# Patient Record
Sex: Female | Born: 1955
Health system: Southern US, Community
[De-identification: ages and names within clinical notes are randomized; demographics above are authoritative.]

## PROBLEM LIST (undated history)

## (undated) DIAGNOSIS — M545 Low back pain, unspecified: Secondary | ICD-10-CM

## (undated) DIAGNOSIS — H269 Unspecified cataract: Secondary | ICD-10-CM

## (undated) DIAGNOSIS — R519 Headache, unspecified: Secondary | ICD-10-CM

## (undated) DIAGNOSIS — F40240 Claustrophobia: Secondary | ICD-10-CM

## (undated) DIAGNOSIS — R7303 Prediabetes: Secondary | ICD-10-CM

## (undated) DIAGNOSIS — M1990 Unspecified osteoarthritis, unspecified site: Secondary | ICD-10-CM

## (undated) DIAGNOSIS — G8929 Other chronic pain: Secondary | ICD-10-CM

## (undated) DIAGNOSIS — I10 Essential (primary) hypertension: Secondary | ICD-10-CM

## (undated) DIAGNOSIS — E7800 Pure hypercholesterolemia, unspecified: Secondary | ICD-10-CM

## (undated) DIAGNOSIS — R011 Cardiac murmur, unspecified: Secondary | ICD-10-CM

## (undated) DIAGNOSIS — H35039 Hypertensive retinopathy, unspecified eye: Secondary | ICD-10-CM

## (undated) DIAGNOSIS — R51 Headache: Secondary | ICD-10-CM

## (undated) DIAGNOSIS — K219 Gastro-esophageal reflux disease without esophagitis: Secondary | ICD-10-CM

## (undated) HISTORY — PX: VAGINAL HYSTERECTOMY: SUR661

## (undated) HISTORY — PX: CATARACT EXTRACTION: SUR2

## (undated) HISTORY — PX: CARPAL TUNNEL RELEASE: SHX101

## (undated) HISTORY — PX: BREAST BIOPSY: SHX20

## (undated) HISTORY — PX: BREAST EXCISIONAL BIOPSY: SUR124

## (undated) HISTORY — PX: BREAST CYST EXCISION: SHX579

## (undated) HISTORY — PX: COLONOSCOPY W/ BIOPSIES AND POLYPECTOMY: SHX1376

## (undated) HISTORY — DX: Hypertensive retinopathy, unspecified eye: H35.039

---

## 1998-03-22 ENCOUNTER — Encounter: Admission: RE | Admit: 1998-03-22 | Discharge: 1998-03-22 | Payer: Self-pay | Admitting: Sports Medicine

## 1998-06-06 ENCOUNTER — Encounter: Admission: RE | Admit: 1998-06-06 | Discharge: 1998-06-06 | Payer: Self-pay | Admitting: Family Medicine

## 2000-12-31 ENCOUNTER — Encounter: Admission: RE | Admit: 2000-12-31 | Discharge: 2000-12-31 | Payer: Self-pay | Admitting: Family Medicine

## 2001-07-23 ENCOUNTER — Ambulatory Visit (HOSPITAL_COMMUNITY): Admission: RE | Admit: 2001-07-23 | Discharge: 2001-07-23 | Payer: Self-pay

## 2002-06-12 ENCOUNTER — Emergency Department (HOSPITAL_COMMUNITY): Admission: EM | Admit: 2002-06-12 | Discharge: 2002-06-12 | Payer: Self-pay | Admitting: *Deleted

## 2003-01-24 ENCOUNTER — Emergency Department (HOSPITAL_COMMUNITY): Admission: EM | Admit: 2003-01-24 | Discharge: 2003-01-24 | Payer: Self-pay | Admitting: *Deleted

## 2003-01-24 ENCOUNTER — Encounter: Payer: Self-pay | Admitting: *Deleted

## 2003-03-24 ENCOUNTER — Emergency Department (HOSPITAL_COMMUNITY): Admission: EM | Admit: 2003-03-24 | Discharge: 2003-03-24 | Payer: Self-pay | Admitting: Emergency Medicine

## 2003-08-28 ENCOUNTER — Encounter: Admission: RE | Admit: 2003-08-28 | Discharge: 2003-08-28 | Payer: Self-pay | Admitting: Family Medicine

## 2003-10-11 ENCOUNTER — Encounter: Admission: RE | Admit: 2003-10-11 | Discharge: 2003-10-11 | Payer: Self-pay | Admitting: Family Medicine

## 2003-10-16 ENCOUNTER — Encounter: Admission: RE | Admit: 2003-10-16 | Discharge: 2003-10-16 | Payer: Self-pay | Admitting: Sports Medicine

## 2003-11-30 ENCOUNTER — Emergency Department (HOSPITAL_COMMUNITY): Admission: EM | Admit: 2003-11-30 | Discharge: 2003-11-30 | Payer: Self-pay | Admitting: Emergency Medicine

## 2003-12-25 ENCOUNTER — Emergency Department (HOSPITAL_COMMUNITY): Admission: EM | Admit: 2003-12-25 | Discharge: 2003-12-25 | Payer: Self-pay | Admitting: Emergency Medicine

## 2004-03-10 ENCOUNTER — Emergency Department (HOSPITAL_COMMUNITY): Admission: EM | Admit: 2004-03-10 | Discharge: 2004-03-10 | Payer: Self-pay | Admitting: Emergency Medicine

## 2004-07-20 ENCOUNTER — Encounter: Admission: RE | Admit: 2004-07-20 | Discharge: 2004-07-20 | Payer: Self-pay | Admitting: Orthopedic Surgery

## 2004-07-21 ENCOUNTER — Emergency Department (HOSPITAL_COMMUNITY): Admission: EM | Admit: 2004-07-21 | Discharge: 2004-07-21 | Payer: Self-pay | Admitting: Emergency Medicine

## 2004-08-26 ENCOUNTER — Encounter: Admission: RE | Admit: 2004-08-26 | Discharge: 2004-09-27 | Payer: Self-pay | Admitting: Neurosurgery

## 2004-09-12 ENCOUNTER — Ambulatory Visit: Payer: Self-pay | Admitting: Family Medicine

## 2004-10-06 ENCOUNTER — Encounter (INDEPENDENT_AMBULATORY_CARE_PROVIDER_SITE_OTHER): Payer: Self-pay | Admitting: *Deleted

## 2004-10-08 ENCOUNTER — Ambulatory Visit: Payer: Self-pay | Admitting: Family Medicine

## 2004-10-08 ENCOUNTER — Ambulatory Visit (HOSPITAL_COMMUNITY): Admission: RE | Admit: 2004-10-08 | Discharge: 2004-10-08 | Payer: Self-pay | Admitting: Family Medicine

## 2004-11-11 ENCOUNTER — Ambulatory Visit: Payer: Self-pay | Admitting: Family Medicine

## 2004-11-20 ENCOUNTER — Ambulatory Visit (HOSPITAL_COMMUNITY): Admission: RE | Admit: 2004-11-20 | Discharge: 2004-11-20 | Payer: Self-pay | Admitting: Internal Medicine

## 2004-12-20 ENCOUNTER — Ambulatory Visit: Payer: Self-pay | Admitting: Internal Medicine

## 2005-05-01 ENCOUNTER — Ambulatory Visit: Payer: Self-pay | Admitting: Family Medicine

## 2005-05-07 ENCOUNTER — Encounter: Admission: RE | Admit: 2005-05-07 | Discharge: 2005-05-07 | Payer: Self-pay | Admitting: Sports Medicine

## 2005-08-07 ENCOUNTER — Ambulatory Visit (HOSPITAL_COMMUNITY): Admission: RE | Admit: 2005-08-07 | Discharge: 2005-08-07 | Payer: Self-pay | Admitting: Family Medicine

## 2005-08-07 ENCOUNTER — Ambulatory Visit: Payer: Self-pay | Admitting: Family Medicine

## 2005-09-01 ENCOUNTER — Ambulatory Visit: Payer: Self-pay | Admitting: Family Medicine

## 2005-10-13 ENCOUNTER — Encounter: Admission: RE | Admit: 2005-10-13 | Discharge: 2005-10-13 | Payer: Self-pay | Admitting: Sports Medicine

## 2005-12-23 ENCOUNTER — Ambulatory Visit: Payer: Self-pay | Admitting: Family Medicine

## 2006-04-29 ENCOUNTER — Ambulatory Visit: Payer: Self-pay | Admitting: Sports Medicine

## 2006-12-03 DIAGNOSIS — N951 Menopausal and female climacteric states: Secondary | ICD-10-CM

## 2006-12-03 DIAGNOSIS — E7800 Pure hypercholesterolemia, unspecified: Secondary | ICD-10-CM | POA: Insufficient documentation

## 2006-12-03 DIAGNOSIS — M545 Low back pain: Secondary | ICD-10-CM

## 2006-12-03 DIAGNOSIS — I10 Essential (primary) hypertension: Secondary | ICD-10-CM

## 2006-12-03 DIAGNOSIS — F339 Major depressive disorder, recurrent, unspecified: Secondary | ICD-10-CM | POA: Insufficient documentation

## 2006-12-04 ENCOUNTER — Encounter (INDEPENDENT_AMBULATORY_CARE_PROVIDER_SITE_OTHER): Payer: Self-pay | Admitting: *Deleted

## 2007-02-25 ENCOUNTER — Encounter: Admission: RE | Admit: 2007-02-25 | Discharge: 2007-02-25 | Payer: Self-pay | Admitting: Internal Medicine

## 2007-05-07 HISTORY — PX: COLON SURGERY: SHX602

## 2007-05-26 ENCOUNTER — Inpatient Hospital Stay (HOSPITAL_COMMUNITY): Admission: RE | Admit: 2007-05-26 | Discharge: 2007-05-31 | Payer: Self-pay | Admitting: General Surgery

## 2007-05-26 ENCOUNTER — Encounter (HOSPITAL_BASED_OUTPATIENT_CLINIC_OR_DEPARTMENT_OTHER): Payer: Self-pay | Admitting: General Surgery

## 2008-05-24 ENCOUNTER — Encounter: Admission: RE | Admit: 2008-05-24 | Discharge: 2008-05-24 | Payer: Self-pay | Admitting: Internal Medicine

## 2009-02-06 ENCOUNTER — Ambulatory Visit (HOSPITAL_COMMUNITY): Admission: RE | Admit: 2009-02-06 | Discharge: 2009-02-06 | Payer: Self-pay | Admitting: Neurosurgery

## 2009-06-07 ENCOUNTER — Encounter: Admission: RE | Admit: 2009-06-07 | Discharge: 2009-06-07 | Payer: Self-pay | Admitting: Internal Medicine

## 2010-06-24 ENCOUNTER — Encounter: Admission: RE | Admit: 2010-06-24 | Discharge: 2010-06-24 | Payer: Self-pay | Admitting: Internal Medicine

## 2010-12-23 ENCOUNTER — Emergency Department (HOSPITAL_COMMUNITY): Payer: Medicare Other

## 2010-12-23 ENCOUNTER — Emergency Department (HOSPITAL_COMMUNITY)
Admission: EM | Admit: 2010-12-23 | Discharge: 2010-12-23 | Disposition: A | Discharge status: Home / Self Care | Payer: Medicare Other | Attending: Emergency Medicine | Admitting: Emergency Medicine

## 2010-12-23 DIAGNOSIS — K219 Gastro-esophageal reflux disease without esophagitis: Secondary | ICD-10-CM | POA: Insufficient documentation

## 2010-12-23 DIAGNOSIS — I10 Essential (primary) hypertension: Secondary | ICD-10-CM | POA: Insufficient documentation

## 2010-12-23 DIAGNOSIS — Z79899 Other long term (current) drug therapy: Secondary | ICD-10-CM | POA: Insufficient documentation

## 2010-12-23 DIAGNOSIS — M25519 Pain in unspecified shoulder: Secondary | ICD-10-CM | POA: Insufficient documentation

## 2010-12-23 DIAGNOSIS — M79609 Pain in unspecified limb: Secondary | ICD-10-CM | POA: Insufficient documentation

## 2010-12-23 DIAGNOSIS — F329 Major depressive disorder, single episode, unspecified: Secondary | ICD-10-CM | POA: Insufficient documentation

## 2010-12-23 DIAGNOSIS — M2560 Stiffness of unspecified joint, not elsewhere classified: Secondary | ICD-10-CM | POA: Insufficient documentation

## 2010-12-23 DIAGNOSIS — F3289 Other specified depressive episodes: Secondary | ICD-10-CM | POA: Insufficient documentation

## 2011-01-15 LAB — BASIC METABOLIC PANEL
BUN: 9 mg/dL (ref 6–23)
CO2: 28 mEq/L (ref 19–32)
Creatinine, Ser: 0.61 mg/dL (ref 0.4–1.2)
GFR calc non Af Amer: >60 mL/min (ref >60)

## 2011-01-15 LAB — CBC
HCT: 38 % (ref 36.0–46.0)
Platelets: 291 10*3/uL (ref 150–400)
RBC: 3.93 MIL/uL (ref 3.87–5.11)

## 2011-02-18 NOTE — Op Note (Signed)
Annette Rios, Annette Rios              ACCOUNT NO.:  [id number]   MEDICAL RECORD NO.:  [id number]          PATIENT TYPE:  AMB   LOCATION:  SDS                          FACILITY:  [facility]   PHYSICIAN:  Danae Orleans. Venetia Maxon, M.D.  DATE OF BIRTH:  1955/10/24   DATE OF PROCEDURE:  02/06/2009  DATE OF DISCHARGE:                               OPERATIVE REPORT   PREOPERATIVE DIAGNOSIS:  Right carpal tunnel syndrome.   POSTOPERATIVE DIAGNOSIS:  Right carpal tunnel syndrome.   PROCEDURE:  Right carpal tunnel release.   SURGEON:  Danae Orleans. Venetia Maxon, MD   ANESTHESIA:  Laryngeal mask anesthesia with local lidocaine.   ESTIMATED BLOOD LOSS:  Minimal.   COMPLICATIONS:  None.   DISPOSITION:  Recovery.   INDICATIONS:  [name] is a 55 year old woman with EMG and nerve  conduction velocity documented carpal tunnel syndrome on the right.  It  was elected to take her to Surgery for carpal tunnel release following  failure to improve with conservative measures.   PROCEDURE IN DETAIL:  [name] was brought to the operating room.  She was placed under LMA anesthesia per her own request.  I had  recommended local lidocaine and intravenous sedation.  Following  satisfactory and uncomplicated induction of general anesthesia, the  patient's right arm and hand were prepped and draped in the usual  sterile fashion with Betadine scrub and paint and sterile stockinette  was applied with extremity drape.  Her right incision was marked on the  right volar wrist at the level of the fourth ray to the most distal  wrist crease over a length of 2 cm.  Skin and subcutaneous tissues were  infiltrated with local lidocaine.  Incision was made and carried through  subcutaneous fat to expose the flexor retinaculum.  This was incised  sharply with #15 blade and then incised distally into the palm with  tenotomy scissors and then more proximally into the volar wrist and it  was felt to be well-decompressed.  The wound  was then irrigated.  Soft  tissue and neural elements were inspected and found to be indeed  repaired.  The incision was closed with interrupted 3-0 vertical  mattress nylon  stitches dressed with bacitracin, Telfa, Kerlix fluff, Kerlix and Kling  wrap and the patient was extubated in the operating room and taken to  the recovery in stable and satisfactory condition having tolerated her  operation well.  Counts were correct at the end of the case.      Danae Orleans. Venetia Maxon, M.D.  Electronically Signed     JDS/MEDQ  D:  02/06/2009  T:  02/06/2009  Job:  [id number]

## 2011-02-18 NOTE — Op Note (Signed)
Annette Rios, Annette Rios              ACCOUNT NO.:  [id number]   MEDICAL RECORD NO.:  [id number]          PATIENT TYPE:  INP   LOCATION:  5706                         FACILITY:  [facility]   PHYSICIAN:  Leonie Man, M.D.   DATE OF BIRTH:  March 09, 1956   DATE OF PROCEDURE:  05/26/2007  DATE OF DISCHARGE:                               OPERATIVE REPORT   PREOPERATIVE DIAGNOSIS:  Polyp of midascending colon incompletely  excised, rule out carcinoma.   POSTOPERATIVE DIAGNOSIS:  Polyp of midascending colon incompletely  excised, rule out carcinoma.   PROCEDURE:  Right hemicolectomy.   SURGEON:  Leonie Man, M.D.   ASSISTANT:  Angelia Mould. Derrell Lolling, M.D.   ANESTHESIA:  General.   SPECIMENS TO LAB:  Right colon and distal ileum.   ESTIMATED BLOOD LOSS:  Minimal.   COMPLICATIONS:  None apparent.   The patient removed to the PACU in excellent condition.   NOTE:  The patient is a 55 year old female presenting with severe  history of constipation.  She underwent screening colonoscopy.  At that  time she was noted to have three areas of polypoid formation in the  right colon, all of which were biopsied and noted to be adenomatous  polyps; however, in discussion with the endoscopists, one of these  polyps was incompletely excised and the patient is referred for surgical  evaluation.  The patient comes to the operating room now after the risks  and potential benefits of right hemicolectomy have been fully discussed.  All questions answered and consent obtained for surgery.   IDENTIFYING INFORMATION:  Following induction of satisfactory anesthesia  with the patient positioned supinely, the abdomen is prepped and draped  to be included in a sterile operative field and a Foley catheter was  placed in the urinary bladder.  The patient is positively identified as  Annette Rios and the operation to be performed right hemicolectomy.   A transverse incision is made at the level of the umbilicus,  deepened  through skin and subcutaneous tissues across the anterior rectus fascia.  The rectus muscle was divided and the posterior rectus fascia and the  posterior rectus sheath and peritoneum were opened.  Exploration of the  abdomen carried out.  There was no evidence of seeding in any portion of  the abdomen.  The liver was smooth and normal.  The area of polypoid  malformation had been previously tattooed with [place] ink and could be  clearly seen.  Dissection carried down in the region of the cecum,  releasing the retroperitoneal attachments of both the cecum and the  distal ileum and carrying this dissection up along the right paracolic  gutter up to the hepatic flexure.  The hepatic flexure was taken down  with the LigaSure stapling device and carried all the way to the mid  middle colic vessels.  The entire right colonic mesentery was mobilized  from the retroperitoneum protecting the duodenum, the ureter and the  kidney.  Both the distal ileum was transected and just proximal to the  fold of Treves and the mid transverse colon was divided just proximal to  the  middle colic vessels.  This was done with a GIA stapling device and  the intervening mesentery was then taken with the LigaSure device  carried down to the ileocolic vessels which were secured with 2-0 silk  ties.  Entire specimen was then removed and forwarded for pathologic  evaluation.  A functional end-to-end anastomosis was then carried out  between the distal ileum and the mid transverse colon using a GIA  stapler.  The staple line noted to be intact.  The anastomosis was  closed with a TA60 stapling device.  The mesenteric defect was closed  with interrupted 3-0 Vicryl sutures.  All areas of dissection were then  checked for hemostasis noted to be dry.  Sponge and instrument and sharp  counts were doubly verified and the wound closed in layers as follows.  The posterior rectus sheath and peritoneum closed with running  #0  Novofil suture.  The anterior rectus sheath and midline closed with a  running double-stranded #1 Novofil suture.  Subcutaneous tissues are  irrigated and the skin closed with staples.  Sterile dressings were  applied.  The anesthetic reversed and the patient removed from the  operating room to the recovery room in stable condition.  She tolerated  the procedure well.      Leonie Man, M.D.  Electronically Signed     PB/MEDQ  D:  05/26/2007  T:  05/27/2007  Job:  [id number]

## 2011-02-18 NOTE — Discharge Summary (Signed)
NAMECHALLIS, CRILL              ACCOUNT NO.:  [id number]   MEDICAL RECORD NO.:  [id number]          PATIENT TYPE:  INP   LOCATION:  5706                         FACILITY:  [facility]   PHYSICIAN:  Leonie Man, M.D.   DATE OF BIRTH:  December 09, 1955   DATE OF ADMISSION:  05/26/2007  DATE OF DISCHARGE:                               DISCHARGE SUMMARY   ADMISSION DIAGNOSIS:  Incompletely excised tubulovillous adenoma of the  mid ascending colon.   DISCHARGE DIAGNOSIS:  Incompletely excised tubulovillous adenoma of the  mid ascending colon.   PROCEDURES IN-HOSPITAL:  Right hemicolectomy with ileocolic anastomosis.   COMPLICATIONS:  None.   CONDITION ON DISCHARGE:  Improved.   HISTORY AND HOSPITAL COURSE:  The patient is a 55 year old female  presenting originally with constipation.  She underwent a colonoscopy  and found to have three areas of tubulovillous adenoma in the ascending  colon.  These were excised.  The largest one in the mid ascending colon  was felt to be incompletely excised.  The patient was subsequently  referred for definitive surgery to rule out carcinoma.  On preoperative  evaluation, the patient noted to have fairly well-controlled  hypertension.  She has a history of depression which was controlled on  SSRIs, problems with low-back pain on Flexeril.  On the day of admission  she went to the operating room where she underwent right-sided  hemicolectomy.  Her postoperative course has been benign with normal  resumption of diet and activity.  Today on the day of discharge she is  feeling well.  She is tolerating a regular diet and having normal bowel  activity.  Her incision is healing satisfactorily and the staples were  removed and the wound was steri-stripped on the day of discharge.  She  is now being discharged to be followed up in the office in 2-3 weeks.   DISCHARGE MEDICATION:  Percocet 10/650 one to two every 4-6 hours p.r.n.   The patient is advised to  continue her usual home medications which  include:  1. Hydrochlorothiazide.  2. Fluoxetine.  3. Nexium,.  4. Low-dose aspirin.  5. Cymbalta.  6. Lipitor.  7. Celebrex.  8. Flexeril.   ACTIVITY:  As tolerated.  She is asked not to lift any weight heavier  than 20 pounds for the next 6 weeks.   DIET:  Unrestricted.      Leonie Man, M.D.  Electronically Signed     PB/MEDQ  D:  05/31/2007  T:  05/31/2007  Job:  [id number]

## 2011-06-25 ENCOUNTER — Other Ambulatory Visit: Payer: Self-pay | Admitting: Internal Medicine

## 2011-06-25 DIAGNOSIS — Z1231 Encounter for screening mammogram for malignant neoplasm of breast: Secondary | ICD-10-CM

## 2011-07-03 ENCOUNTER — Ambulatory Visit
Admission: RE | Admit: 2011-07-03 | Discharge: 2011-07-03 | Disposition: A | Discharge status: Home / Self Care | Payer: Medicaid Other | Source: Ambulatory Visit | Attending: Internal Medicine | Admitting: Internal Medicine

## 2011-07-03 DIAGNOSIS — Z1231 Encounter for screening mammogram for malignant neoplasm of breast: Secondary | ICD-10-CM

## 2011-07-18 LAB — CEA: CEA: <0.5

## 2011-07-18 LAB — DIFFERENTIAL
Basophils Absolute: 0
Eosinophils Absolute: 0
Eosinophils Relative: 0
Lymphocytes Relative: 36
Lymphs Abs: 2.6
Monocytes Absolute: 0.3
Monocytes Relative: 5
Neutro Abs: 4.2

## 2011-07-18 LAB — BASIC METABOLIC PANEL
BUN: 3 — ABNORMAL LOW
CO2: 28
Calcium: 8 — ABNORMAL LOW
Calcium: 8.2 — ABNORMAL LOW
Calcium: 8.6
Chloride: 100
Creatinine, Ser: 0.56
GFR calc non Af Amer: >60
GFR calc non Af Amer: >60
Glucose, Bld: 108 — ABNORMAL HIGH
Glucose, Bld: 99
Potassium: 3 — ABNORMAL LOW
Potassium: 3.6
Sodium: 132 — ABNORMAL LOW
Sodium: 135
Sodium: 137

## 2011-07-18 LAB — CBC
HCT: 31.8 — ABNORMAL LOW
Hemoglobin: 10.9 — ABNORMAL LOW
Hemoglobin: 11 — ABNORMAL LOW
MCHC: 34
MCHC: 34
MCHC: 34.3
MCHC: 34.6
Platelets: 308
Platelets: 311
RDW: 13.9
WBC: 11.7 — ABNORMAL HIGH

## 2011-07-18 LAB — COMPREHENSIVE METABOLIC PANEL
AST: 19
Albumin: 3.2 — ABNORMAL LOW
Alkaline Phosphatase: 38 — ABNORMAL LOW
BUN: 4 — ABNORMAL LOW
BUN: 6
Calcium: 9.6
Chloride: 100
Chloride: 103
Sodium: 136

## 2011-07-18 LAB — PROTIME-INR
INR: 0.9
Prothrombin Time: 12.8

## 2011-07-18 LAB — ABO/RH: ABO/RH(D): O POS

## 2011-07-18 LAB — TYPE AND SCREEN: Antibody Screen: NEGATIVE

## 2011-10-29 DIAGNOSIS — E785 Hyperlipidemia, unspecified: Secondary | ICD-10-CM | POA: Diagnosis not present

## 2011-10-29 DIAGNOSIS — Z131 Encounter for screening for diabetes mellitus: Secondary | ICD-10-CM | POA: Diagnosis not present

## 2011-10-29 DIAGNOSIS — M67919 Unspecified disorder of synovium and tendon, unspecified shoulder: Secondary | ICD-10-CM | POA: Diagnosis not present

## 2011-10-29 DIAGNOSIS — I10 Essential (primary) hypertension: Secondary | ICD-10-CM | POA: Diagnosis not present

## 2011-11-12 DIAGNOSIS — F330 Major depressive disorder, recurrent, mild: Secondary | ICD-10-CM | POA: Diagnosis not present

## 2012-02-11 DIAGNOSIS — Z862 Personal history of diseases of the blood and blood-forming organs and certain disorders involving the immune mechanism: Secondary | ICD-10-CM | POA: Diagnosis not present

## 2012-02-11 DIAGNOSIS — R7309 Other abnormal glucose: Secondary | ICD-10-CM | POA: Diagnosis not present

## 2012-02-11 DIAGNOSIS — Z8639 Personal history of other endocrine, nutritional and metabolic disease: Secondary | ICD-10-CM | POA: Diagnosis not present

## 2012-02-11 DIAGNOSIS — I10 Essential (primary) hypertension: Secondary | ICD-10-CM | POA: Diagnosis not present

## 2012-02-11 DIAGNOSIS — E785 Hyperlipidemia, unspecified: Secondary | ICD-10-CM | POA: Diagnosis not present

## 2012-03-24 DIAGNOSIS — F330 Major depressive disorder, recurrent, mild: Secondary | ICD-10-CM | POA: Diagnosis not present

## 2012-05-17 DIAGNOSIS — I10 Essential (primary) hypertension: Secondary | ICD-10-CM | POA: Diagnosis not present

## 2012-05-17 DIAGNOSIS — E785 Hyperlipidemia, unspecified: Secondary | ICD-10-CM | POA: Diagnosis not present

## 2012-05-17 DIAGNOSIS — R7309 Other abnormal glucose: Secondary | ICD-10-CM | POA: Diagnosis not present

## 2012-05-17 DIAGNOSIS — Z862 Personal history of diseases of the blood and blood-forming organs and certain disorders involving the immune mechanism: Secondary | ICD-10-CM | POA: Diagnosis not present

## 2012-07-22 ENCOUNTER — Other Ambulatory Visit: Payer: Self-pay | Admitting: Internal Medicine

## 2012-07-22 DIAGNOSIS — Z1231 Encounter for screening mammogram for malignant neoplasm of breast: Secondary | ICD-10-CM

## 2012-08-18 ENCOUNTER — Ambulatory Visit
Admission: RE | Admit: 2012-08-18 | Discharge: 2012-08-18 | Disposition: A | Discharge status: Home / Self Care | Payer: Medicare Other | Source: Ambulatory Visit | Attending: Internal Medicine | Admitting: Internal Medicine

## 2012-08-18 DIAGNOSIS — Z8639 Personal history of other endocrine, nutritional and metabolic disease: Secondary | ICD-10-CM | POA: Diagnosis not present

## 2012-08-18 DIAGNOSIS — Z862 Personal history of diseases of the blood and blood-forming organs and certain disorders involving the immune mechanism: Secondary | ICD-10-CM | POA: Diagnosis not present

## 2012-08-18 DIAGNOSIS — E785 Hyperlipidemia, unspecified: Secondary | ICD-10-CM | POA: Diagnosis not present

## 2012-08-18 DIAGNOSIS — Z1231 Encounter for screening mammogram for malignant neoplasm of breast: Secondary | ICD-10-CM

## 2012-08-18 DIAGNOSIS — I10 Essential (primary) hypertension: Secondary | ICD-10-CM | POA: Diagnosis not present

## 2012-08-18 DIAGNOSIS — K219 Gastro-esophageal reflux disease without esophagitis: Secondary | ICD-10-CM | POA: Diagnosis not present

## 2012-11-17 DIAGNOSIS — R7309 Other abnormal glucose: Secondary | ICD-10-CM | POA: Diagnosis not present

## 2012-11-17 DIAGNOSIS — I10 Essential (primary) hypertension: Secondary | ICD-10-CM | POA: Diagnosis not present

## 2012-11-17 DIAGNOSIS — K219 Gastro-esophageal reflux disease without esophagitis: Secondary | ICD-10-CM | POA: Diagnosis not present

## 2012-11-17 DIAGNOSIS — E785 Hyperlipidemia, unspecified: Secondary | ICD-10-CM | POA: Diagnosis not present

## 2013-03-21 DIAGNOSIS — I10 Essential (primary) hypertension: Secondary | ICD-10-CM | POA: Diagnosis not present

## 2013-03-21 DIAGNOSIS — K219 Gastro-esophageal reflux disease without esophagitis: Secondary | ICD-10-CM | POA: Diagnosis not present

## 2013-03-21 DIAGNOSIS — R7309 Other abnormal glucose: Secondary | ICD-10-CM | POA: Diagnosis not present

## 2013-03-21 DIAGNOSIS — E785 Hyperlipidemia, unspecified: Secondary | ICD-10-CM | POA: Diagnosis not present

## 2013-05-11 DIAGNOSIS — Z86010 Personal history of colonic polyps: Secondary | ICD-10-CM | POA: Diagnosis not present

## 2013-05-27 DIAGNOSIS — Z86010 Personal history of colonic polyps: Secondary | ICD-10-CM | POA: Diagnosis not present

## 2013-05-27 DIAGNOSIS — D126 Benign neoplasm of colon, unspecified: Secondary | ICD-10-CM | POA: Diagnosis not present

## 2013-05-27 DIAGNOSIS — K649 Unspecified hemorrhoids: Secondary | ICD-10-CM | POA: Diagnosis not present

## 2013-05-27 DIAGNOSIS — Z1211 Encounter for screening for malignant neoplasm of colon: Secondary | ICD-10-CM | POA: Diagnosis not present

## 2013-09-19 DIAGNOSIS — K219 Gastro-esophageal reflux disease without esophagitis: Secondary | ICD-10-CM | POA: Diagnosis not present

## 2013-09-19 DIAGNOSIS — I10 Essential (primary) hypertension: Secondary | ICD-10-CM | POA: Diagnosis not present

## 2013-09-19 DIAGNOSIS — M5126 Other intervertebral disc displacement, lumbar region: Secondary | ICD-10-CM | POA: Diagnosis not present

## 2013-09-19 DIAGNOSIS — R7309 Other abnormal glucose: Secondary | ICD-10-CM | POA: Diagnosis not present

## 2013-09-20 ENCOUNTER — Other Ambulatory Visit: Payer: Self-pay

## 2013-09-20 DIAGNOSIS — Z1231 Encounter for screening mammogram for malignant neoplasm of breast: Secondary | ICD-10-CM

## 2013-10-03 ENCOUNTER — Ambulatory Visit: Payer: Medicare Other

## 2013-10-04 ENCOUNTER — Other Ambulatory Visit (HOSPITAL_COMMUNITY): Payer: Self-pay | Admitting: Internal Medicine

## 2013-10-04 ENCOUNTER — Ambulatory Visit (HOSPITAL_COMMUNITY)
Admission: RE | Admit: 2013-10-04 | Discharge: 2013-10-04 | Disposition: A | Discharge status: Home / Self Care | Payer: Medicare Other | Source: Ambulatory Visit | Attending: Internal Medicine | Admitting: Internal Medicine

## 2013-10-04 DIAGNOSIS — M51379 Other intervertebral disc degeneration, lumbosacral region without mention of lumbar back pain or lower extremity pain: Secondary | ICD-10-CM | POA: Insufficient documentation

## 2013-10-04 DIAGNOSIS — M948X9 Other specified disorders of cartilage, unspecified sites: Secondary | ICD-10-CM | POA: Insufficient documentation

## 2013-10-04 DIAGNOSIS — M79609 Pain in unspecified limb: Secondary | ICD-10-CM | POA: Insufficient documentation

## 2013-10-04 DIAGNOSIS — M5126 Other intervertebral disc displacement, lumbar region: Secondary | ICD-10-CM

## 2013-10-04 DIAGNOSIS — M5380 Other specified dorsopathies, site unspecified: Secondary | ICD-10-CM | POA: Insufficient documentation

## 2013-10-04 DIAGNOSIS — M5137 Other intervertebral disc degeneration, lumbosacral region: Secondary | ICD-10-CM | POA: Diagnosis not present

## 2013-10-27 ENCOUNTER — Ambulatory Visit
Admission: RE | Admit: 2013-10-27 | Discharge: 2013-10-27 | Disposition: A | Discharge status: Home / Self Care | Payer: Medicare Other | Source: Ambulatory Visit

## 2013-10-27 DIAGNOSIS — Z1231 Encounter for screening mammogram for malignant neoplasm of breast: Secondary | ICD-10-CM

## 2013-12-19 DIAGNOSIS — M5126 Other intervertebral disc displacement, lumbar region: Secondary | ICD-10-CM | POA: Diagnosis not present

## 2013-12-19 DIAGNOSIS — E559 Vitamin D deficiency, unspecified: Secondary | ICD-10-CM | POA: Diagnosis not present

## 2013-12-19 DIAGNOSIS — I10 Essential (primary) hypertension: Secondary | ICD-10-CM | POA: Diagnosis not present

## 2013-12-19 DIAGNOSIS — R7309 Other abnormal glucose: Secondary | ICD-10-CM | POA: Diagnosis not present

## 2013-12-19 DIAGNOSIS — E785 Hyperlipidemia, unspecified: Secondary | ICD-10-CM | POA: Diagnosis not present

## 2014-02-09 ENCOUNTER — Telehealth: Payer: Self-pay | Admitting: *Deleted

## 2014-02-09 NOTE — Telephone Encounter (Signed)
Want to ask some questions.  I appreciate it very much.  Give me a call.  I returned her call.  I have some calluses on my feet.  How do you all treat this.  I told her conservatively we trim them.  She stated is that it?  I informed her it's a bone deformity that has friction with the shoe that causes the callus to develop.  Some time a surgical procedure can be performed.  Patient asked for an appointment.  I transferred her to a scheduler.

## 2014-02-10 ENCOUNTER — Encounter (HOSPITAL_COMMUNITY): Payer: Self-pay | Admitting: Emergency Medicine

## 2014-02-10 ENCOUNTER — Emergency Department (INDEPENDENT_AMBULATORY_CARE_PROVIDER_SITE_OTHER)
Admission: EM | Admit: 2014-02-10 | Discharge: 2014-02-10 | Disposition: A | Discharge status: Home / Self Care | Payer: Medicare Other | Source: Home / Self Care | Attending: Family Medicine | Admitting: Family Medicine

## 2014-02-10 DIAGNOSIS — S90569A Insect bite (nonvenomous), unspecified ankle, initial encounter: Secondary | ICD-10-CM

## 2014-02-10 DIAGNOSIS — W57XXXA Bitten or stung by nonvenomous insect and other nonvenomous arthropods, initial encounter: Principal | ICD-10-CM

## 2014-02-10 DIAGNOSIS — S80869A Insect bite (nonvenomous), unspecified lower leg, initial encounter: Secondary | ICD-10-CM

## 2014-02-10 HISTORY — DX: Essential (primary) hypertension: I10

## 2014-02-10 MED ORDER — MUPIROCIN CALCIUM 2 % EX CREA: 1.0000 "application " | TOPICAL_CREAM | Freq: Two times a day (BID) | CUTANEOUS | Status: DC

## 2014-02-10 MED ORDER — CLOBETASOL PROPIONATE 0.05 % EX CREA: 1.0000 "application " | TOPICAL_CREAM | Freq: Two times a day (BID) | CUTANEOUS | Status: DC

## 2014-02-10 NOTE — ED Provider Notes (Signed)
CSN: [id number]     Arrival date & time 02/10/14  0805 History   First MD Initiated Contact with Patient 02/10/14 [phone]     Chief Complaint  Patient presents with  . Insect Bite   (Consider location/radiation/quality/duration/timing/severity/associated sxs/prior Treatment) HPI Comments: 58 year old female presents complaining of possible spider bite on her lateral left leg. She was bitten by something 2 weeks ago. She then saw a spider in her sheets. She had a small amount of bleeding from the area of the bite at that time. Since then, she has had a small area of tender redness it doesn't seem to be getting better now 2 weeks later. She denies any systemic symptoms at this time.   Past Medical History  Diagnosis Date  . Hypertension    History reviewed. No pertinent past surgical history. No family history on file. History  Substance Use Topics  . Smoking status: Never Smoker   . Smokeless tobacco: Not on file  . Alcohol Use: No   OB History   Grav Para Term Preterm Abortions TAB SAB Ect Mult Living                 Review of Systems  Skin: Positive for color change and wound.       See HPI  All other systems reviewed and are negative.   Allergies  Review of patient's allergies indicates no known allergies.  Home Medications   Prior to Admission medications   Medication Sig Start Date End Date Taking? Authorizing Provider  aspirin 81 MG tablet Take 81 mg by mouth daily.   Yes Historical Provider, MD  calcium carbonate (TUMS - DOSED IN MG ELEMENTAL CALCIUM) 500 MG chewable tablet Chew 1 tablet by mouth daily.   Yes Historical Provider, MD  esomeprazole (NEXIUM) 40 MG capsule Take 40 mg by mouth daily at 12 noon.   Yes Historical Provider, MD  clobetasol cream (TEMOVATE) 1.66 % Apply 1 application topically 2 (two) times daily. 02/10/14   Liam Graham, PA-C  mupirocin cream (BACTROBAN) 2 % Apply 1 application topically 2 (two) times daily. 02/10/14   Freeman Caldron Brynn Reznik, PA-C    BP 122/71  Pulse 67  Temp(Src) 98.3 F (36.8 C) (Oral)  Resp 18  SpO2 99% Physical Exam  Nursing note and vitals reviewed. Constitutional: She is oriented to person, place, and time. Vital signs are normal. She appears well-developed and well-nourished. No distress.  HENT:  Head: Normocephalic and atraumatic.  Pulmonary/Chest: Effort normal. No respiratory distress.  Musculoskeletal:       Legs: Neurological: She is alert and oriented to person, place, and time. She has normal strength. Coordination normal.  Skin: Skin is warm and dry. No rash noted. She is not diaphoretic.  Psychiatric: She has a normal mood and affect. Judgment normal.    ED Course  Procedures (including critical care time) Labs Review Labs Reviewed - No data to display  Imaging Review No results found.   MDM   1. Insect bite of lower leg with local reaction    Probable local reaction.  Will treat with topical steroid, also cover for infection with mupirocin.  F/u PRN   Discharge Medication List as of 02/10/2014  8:54 AM    START taking these medications   Details  clobetasol cream (TEMOVATE) 0.63 % Apply 1 application topically 2 (two) times daily., Starting 02/10/2014, Until Discontinued, Print    mupirocin cream (BACTROBAN) 2 % Apply 1 application topically 2 (two) times daily., Starting  02/10/2014, Until Discontinued, [place] Breannah Kratt, PA-C 02/10/14 [phone]

## 2014-02-10 NOTE — Discharge Instructions (Signed)
Insect Bite  Mosquitoes, flies, fleas, bedbugs, and many other insects can bite. Insect bites are different from insect stings. A sting is when venom is injected into the skin. Some insect bites can transmit infectious diseases.  SYMPTOMS   Insect bites usually turn red, swell, and itch for 2 to 4 days. They often go away on their own.  TREATMENT   Your caregiver may prescribe antibiotic medicines if a bacterial infection develops in the bite.  HOME CARE INSTRUCTIONS   Do not scratch the bite area.   Keep the bite area clean and dry. Wash the bite area thoroughly with soap and water.   Put ice or cool compresses on the bite area.   Put ice in a plastic bag.   Place a towel between your skin and the bag.   Leave the ice on for 20 minutes, 4 times a day for the first 2 to 3 days, or as directed.   You may apply a baking soda paste, cortisone cream, or calamine lotion to the bite area as directed by your caregiver. This can help reduce itching and swelling.   Only take over-the-counter or prescription medicines as directed by your caregiver.   If you are given antibiotics, take them as directed. Finish them even if you start to feel better.  You may need a tetanus shot if:   You cannot remember when you had your last tetanus shot.   You have never had a tetanus shot.   The injury broke your skin.  If you get a tetanus shot, your arm may swell, get red, and feel warm to the touch. This is common and not a problem. If you need a tetanus shot and you choose not to have one, there is a rare chance of getting tetanus. Sickness from tetanus can be serious.  SEEK IMMEDIATE MEDICAL CARE IF:    You have increased pain, redness, or swelling in the bite area.   You see a red line on the skin coming from the bite.   You have a fever.   You have joint pain.   You have a headache or neck pain.   You have unusual weakness.   You have a rash.   You have chest pain or shortness of breath.    You have abdominal pain, nausea, or vomiting.   You feel unusually tired or sleepy.  MAKE SURE YOU:    Understand these instructions.   Will watch your condition.   Will get help right away if you are not doing well or get worse.  Document Released: 10/30/2004 Document Revised: 12/15/2011 Document Reviewed: 04/23/2011  [employer] Patient Information 2014 [employer].

## 2014-02-10 NOTE — ED Notes (Signed)
Pt reports poss spider bite to lower left extremity onset 3 weeks Sx include redness (smaller than a dime), and mild pain Denies f/v/n/d Alert w/no signs of acute distress.  

## 2014-02-12 NOTE — ED Provider Notes (Signed)
Medical screening examination/treatment/procedure(s) were performed by a resident physician or non-physician practitioner and as the supervising physician I was immediately available for consultation/collaboration.  Lynne Leader, MD    Gregor Hams, MD 02/12/14 [phone]

## 2014-04-21 DIAGNOSIS — R7309 Other abnormal glucose: Secondary | ICD-10-CM | POA: Diagnosis not present

## 2014-04-21 DIAGNOSIS — M5126 Other intervertebral disc displacement, lumbar region: Secondary | ICD-10-CM | POA: Diagnosis not present

## 2014-04-21 DIAGNOSIS — E785 Hyperlipidemia, unspecified: Secondary | ICD-10-CM | POA: Diagnosis not present

## 2014-04-21 DIAGNOSIS — I10 Essential (primary) hypertension: Secondary | ICD-10-CM | POA: Diagnosis not present

## 2014-06-07 DIAGNOSIS — M47817 Spondylosis without myelopathy or radiculopathy, lumbosacral region: Secondary | ICD-10-CM | POA: Diagnosis not present

## 2014-06-07 DIAGNOSIS — M5137 Other intervertebral disc degeneration, lumbosacral region: Secondary | ICD-10-CM | POA: Diagnosis not present

## 2014-06-07 DIAGNOSIS — M545 Low back pain, unspecified: Secondary | ICD-10-CM | POA: Diagnosis not present

## 2014-06-07 DIAGNOSIS — I10 Essential (primary) hypertension: Secondary | ICD-10-CM | POA: Diagnosis not present

## 2014-06-07 DIAGNOSIS — IMO0002 Reserved for concepts with insufficient information to code with codable children: Secondary | ICD-10-CM | POA: Diagnosis not present

## 2014-06-14 ENCOUNTER — Other Ambulatory Visit: Payer: Self-pay | Admitting: Neurosurgery

## 2014-06-14 DIAGNOSIS — M5136 Other intervertebral disc degeneration, lumbar region: Secondary | ICD-10-CM

## 2014-06-16 ENCOUNTER — Ambulatory Visit
Admission: RE | Admit: 2014-06-16 | Discharge: 2014-06-16 | Disposition: A | Discharge status: Home / Self Care | Payer: Medicare Other | Source: Ambulatory Visit | Attending: Neurosurgery | Admitting: Neurosurgery

## 2014-06-16 DIAGNOSIS — M47817 Spondylosis without myelopathy or radiculopathy, lumbosacral region: Secondary | ICD-10-CM | POA: Diagnosis not present

## 2014-06-16 DIAGNOSIS — M5137 Other intervertebral disc degeneration, lumbosacral region: Secondary | ICD-10-CM | POA: Diagnosis not present

## 2014-06-16 DIAGNOSIS — M5136 Other intervertebral disc degeneration, lumbar region: Secondary | ICD-10-CM

## 2014-06-16 DIAGNOSIS — M48061 Spinal stenosis, lumbar region without neurogenic claudication: Secondary | ICD-10-CM | POA: Diagnosis not present

## 2014-07-05 DIAGNOSIS — IMO0002 Reserved for concepts with insufficient information to code with codable children: Secondary | ICD-10-CM | POA: Diagnosis not present

## 2014-07-05 DIAGNOSIS — M545 Low back pain, unspecified: Secondary | ICD-10-CM | POA: Diagnosis not present

## 2014-07-05 DIAGNOSIS — M48062 Spinal stenosis, lumbar region with neurogenic claudication: Secondary | ICD-10-CM | POA: Diagnosis not present

## 2014-07-05 DIAGNOSIS — M47817 Spondylosis without myelopathy or radiculopathy, lumbosacral region: Secondary | ICD-10-CM | POA: Diagnosis not present

## 2014-07-21 DIAGNOSIS — N760 Acute vaginitis: Secondary | ICD-10-CM | POA: Diagnosis not present

## 2014-07-21 DIAGNOSIS — R7309 Other abnormal glucose: Secondary | ICD-10-CM | POA: Diagnosis not present

## 2014-07-21 DIAGNOSIS — E784 Other hyperlipidemia: Secondary | ICD-10-CM | POA: Diagnosis not present

## 2014-07-21 DIAGNOSIS — Z124 Encounter for screening for malignant neoplasm of cervix: Secondary | ICD-10-CM | POA: Diagnosis not present

## 2014-07-21 DIAGNOSIS — I10 Essential (primary) hypertension: Secondary | ICD-10-CM | POA: Diagnosis not present

## 2014-07-28 DIAGNOSIS — M5416 Radiculopathy, lumbar region: Secondary | ICD-10-CM | POA: Diagnosis not present

## 2014-11-01 ENCOUNTER — Encounter: Payer: Self-pay | Admitting: Family Medicine

## 2014-11-01 ENCOUNTER — Ambulatory Visit (INDEPENDENT_AMBULATORY_CARE_PROVIDER_SITE_OTHER): Payer: Medicare Other | Admitting: Family Medicine

## 2014-11-01 VITALS — BP 144/73 | HR 64 | Temp 98.3°F | Ht 68.0 in | Wt 189.8 lb

## 2014-11-01 DIAGNOSIS — N951 Menopausal and female climacteric states: Secondary | ICD-10-CM | POA: Diagnosis not present

## 2014-11-01 DIAGNOSIS — R739 Hyperglycemia, unspecified: Secondary | ICD-10-CM

## 2014-11-01 DIAGNOSIS — Z86010 Personal history of colonic polyps: Secondary | ICD-10-CM

## 2014-11-01 DIAGNOSIS — K219 Gastro-esophageal reflux disease without esophagitis: Secondary | ICD-10-CM | POA: Diagnosis not present

## 2014-11-01 DIAGNOSIS — E7800 Pure hypercholesterolemia, unspecified: Secondary | ICD-10-CM

## 2014-11-01 DIAGNOSIS — I10 Essential (primary) hypertension: Secondary | ICD-10-CM

## 2014-11-01 DIAGNOSIS — M545 Low back pain: Secondary | ICD-10-CM

## 2014-11-01 DIAGNOSIS — E119 Type 2 diabetes mellitus without complications: Secondary | ICD-10-CM | POA: Insufficient documentation

## 2014-11-01 LAB — COMPREHENSIVE METABOLIC PANEL
ALBUMIN: 4.4 g/dL (ref 3.5–5.2)
ALT: 27 U/L (ref 0–35)
AST: 18 U/L (ref 0–37)
Alkaline Phosphatase: 34 U/L — ABNORMAL LOW (ref 39–117)
BUN: 11 mg/dL (ref 6–23)
CHLORIDE: 102 meq/L (ref 96–112)
CO2: 28 mEq/L (ref 19–32)
Calcium: 10 mg/dL (ref 8.4–10.5)
Creat: 0.67 mg/dL (ref 0.50–1.10)
GLUCOSE: 93 mg/dL (ref 70–99)
Potassium: 4.1 mEq/L (ref 3.5–5.3)
SODIUM: 139 meq/L (ref 135–145)
Total Bilirubin: 0.7 mg/dL (ref 0.2–1.2)
Total Protein: 7.3 g/dL (ref 6.0–8.3)

## 2014-11-01 LAB — POCT GLYCOSYLATED HEMOGLOBIN (HGB A1C): HEMOGLOBIN A1C: 6.3

## 2014-11-01 LAB — LDL CHOLESTEROL, DIRECT: Direct LDL: 192 mg/dL — ABNORMAL HIGH

## 2014-11-01 MED ORDER — OXYCODONE-ACETAMINOPHEN 5-325 MG PO TABS: ORAL_TABLET | ORAL | Status: DC

## 2014-11-01 NOTE — Assessment & Plan Note (Signed)
She reports herniated disc in her back. I'll continue her Percocet for one month and try to get some records on her back issues.

## 2014-11-01 NOTE — Assessment & Plan Note (Signed)
Currently on benazepril HCTZ. I'll continue that check some lab work today and see her back in a month.

## 2014-11-01 NOTE — Assessment & Plan Note (Signed)
She reports she's had a hysterectomy.

## 2014-11-01 NOTE — Patient Instructions (Signed)
I want to see you in a month. We may try a different kind of pain medicine next month as I would like to get you off the percocet. I will send you a note about your blood work.

## 2014-11-01 NOTE — Assessment & Plan Note (Signed)
Unclear what her real diagnosis is, whether it's hyperglycemia or early diabetes. We'll get A1c today in follow-up in one month.

## 2014-11-01 NOTE — Progress Notes (Signed)
   Subjective:    Patient ID: Annette Rios, female    DOB: [birth year], 59 y.o.   MRN: [id number]  HPI  The patient my practice here to establish care. Chronic medical issues include hypertension, history of hyperglycemia although she denies having diabetes and so she will "own it". Is not any diabetes medicines. Does not check her blood sugar regularly #2. Chronic pain syndrome from herniated disc in her back. She takes one Percocet a day. Usually takes it at night and helps her sleep. #3. Family history of some type of colon cancer. Says she's had several colonoscopies with polypectomy. #4. Uses Nexium daily for reflux symptoms and that works quite well. #5. Has allergic rhinitis and Flonase seems to help that.  Review of Systems  Constitutional: Negative for activity change, appetite change, fatigue and unexpected weight change.  HENT: Positive for rhinorrhea. Negative for ear pain and sore throat.   Eyes: Negative for pain and visual disturbance.  Respiratory: Negative for cough, shortness of breath and wheezing.   Cardiovascular: Negative for chest pain and leg swelling.  Gastrointestinal: Negative for abdominal pain, diarrhea, constipation and blood in stool.  Genitourinary: Negative for dysuria.  Musculoskeletal: Positive for back pain and arthralgias.  Skin: Negative for rash.  Neurological: Negative for syncope and weakness.  Psychiatric/Behavioral: Negative for hallucinations, behavioral problems, confusion, sleep disturbance and agitation.       Objective:   Physical Exam  Constitutional: She appears well-developed and well-nourished.  HENT:  Head: Normocephalic.  Left Ear: External ear normal.  Eyes: Conjunctivae and EOM are normal. Pupils are equal, round, and reactive to light.  Neck: Normal range of motion. Neck supple. No thyromegaly present.  Cardiovascular: Normal rate and normal heart sounds.   No murmur heard. She reports history of heart murmur but I do not  hear any murmurs today.  Pulmonary/Chest: She has no wheezes.  Abdominal: Soft. Bowel sounds are normal.  Musculoskeletal: Normal range of motion.  Neurological: She is alert.  Psychiatric: She has a normal mood and affect. Her behavior is normal. Judgment normal.          Assessment & Plan:

## 2014-11-06 ENCOUNTER — Other Ambulatory Visit: Payer: Self-pay

## 2014-11-06 DIAGNOSIS — Z1231 Encounter for screening mammogram for malignant neoplasm of breast: Secondary | ICD-10-CM

## 2014-11-08 ENCOUNTER — Encounter: Payer: Self-pay | Admitting: Family Medicine

## 2014-11-16 ENCOUNTER — Ambulatory Visit
Admission: RE | Admit: 2014-11-16 | Discharge: 2014-11-16 | Disposition: A | Discharge status: Home / Self Care | Payer: Medicare Other | Source: Ambulatory Visit

## 2014-11-16 ENCOUNTER — Ambulatory Visit: Payer: Medicare Other

## 2014-11-16 DIAGNOSIS — Z1231 Encounter for screening mammogram for malignant neoplasm of breast: Secondary | ICD-10-CM | POA: Diagnosis not present

## 2014-11-29 ENCOUNTER — Encounter: Payer: Self-pay | Admitting: Family Medicine

## 2014-11-29 ENCOUNTER — Ambulatory Visit (INDEPENDENT_AMBULATORY_CARE_PROVIDER_SITE_OTHER): Payer: Medicare Other | Admitting: Family Medicine

## 2014-11-29 VITALS — BP 136/87 | HR 70 | Temp 98.3°F | Ht 68.0 in | Wt 191.9 lb

## 2014-11-29 DIAGNOSIS — E7800 Pure hypercholesterolemia, unspecified: Secondary | ICD-10-CM

## 2014-11-29 DIAGNOSIS — R739 Hyperglycemia, unspecified: Secondary | ICD-10-CM | POA: Diagnosis not present

## 2014-11-29 DIAGNOSIS — M545 Low back pain: Secondary | ICD-10-CM | POA: Diagnosis not present

## 2014-11-29 MED ORDER — ESOMEPRAZOLE MAGNESIUM 40 MG PO CPDR
40.0000 mg | DELAYED_RELEASE_CAPSULE | ORAL | Status: DC
Start: 2014-11-29 — End: 2016-05-29

## 2014-11-29 MED ORDER — FLUTICASONE PROPIONATE 50 MCG/ACT NA SUSP: 2.0000 | Freq: Every day | NASAL | Status: DC

## 2014-11-29 MED ORDER — TRAMADOL HCL 50 MG PO TABS: ORAL_TABLET | ORAL | Status: DC

## 2014-11-29 NOTE — Patient Instructions (Signed)
I have called in the Flonase for your allergies and a new prescription for your Nexium. I am giving you a prescription for tramadol. I really would like to switch shoe from the Percocet that you take occasionally for your back pain to tramadol for back pain. I think is a much safer medicine. Please see me back in about a month and we'll see how this is going for you. Also think about the discussion we have regarding her cholesterol.

## 2014-11-30 NOTE — Assessment & Plan Note (Signed)
I reviewed her MRI which does show multilevel spondyloarthropathy with some foraminal narrowing and some spinal stenosis. I would like to switch her from Percocet to tramadol. We discussed at some length today. I gave her prescription for tramadol and urged her to try that the next time she is having back pain instead of the Percocet. Like to see her back in 3-4 weeks to follow that up hopefully we can switch her.

## 2014-11-30 NOTE — Progress Notes (Signed)
   Subjective:    Patient ID: Annette Rios, female    DOB: May 14, 1956, 59 y.o.   MRN: [id number]  HPI #1. Follow-up hypertension. Taken her medicine regularly. Does report that she has cough. This is intermittent. She'll have cough for 2 or 3 weeks almost daily and it'll go away for 2-3 months. She thinks it's related to postnasal drainage. She has not been on any type of allergy medicine recently but has in the past. #2. Here for follow-up of her lab results. She does not have diabetes mellitus. She does have high cholesterol. #3. Follow-up chronic low back pain. She is followed by Dr. Vertell Limber. He wants to do some more injections in her back but she doesn't really want to do that because they have not been very helpful to this point. She takes one or 2 Percocet at night many nights a month. Usually about half the time she has to take some pain medicine at night. Her pain is mostly in the low back and does not radiate to the legs. She has no leg weakness. She's not having bowel or bladder incontinence. Pain is constant throughout the day but worse in the evening when she's been up all day. It frequently keeps her from sleeping at night.   Review of Systems Denies chest pain, shortness of breath. See history of present illness above.    Objective:   Physical Exam  Vital signs reviewed. GENERAL: Well-developed, well-nourished, no acute distress. CARDIOVASCULAR: Regular rate and rhythm no murmur gallop or rub LUNGS: Clear to auscultation bilaterally, no rales or wheeze. ABDOMEN: Soft positive bowel sounds NEURO: No gross focal neurological deficits. MSK: Movement of extremity x 4.        Assessment & Plan:

## 2014-11-30 NOTE — Assessment & Plan Note (Signed)
We discussed her cholesterol level. I think she would benefit from medication. She has some questions about whether not she wants to do that. Ultimately will revisit this will see her back in the next month.

## 2014-12-04 ENCOUNTER — Telehealth: Payer: Self-pay | Admitting: Family Medicine

## 2014-12-04 MED ORDER — ATORVASTATIN CALCIUM 80 MG PO TABS: 80.0000 mg | ORAL_TABLET | Freq: Every day | ORAL | Status: DC

## 2014-12-04 NOTE — Telephone Encounter (Signed)
Dear [name] Team [facility], please tell her to start one pill a day I have called it in for her [facility]! Dorcas Mcmurray

## 2014-12-04 NOTE — Telephone Encounter (Signed)
Pt called and said that she thought about what Dr. Nori Riis said and has decided to start the cholesterol medication. Please send this in to her pharmacy. jw

## 2014-12-04 NOTE — Telephone Encounter (Signed)
Left Vm for pt to return call to inform her of below. Katharina Caper, Sumedha Munnerlyn D

## 2014-12-05 NOTE — Telephone Encounter (Signed)
Pt informed. Annette Rios  

## 2014-12-27 ENCOUNTER — Encounter: Payer: Self-pay | Admitting: Family Medicine

## 2014-12-27 ENCOUNTER — Ambulatory Visit (INDEPENDENT_AMBULATORY_CARE_PROVIDER_SITE_OTHER): Payer: Medicare Other | Admitting: Family Medicine

## 2014-12-27 VITALS — BP 143/83 | HR 69 | Temp 98.2°F | Ht 68.0 in | Wt 193.0 lb

## 2014-12-27 DIAGNOSIS — M545 Low back pain: Secondary | ICD-10-CM

## 2014-12-27 DIAGNOSIS — E7800 Pure hypercholesterolemia, unspecified: Secondary | ICD-10-CM

## 2014-12-27 DIAGNOSIS — I10 Essential (primary) hypertension: Secondary | ICD-10-CM | POA: Diagnosis not present

## 2014-12-27 DIAGNOSIS — R739 Hyperglycemia, unspecified: Secondary | ICD-10-CM | POA: Diagnosis present

## 2014-12-28 ENCOUNTER — Telehealth: Payer: Self-pay | Admitting: Family Medicine

## 2014-12-28 NOTE — Assessment & Plan Note (Signed)
No problems with cholesterol medicine. We discussed diet extensively. I'll see her back in about 2-3 months and we'll check her LDL at that time.

## 2014-12-28 NOTE — Assessment & Plan Note (Signed)
Little bit elevated today. She'll take some blood pressure readings and send those back to me. We'll continue current medications. Notably she did not take her blood pressure medicine until about an hour ago so this may be partly the reason she's not quite at goal today.

## 2014-12-28 NOTE — Progress Notes (Signed)
   Subjective:    Patient ID: Annette Rios, female    DOB: May 04, 1956, 59 y.o.   MRN: [id number]  HPI #1. Follow-up hypertension. Taking medications regularly without any problem. #2. His her head on the trunk lid of her car. No loss of consciousness. Is concerned she may have "shaken something loose inside" she had a little bit of a headache that night and the next day but that soon resolved. She's not had any blurred vision, no double vision, no unsteadiness.  #3. Follow-up cholesterol. She has decided to do the cholesterol medicine which I called in for her. She's not having any problems specifically no myalgias, no nausea. #4. Follow-up change in pain medication. She has to take 2 of the tramadol in the afternoon and 2 at night to reduce her pain. She would rather have the previously prescribed Vicodin where she only took one or 2 of those at night and not during the day.   Review of Systems See history of present illness    Objective:   Physical Exam  Vital signs reviewed. GENERAL: Well-developed, well-nourished, no acute distress. CARDIOVASCULAR: Regular rate and rhythm no murmur gallop or rub LUNGS: Clear to auscultation bilaterally, no rales or wheeze. ABDOMEN: Soft positive bowel sounds NEURO: No gross focal neurological deficits. MSK: Movement of extremity x 4. SCALP: Area where she was impacted by the trunk lid shows no bruising, no swelling, no ecchymoses, no skin laceration. There is no defect in the skull.       Assessment & Plan:

## 2014-12-28 NOTE — Assessment & Plan Note (Signed)
I would much rather have her on the tramadol than the Vicodin. We discussed at length. We'll continue with the tramadol.

## 2014-12-28 NOTE — Telephone Encounter (Signed)
Pt called and would like a refill on her pain medication. jw °

## 2015-01-01 NOTE — Telephone Encounter (Signed)
Pt is calling to check the status of her refill request on Tramadol. Please call when left up front for pick up. jw

## 2015-01-02 MED ORDER — TRAMADOL HCL 50 MG PO TABS: ORAL_TABLET | ORAL | Status: DC

## 2015-01-02 NOTE — Telephone Encounter (Signed)
Dear [name] Team Can we call in tramadol? If notlet me know If yes,please call in #120 w 5 refills as per signed refill sig 1-2 bid prn And either let me know so I can write one or let her know we called it in [facility]! Dorcas Mcmurray

## 2015-01-03 NOTE — Telephone Encounter (Signed)
LM for pharmacy with refill information. Marilyn Wing,CMA

## 2015-02-06 DIAGNOSIS — H5203 Hypermetropia, bilateral: Secondary | ICD-10-CM | POA: Diagnosis not present

## 2015-02-06 DIAGNOSIS — H524 Presbyopia: Secondary | ICD-10-CM | POA: Diagnosis not present

## 2015-02-06 DIAGNOSIS — H2513 Age-related nuclear cataract, bilateral: Secondary | ICD-10-CM | POA: Diagnosis not present

## 2015-02-06 DIAGNOSIS — H52201 Unspecified astigmatism, right eye: Secondary | ICD-10-CM | POA: Diagnosis not present

## 2015-02-19 ENCOUNTER — Other Ambulatory Visit: Payer: Self-pay | Admitting: Family Medicine

## 2015-02-19 NOTE — Telephone Encounter (Signed)
Needs refill on tramadol [facility] on [address]

## 2015-02-22 MED ORDER — TRAMADOL HCL 50 MG PO TABS: ORAL_TABLET | ORAL | Status: DC

## 2015-02-22 NOTE — Telephone Encounter (Signed)
Dear [name] Team Please call in w 5  Refills as below [place]! Dorcas Mcmurray

## 2015-02-23 NOTE — Telephone Encounter (Signed)
Rx phoned in per Dr. Nori Riis, pt aware. Katharina Caper, April D, [name]

## 2015-02-23 NOTE — Telephone Encounter (Signed)
Patient would like a call once done, needs to know if this is being called in or does she have to pick it up.

## 2015-03-27 ENCOUNTER — Other Ambulatory Visit: Payer: Self-pay | Admitting: Family Medicine

## 2015-03-27 NOTE — Telephone Encounter (Signed)
Pt called and needs a refill on her Tramadol left up front. jw °

## 2015-03-28 NOTE — Telephone Encounter (Signed)
Dear [name] Team According to chart I sent refills on the rx request --there should have been 5 refills. Can u please call thepharmacy and check---if no refills I will authorize 5 refills (I may have to print that) She may be unaware refills exist THANKS! Dorcas Mcmurray

## 2015-03-28 NOTE — Telephone Encounter (Signed)
Spoke with Rob at pharmacy.  5 refills were called in, he will fill one now.  Pt informed. Fleeger, [name]

## 2015-05-16 DIAGNOSIS — M5416 Radiculopathy, lumbar region: Secondary | ICD-10-CM | POA: Diagnosis not present

## 2015-05-16 DIAGNOSIS — M545 Low back pain: Secondary | ICD-10-CM | POA: Diagnosis not present

## 2015-05-16 DIAGNOSIS — G5602 Carpal tunnel syndrome, left upper limb: Secondary | ICD-10-CM | POA: Diagnosis not present

## 2015-05-16 DIAGNOSIS — M4806 Spinal stenosis, lumbar region: Secondary | ICD-10-CM | POA: Diagnosis not present

## 2015-06-01 DIAGNOSIS — M5416 Radiculopathy, lumbar region: Secondary | ICD-10-CM | POA: Diagnosis not present

## 2015-06-06 ENCOUNTER — Encounter: Payer: Self-pay | Admitting: Family Medicine

## 2015-06-06 ENCOUNTER — Ambulatory Visit (INDEPENDENT_AMBULATORY_CARE_PROVIDER_SITE_OTHER): Payer: Medicare Other | Admitting: Family Medicine

## 2015-06-06 VITALS — BP 113/55 | HR 67 | Temp 98.3°F | Ht 68.0 in | Wt 184.0 lb

## 2015-06-06 DIAGNOSIS — E7800 Pure hypercholesterolemia, unspecified: Secondary | ICD-10-CM

## 2015-06-06 DIAGNOSIS — I10 Essential (primary) hypertension: Secondary | ICD-10-CM | POA: Diagnosis not present

## 2015-06-06 DIAGNOSIS — M545 Low back pain: Secondary | ICD-10-CM

## 2015-06-06 DIAGNOSIS — R739 Hyperglycemia, unspecified: Secondary | ICD-10-CM

## 2015-06-06 LAB — COMPREHENSIVE METABOLIC PANEL
ALBUMIN: 4.2 g/dL (ref 3.6–5.1)
ALK PHOS: 35 U/L (ref 33–130)
ALT: 26 U/L (ref 6–29)
AST: 11 U/L (ref 10–35)
BILIRUBIN TOTAL: 0.9 mg/dL (ref 0.2–1.2)
BUN: 17 mg/dL (ref 7–25)
CALCIUM: 10 mg/dL (ref 8.6–10.4)
CO2: 25 mmol/L (ref 20–31)
Chloride: 105 mmol/L (ref 98–110)
Creat: 0.7 mg/dL (ref 0.50–0.99)
Glucose, Bld: 94 mg/dL (ref 65–99)
POTASSIUM: 3.9 mmol/L (ref 3.5–5.3)
Sodium: 140 mmol/L (ref 135–146)
TOTAL PROTEIN: 6.9 g/dL (ref 6.1–8.1)

## 2015-06-06 LAB — LDL CHOLESTEROL, DIRECT: LDL DIRECT: 62 mg/dL (ref <130)

## 2015-06-06 MED ORDER — BENAZEPRIL-HYDROCHLOROTHIAZIDE 20-12.5 MG PO TABS: 1.0000 | ORAL_TABLET | Freq: Every day | ORAL | Status: DC

## 2015-06-06 NOTE — Assessment & Plan Note (Signed)
We'll check direct LDL today. She's now been on cholesterol medicine for several months. She's had no problems with it. Follow-up 6 months.

## 2015-06-06 NOTE — Assessment & Plan Note (Signed)
Recheck A1c today. We talked about diet. She's actually been trying to improve her diet.

## 2015-06-06 NOTE — Progress Notes (Signed)
   Subjective:    Patient ID: Annette Rios, female    DOB: 1956/08/25, 59 y.o.   MRN: [id number]  HPI 1. F/u Cholesterolemia--no problems w meds. 2. Follow-up hypertension. Needs refills on her blood pressure medicine. She is taking regularly without problem. #3. Hyperglycemia. We'll recheck her A1c today.  Review of Systems No chest pain, no shortness of breath, no palpitations, no lower extremity edema. Denies headache, no dizziness. No cough.    Objective:   Physical Exam Vital signs reviewed. GENERAL: Well-developed, well-nourished, no acute distress. CARDIOVASCULAR: Regular rate and rhythm no murmur gallop or rub LUNGS: Clear to auscultation bilaterally, no rales or wheeze. ABDOMEN: Soft positive bowel sounds NEURO: No gross focal neurological deficits. MSK: Movement of extremity x 4.         Assessment & Plan:

## 2015-06-06 NOTE — Assessment & Plan Note (Signed)
Refilled blood pressure medicines. No problems.

## 2015-06-06 NOTE — Patient Instructions (Signed)
I will send you a note about your lab work You should already have refills on the cholesterol medicine and you have 2 more refills on the tramadol. I called in your BP refills. Great to see you!

## 2015-06-16 ENCOUNTER — Encounter: Payer: Self-pay | Admitting: Family Medicine

## 2015-07-31 DIAGNOSIS — G5602 Carpal tunnel syndrome, left upper limb: Secondary | ICD-10-CM | POA: Diagnosis not present

## 2015-07-31 DIAGNOSIS — G520 Disorders of olfactory nerve: Secondary | ICD-10-CM | POA: Diagnosis not present

## 2015-08-28 ENCOUNTER — Other Ambulatory Visit: Payer: Self-pay | Admitting: Family Medicine

## 2015-08-29 NOTE — Telephone Encounter (Signed)
Rx phoned in. Zimmerman Rumple, April D, CMA  

## 2015-08-29 NOTE — Telephone Encounter (Signed)
Dear [name] Team Please call this in (tramadol) THANKS! Annette Rios

## 2015-10-10 ENCOUNTER — Ambulatory Visit (INDEPENDENT_AMBULATORY_CARE_PROVIDER_SITE_OTHER): Payer: Medicare Other | Admitting: Family Medicine

## 2015-10-10 ENCOUNTER — Encounter: Payer: Self-pay | Admitting: Family Medicine

## 2015-10-10 VITALS — BP 122/70 | HR 70 | Temp 98.2°F | Ht 68.0 in | Wt 193.2 lb

## 2015-10-10 DIAGNOSIS — R05 Cough: Secondary | ICD-10-CM

## 2015-10-10 DIAGNOSIS — R739 Hyperglycemia, unspecified: Secondary | ICD-10-CM | POA: Diagnosis present

## 2015-10-10 DIAGNOSIS — R059 Cough, unspecified: Secondary | ICD-10-CM

## 2015-10-10 MED ORDER — GUAIFENESIN ER 600 MG PO TB12: 600.0000 mg | ORAL_TABLET | Freq: Two times a day (BID) | ORAL | Status: DC

## 2015-10-10 NOTE — Assessment & Plan Note (Addendum)
Patient presenting with an isolated dry cough that is starting to improve. Most likely viral acute bronchitis. Less likely GERD related as this seems stable and she's on Nexium. Could be ACE related however patient notes she has this cough annually and notes cough is improving- would expect an ACE inhibitor related cough to linger until discontinuation of the Lotensin. Patient taking Coricidin, however not having many of the symptoms that this medication is treating. Will narrow therapy. She continues to have a good appetite.  - Mucinex 600mg  BID for cough - RTC precautions: fevers, worsening cough, other new symptoms, cough not continuing to improve with Mucinex. - if cough failing to resolve, could consider trial off ACE inhibitor.

## 2015-10-10 NOTE — Progress Notes (Signed)
Patient ID: Annette Rios, female   DOB: [birth year], 60 y.o.   MRN: [id number]    Subjective: CC: dry cough HPI: Patient is a 60 y.o. female with a past medical history of HTN and GERD presenting to clinic today for a dry cough.  COUGH Has been coughing over 2 weeks now Cough is: dry Patient notes that cough is most prominent during the nighttime (she coughs 4-5 times at night and then can fall asleep the rest of the night). Now starting to bring up some white phlegm. Over all patient feels cough is improving since it started 2 weeks ago. Medications tried: Coricidin HBP with some improvement Taking blood pressure medications: has been on Lotensin for several years She's had this cough off and on over the last several years, notes it is normally around this time of year and improves with OTC medications.    Symptoms Runny nose: No  Mucous in back of throat: No Throat burning or reflux: No  Wheezing or asthma: No  Fever: No  Chest Pain: No Abdominal pain: now due to intense coughing  Shortness of breath: no  Leg swelling: no  Hemoptysis: no  No foul taste in mouth or feelings of acid reflux.  Continues to have a good appetite.  Social History: never smoker  Health Maintenance: declined the flu vaccine  ROS: All other systems reviewed and are negative.  Past Medical History Patient Active Problem List   Diagnosis Date Noted  . Cough 10/10/2015  . Hyperglycemia 11/01/2014  . GERD (gastroesophageal reflux disease) 11/01/2014  . History of colonic polyps 11/01/2014  . HYPERCHOLESTEROLEMIA 12/03/2006  . DEPRESSION, MAJOR, RECURRENT 12/03/2006  . HYPERTENSION, BENIGN SYSTEMIC 12/03/2006  . MENOPAUSAL SYNDROME 12/03/2006  . BACK PAIN, LOW 12/03/2006    Medications- reviewed and updated Current Outpatient Prescriptions  Medication Sig Dispense Refill  . aspirin 81 MG tablet Take 81 mg by mouth daily.    Marland Kitchen atorvastatin (LIPITOR) 80 MG tablet Take 1 tablet (80 mg total)  by mouth daily. 90 tablet 3  . benazepril-hydrochlorthiazide (LOTENSIN HCT) 20-12.5 MG per tablet Take 1 tablet by mouth daily. 90 tablet 3  . calcium carbonate (TUMS - DOSED IN MG ELEMENTAL CALCIUM) 500 MG chewable tablet Chew 1 tablet by mouth daily.    . clobetasol cream (TEMOVATE) AB-123456789 % Apply 1 application topically 2 (two) times daily. 15 g 0  . esomeprazole (NEXIUM) 40 MG capsule Take 1 capsule (40 mg total) by mouth 1 day or 1 dose. 90 capsule 3  . fluticasone (FLONASE) 50 MCG/ACT nasal spray Place 2 sprays into both nostrils daily. 16 g 12  . guaiFENesin (MUCINEX) 600 MG 12 hr tablet Take 1 tablet (600 mg total) by mouth 2 (two) times daily. 20 tablet 0  . mupirocin cream (BACTROBAN) 2 % Apply 1 application topically 2 (two) times daily. 15 g 0  . traMADol (ULTRAM) 50 MG tablet TAKE 1 OR 2 TABLETS BY MOUTH ONE TO TWO TIMES A DAY AS NEEDED FOR BACK PAIN(TAKE IN PLACE OF PERCOCET) 120 tablet 5   No current facility-administered medications for this visit.    Objective: Office vital signs reviewed. BP 122/70 mmHg  Pulse 70  Temp(Src) 98.2 F (36.8 C) (Oral)  Ht 5\' 8"  (1.727 m)  Wt 193 lb 3.2 oz (87.635 kg)  BMI 29.38 kg/m2   Physical Examination:  General: Awake, alert, well- nourished, NAD. Very pleasant and talkative. ENMT:  TMs intact, normal light reflex, no erythema, no bulging. Nasal turbinates  moist. MMM, Oropharynx clear without erythema or tonsillar exudate/hypertrophy Eyes: Conjunctiva non-injected. PERRL.  Cardio: RRR, no m/r/g noted. No pitting edema. 2+ radial pulses.  Pulm: No increased WOB.  CTAB, without wheezes, rhonchi or crackles noted.   Assessment/Plan: Cough Patient presenting with an isolated dry cough that is starting to improve. Most likely viral acute bronchitis. Less likely GERD related as this seems stable and she's on Nexium. Could be ACE related however patient notes she has this cough annually and notes cough is improving- would expect an ACE  inhibitor related cough to linger until discontinuation of the Lotensin. Patient taking Coricidin, however not having many of the symptoms that this medication is treating. Will narrow therapy. She continues to have a good appetite.  - Mucinex 600mg  BID for cough - RTC precautions: fevers, worsening cough, other new symptoms, cough not continuing to improve with Mucinex. - if cough failing to resolve, could consider trial off ACE inhibitor.    No orders of the defined types were placed in this encounter.    Meds ordered this encounter  Medications  . guaiFENesin (MUCINEX) 600 MG 12 hr tablet    Sig: Take 1 tablet (600 mg total) by mouth 2 (two) times daily.    Dispense:  20 tablet    Refill:  [facility] PGY-2, [place]

## 2015-10-10 NOTE — Patient Instructions (Addendum)
I have prescribed you Guaifenesin that you can take twice a day.  You can stop taking the Coricidin If your cough doesn't resolve within the next week, please come back and see [place], we may need to change your blood pressure medication. If you develop new symptoms such as shortness of breath, fever, chills, poor appetite, follow up with [place] as well.

## 2015-10-24 ENCOUNTER — Ambulatory Visit: Payer: Medicare Other | Admitting: Family Medicine

## 2015-10-26 ENCOUNTER — Ambulatory Visit (INDEPENDENT_AMBULATORY_CARE_PROVIDER_SITE_OTHER): Payer: Medicare Other | Admitting: Family Medicine

## 2015-10-26 VITALS — BP 131/86 | HR 76 | Temp 98.5°F | Resp 20 | Ht 68.0 in | Wt 192.9 lb

## 2015-10-26 DIAGNOSIS — R739 Hyperglycemia, unspecified: Secondary | ICD-10-CM | POA: Diagnosis not present

## 2015-10-26 DIAGNOSIS — R059 Cough, unspecified: Secondary | ICD-10-CM

## 2015-10-26 DIAGNOSIS — Z23 Encounter for immunization: Secondary | ICD-10-CM | POA: Diagnosis not present

## 2015-10-26 DIAGNOSIS — R0600 Dyspnea, unspecified: Secondary | ICD-10-CM | POA: Insufficient documentation

## 2015-10-26 DIAGNOSIS — Z0000 Encounter for general adult medical examination without abnormal findings: Secondary | ICD-10-CM

## 2015-10-26 DIAGNOSIS — R05 Cough: Secondary | ICD-10-CM | POA: Diagnosis not present

## 2015-10-26 NOTE — Progress Notes (Signed)
Patient following up for cough  Patient states cough is still present. She coughs 6-7 times and stops.  Patient tolerated Tdap vaccine well.

## 2015-10-26 NOTE — Progress Notes (Signed)
Patient ID: Annette Rios, female   DOB: 1955-11-09, 60 y.o.   MRN: [id number]    Subjective: CC: f/u cough HPI: Patient is a 60 y.o. female with a past medical history of HTN and cough presenting to clinic today for day for a same day appt for f/u on cough.  Patient presenting with 4 weeks of cough. Cough is improving since she was seen on 1/4. She notes before she goes to bed she coughs 6-7 times (after lying down). She coughs approximately 6-7 times when she wakes up in the morning and then she's fine for the rest of the day. She feels there is stuff in her throat in the morning, but otherwise notes a non-productive cough. She denies watery eyes, ear pain, rhinorrhea, fever, chest pain, or SOB.   She's been using the flonase.  Used Mucinex x 8 days which she felt really helped. She uses 4 pillows at bedtime- she can't breath lying down supine. This has been doing this for several years. She snores. She sometimes wakes up gasping for air. She denies LE swelling. Intermittently feels well rested upon wakening.  Never had a sleep study.  Social History: never smoker.  Health Maintenance: decline flu vaccine. TDaP done today.  ROS: All other systems reviewed and are negative except that in HPI.  Past Medical History Patient Active Problem List   Diagnosis Date Noted  . PND (paroxysmal nocturnal dyspnea) 10/26/2015  . Cough 10/10/2015  . Hyperglycemia 11/01/2014  . GERD (gastroesophageal reflux disease) 11/01/2014  . History of colonic polyps 11/01/2014  . HYPERCHOLESTEROLEMIA 12/03/2006  . DEPRESSION, MAJOR, RECURRENT 12/03/2006  . HYPERTENSION, BENIGN SYSTEMIC 12/03/2006  . MENOPAUSAL SYNDROME 12/03/2006  . BACK PAIN, LOW 12/03/2006    Medications- reviewed and updated Current Outpatient Prescriptions  Medication Sig Dispense Refill  . aspirin 81 MG tablet Take 81 mg by mouth daily.    Marland Kitchen atorvastatin (LIPITOR) 80 MG tablet Take 1 tablet (80 mg total) by mouth daily. 90 tablet 3   . benazepril-hydrochlorthiazide (LOTENSIN HCT) 20-12.5 MG per tablet Take 1 tablet by mouth daily. 90 tablet 3  . calcium carbonate (TUMS - DOSED IN MG ELEMENTAL CALCIUM) 500 MG chewable tablet Chew 1 tablet by mouth daily.    . clobetasol cream (TEMOVATE) [id number] % Apply 1 application topically 2 (two) times daily. 15 g 0  . esomeprazole (NEXIUM) 40 MG capsule Take 1 capsule (40 mg total) by mouth 1 day or 1 dose. 90 capsule 3  . fluticasone (FLONASE) 50 MCG/ACT nasal spray Place 2 sprays into both nostrils daily. 16 g 12  . guaiFENesin (MUCINEX) 600 MG 12 hr tablet Take 1 tablet (600 mg total) by mouth 2 (two) times daily. 20 tablet 0  . mupirocin cream (BACTROBAN) 2 % Apply 1 application topically 2 (two) times daily. 15 g 0  . traMADol (ULTRAM) 50 MG tablet TAKE 1 OR 2 TABLETS BY MOUTH ONE TO TWO TIMES A DAY AS NEEDED FOR BACK PAIN(TAKE IN PLACE OF PERCOCET) 120 tablet 5   No current facility-administered medications for this visit.    Objective: Office vital signs reviewed. BP 131/86 mmHg  Pulse 76  Temp(Src) 98.5 F (36.9 C) (Oral)  Resp 20  Ht 5\' 8"  (1.727 m)  Wt 192 lb 14.4 oz (87.499 kg)  BMI 29.34 kg/m2  SpO2 99%   Physical Examination:  General: Awake, alert, well- nourished, NAD ENMT:  TMs intact, normal light reflex, no erythema, no bulging. Nasal turbinates moist. MMM, Oropharynx clear  without erythema or tonsillar exudate. Grade 2 tonsillar hypertrophy.  Eyes: Conjunctiva non-injected. PERRL.  Cardio: RRR, no m/r/g noted. No JVD. No pitting edema noted.  Pulm: No increased WOB.  CTAB, without wheezes, rhonchi or crackles noted.  Skin: dry, intact, no rashes or lesions   Assessment/Plan: Cough Cough continues to improve. Most likely ciliary dyskinesia sequela from recent infection. No evidence of volume overload on exam.  Lungs are clear. Since improving, I doubt this is from antihypertensive.  - Continue to monitor  - no indication for CXR this AM.  - RTC  precautions discussed.   PND (paroxysmal nocturnal dyspnea) Patient with PND, orthopnea, and snoring. Never had a sleep study. Given symptoms, there is some concern for apnea.  - sleep study ordered.      Orders Placed This Encounter  Procedures  . Tdap vaccine greater than or equal to 7yo IM  . Nocturnal polysomnography (NPSG)    Sleeps on 4 pillows, loud snoring, PND.    Standing Status: Future     Number of Occurrences:      Standing Expiration Date: 10/25/2016    Order Specific Question:  Where should this test be performed:    Answer:  [facility]    No orders of the defined types were placed in this encounter.    Archie Patten PGY-2, [place]

## 2015-10-26 NOTE — Patient Instructions (Signed)
I have referred you for a sleep study.  Please return if your cough is not improving.

## 2015-10-26 NOTE — Assessment & Plan Note (Signed)
Patient with PND, orthopnea, and snoring. Never had a sleep study. Given symptoms, there is some concern for apnea.  - sleep study ordered.

## 2015-10-26 NOTE — Assessment & Plan Note (Addendum)
Cough continues to improve. Most likely ciliary dyskinesia sequela from recent infection. No evidence of volume overload on exam.  Lungs are clear. Since improving, I doubt this is from antihypertensive.  - Continue to monitor  - no indication for CXR this AM.  - RTC precautions discussed.

## 2015-11-09 ENCOUNTER — Other Ambulatory Visit: Payer: Self-pay | Admitting: Family Medicine

## 2015-11-16 ENCOUNTER — Ambulatory Visit (HOSPITAL_BASED_OUTPATIENT_CLINIC_OR_DEPARTMENT_OTHER): Payer: Medicare Other | Attending: Family Medicine | Admitting: *Deleted

## 2015-11-16 DIAGNOSIS — R0600 Dyspnea, unspecified: Secondary | ICD-10-CM | POA: Insufficient documentation

## 2015-11-16 DIAGNOSIS — R0683 Snoring: Secondary | ICD-10-CM | POA: Diagnosis not present

## 2015-11-16 DIAGNOSIS — G4761 Periodic limb movement disorder: Secondary | ICD-10-CM | POA: Insufficient documentation

## 2015-11-18 ENCOUNTER — Other Ambulatory Visit: Payer: Self-pay | Admitting: Family Medicine

## 2015-11-18 DIAGNOSIS — G4761 Periodic limb movement disorder: Secondary | ICD-10-CM | POA: Diagnosis not present

## 2015-11-18 NOTE — Progress Notes (Addendum)
  Patient Name: Annette Rios, Annette Rios Date: 11/16/2015 Gender: Female D.O.B: Jul 17, 1956 Age (years): 59 Referring Provider: Talbert Cage Height (inches): 68 Interpreting Physician: Baird Lyons MD, ABSM Weight (lbs): 180 RPSGT: Gerhard Perches BMI: 27 MRN: [id number] Neck Size: 14.00 CLINICAL INFORMATION Sleep Study Type: NPSG   Indication for sleep study: Snoring   Epworth Sleepiness Score: 4/24   SLEEP STUDY TECHNIQUE As per the AASM Manual for the Scoring of Sleep and Associated Events v2.3 (April 2016) with a hypopnea requiring 4% desaturations. The channels recorded and monitored were frontal, central and occipital EEG, electrooculogram (EOG), submentalis EMG (chin), nasal and oral airflow, thoracic and abdominal wall motion, anterior tibialis EMG, snore microphone, electrocardiogram, and pulse oximetry.  MEDICATIONS Patient's medications include: charted for review Medications self-administered by patient during sleep study : No sleep medicine administered.  SLEEP ARCHITECTURE The study was initiated at 10:00:27 PM and ended at 4:29:29 AM. Sleep onset time was 23.2 minutes and the sleep efficiency was 76.8%. The total sleep time was 298.9 minutes. Stage REM latency was 91.5 minutes. The patient spent 2.34% of the night in stage N1 sleep, 73.40% in stage N2 sleep, 16.23% in stage N3 and 8.03% in REM. Alpha intrusion was absent. Supine sleep was 28.27%. Wake after sleep onset 67 minutes  RESPIRATORY PARAMETERS The overall apnea/hypopnea index (AHI) was 3.4 per hour. There were 2 total apneas, including 1 obstructive, 1 central and 0 mixed apneas. There were 15 hypopneas and 0 RERAs. The AHI during Stage REM sleep was 22.5 per hour. AHI while supine was 7.1 per hour. The mean oxygen saturation was 96.41%. The minimum SpO2 during sleep was 84.00%. Soft snoring was noted during this study.  CARDIAC DATA The 2 lead EKG demonstrated sinus rhythm. The mean heart  rate was 62.40 beats per minute. Other EKG findings include: None.  LEG MOVEMENT DATA The total PLMS were 530 with a resulting PLMS index of 106.40. Associated arousal with leg movement index was 0.8 .  IMPRESSIONS - No significant obstructive sleep apnea occurred during this study (AHI = 3.4/h). - No significant central sleep apnea occurred during this study (CAI = 0.2/h). - Mild oxygen desaturation was noted during this study (Min O2 = 84.00%). - The patient snored with Soft snoring volume. - No cardiac abnormalities were noted during this study. - Severe periodic limb movements of sleep occurred during the study. No significant associated arousals.  DIAGNOSIS - Periodic Limb Movement Syndrome (327.51 [G47.61 ICD-10])  RECOMMENDATIONS - Although limb movements could not be shown to cause arousal, there were so many that a therapeutic trial such as Requip or clonazepam might be considered to see if sleep quality improves. - Avoid alcohol, sedatives and other CNS depressants that may worsen sleep apnea and disrupt normal sleep architecture. - Sleep hygiene should be reviewed to assess factors that may improve sleep quality. - Weight management and regular exercise should be initiated or continued if appropriate.  Deneise Lever Diplomate, American Board of Sleep Medicine  ELECTRONICALLY SIGNED ON:  11/18/2015, 12:03 PM [place] PH: [phone]   FX: [phone] [place]

## 2015-11-21 DIAGNOSIS — I10 Essential (primary) hypertension: Secondary | ICD-10-CM | POA: Diagnosis not present

## 2015-11-21 DIAGNOSIS — Z6829 Body mass index (BMI) 29.0-29.9, adult: Secondary | ICD-10-CM | POA: Diagnosis not present

## 2015-11-21 DIAGNOSIS — G5602 Carpal tunnel syndrome, left upper limb: Secondary | ICD-10-CM | POA: Diagnosis not present

## 2015-12-26 ENCOUNTER — Encounter (HOSPITAL_BASED_OUTPATIENT_CLINIC_OR_DEPARTMENT_OTHER): Payer: Medicare Other

## 2016-01-21 ENCOUNTER — Ambulatory Visit
Admission: RE | Admit: 2016-01-21 | Discharge: 2016-01-21 | Disposition: A | Discharge status: Home / Self Care | Payer: Medicare Other | Source: Ambulatory Visit

## 2016-01-21 ENCOUNTER — Other Ambulatory Visit: Payer: Self-pay

## 2016-01-21 DIAGNOSIS — Z1231 Encounter for screening mammogram for malignant neoplasm of breast: Secondary | ICD-10-CM

## 2016-01-22 ENCOUNTER — Ambulatory Visit: Payer: Medicare Other

## 2016-01-30 ENCOUNTER — Ambulatory Visit (INDEPENDENT_AMBULATORY_CARE_PROVIDER_SITE_OTHER): Payer: Medicare Other | Admitting: Family Medicine

## 2016-01-30 ENCOUNTER — Encounter: Payer: Self-pay | Admitting: Family Medicine

## 2016-01-30 VITALS — BP 144/58 | HR 73 | Temp 98.2°F | Ht 68.0 in | Wt 192.4 lb

## 2016-01-30 DIAGNOSIS — I10 Essential (primary) hypertension: Secondary | ICD-10-CM | POA: Diagnosis not present

## 2016-01-30 DIAGNOSIS — E7800 Pure hypercholesterolemia, unspecified: Secondary | ICD-10-CM | POA: Diagnosis not present

## 2016-01-30 DIAGNOSIS — Q828 Other specified congenital malformations of skin: Secondary | ICD-10-CM

## 2016-01-30 LAB — BASIC METABOLIC PANEL WITH GFR
BUN: 10 mg/dL (ref 7–25)
CO2: 30 mmol/L (ref 20–31)
CREATININE: 0.76 mg/dL (ref 0.50–0.99)
Calcium: 9.5 mg/dL (ref 8.6–10.4)
Chloride: 102 mmol/L (ref 98–110)
GFR, Est Non African American: 86 mL/min (ref >=60)
Glucose, Bld: 137 mg/dL — ABNORMAL HIGH (ref 65–99)
Potassium: 3.8 mmol/L (ref 3.5–5.3)
Sodium: 140 mmol/L (ref 135–146)

## 2016-01-30 MED ORDER — DIAZEPAM 5 MG PO TABS: ORAL_TABLET | ORAL | Status: DC

## 2016-01-30 MED ORDER — TRAMADOL HCL 50 MG PO TABS: ORAL_TABLET | ORAL | Status: DC

## 2016-02-01 NOTE — Assessment & Plan Note (Signed)
Stable. We do not need to check her

## 2016-02-01 NOTE — Assessment & Plan Note (Signed)
Stable well-controlled. We'll check her kidney function today. Follow-up 6 months.

## 2016-02-01 NOTE — Progress Notes (Addendum)
   Subjective:    Patient ID: Annette Rios, female    DOB: Jul 10, 1956, 60 y.o.   MRN: [id number]  HPI Follow-up hypertension. Doing well with no problems. She's taking her medicine regularly. #2. She is going to be traveling by plane. Has some slight anxiety. Has previously used some medicine to help her with this. Would like to try that for her next flight. She's going to the [place] #3. Has multiple skin tags she wants a look at. Several in the bra line and several on the neck area or irritating.   Review of Systems No fever, sweats, chills, unusual weight change. Denies chest pain. No shortness of breath.    Objective:   Physical Exam Vital signs reviewed. GENERAL: Well-developed, well-nourished, no acute distress. CARDIOVASCULAR: Regular rate and rhythm no murmur gallop or rub LUNGS: Clear to auscultation bilaterally, no rales or wheeze. ABDOMEN: Soft positive bowel sounds NEURO: No gross focal neurological deficits. MSK: Movement of extremity x 4. SKIN: Multiple normal appearing skin tags in the neck and bra line area.        Assessment & Plan:  Skin tags: There were 6 in the lower bra line that we removed today. Informed consent for this minor procedure was given, signed copy in the chart. Appropriate time out taken. These areas were cleaned with alcohol, the base of the skin tag  was crushed with forceps, then skin tag was snipped off with iris scissors. Any small amount of bleeding was stopped using pressure and/or silver nitrate. She tolerated the procedure well.

## 2016-02-06 ENCOUNTER — Encounter: Payer: Self-pay | Admitting: Family Medicine

## 2016-02-11 ENCOUNTER — Other Ambulatory Visit: Payer: Self-pay | Admitting: Family Medicine

## 2016-02-11 ENCOUNTER — Telehealth: Payer: Self-pay | Admitting: *Deleted

## 2016-02-11 NOTE — Telephone Encounter (Signed)
Pt is going to [place] and she needs a copy of ALL her meds and pcp number if they give her a problem at the airport. Deseree Kennon Holter, CMA

## 2016-02-12 NOTE — Telephone Encounter (Signed)
I am able to give her a list of her meds but will forward to PCP to see which number she wants to give for this. If it is the main number i can also give this to her as well. Annette Rios, [name], [name]

## 2016-02-14 NOTE — Telephone Encounter (Signed)
I cannot imagine hem giving her a problem for any of her prescribed meds. Thanks fr taking care of this.s best number. THANKS! Dorcas Mcmurray

## 2016-02-14 NOTE — Telephone Encounter (Signed)
Med list placed up front, patient aware.

## 2016-05-13 ENCOUNTER — Other Ambulatory Visit: Payer: Self-pay | Admitting: Family Medicine

## 2016-05-25 ENCOUNTER — Emergency Department (HOSPITAL_COMMUNITY)
Admission: EM | Admit: 2016-05-25 | Discharge: 2016-05-25 | Disposition: A | Discharge status: Home / Self Care | Payer: Medicare Other | Attending: Emergency Medicine | Admitting: Emergency Medicine

## 2016-05-25 ENCOUNTER — Encounter (HOSPITAL_COMMUNITY): Payer: Self-pay

## 2016-05-25 DIAGNOSIS — Z7982 Long term (current) use of aspirin: Secondary | ICD-10-CM | POA: Insufficient documentation

## 2016-05-25 DIAGNOSIS — Y999 Unspecified external cause status: Secondary | ICD-10-CM | POA: Diagnosis not present

## 2016-05-25 DIAGNOSIS — L03113 Cellulitis of right upper limb: Secondary | ICD-10-CM

## 2016-05-25 DIAGNOSIS — Y929 Unspecified place or not applicable: Secondary | ICD-10-CM | POA: Diagnosis not present

## 2016-05-25 DIAGNOSIS — I10 Essential (primary) hypertension: Secondary | ICD-10-CM | POA: Insufficient documentation

## 2016-05-25 DIAGNOSIS — W503XXA Accidental bite by another person, initial encounter: Secondary | ICD-10-CM | POA: Insufficient documentation

## 2016-05-25 DIAGNOSIS — M7989 Other specified soft tissue disorders: Secondary | ICD-10-CM | POA: Diagnosis present

## 2016-05-25 DIAGNOSIS — Y939 Activity, unspecified: Secondary | ICD-10-CM | POA: Diagnosis not present

## 2016-05-25 HISTORY — DX: Pure hypercholesterolemia, unspecified: E78.00

## 2016-05-25 LAB — CBC WITH DIFFERENTIAL/PLATELET
Basophils Absolute: 0 10*3/uL (ref 0.0–0.1)
Basophils Relative: 0 %
Eosinophils Absolute: 0 10*3/uL (ref 0.0–0.7)
Eosinophils Relative: 0 %
HCT: 39.7 % (ref 36.0–46.0)
Hemoglobin: 12.9 g/dL (ref 12.0–15.0)
Lymphocytes Relative: 45 %
Lymphs Abs: 3 10*3/uL (ref 0.7–4.0)
MCH: 31.6 pg (ref 26.0–34.0)
MCHC: 32.5 g/dL (ref 30.0–36.0)
MCV: 97.3 fL (ref 78.0–100.0)
Monocytes Absolute: 0.3 10*3/uL (ref 0.1–1.0)
Monocytes Relative: 4 %
Neutro Abs: 3.3 10*3/uL (ref 1.7–7.7)
Neutrophils Relative %: 51 %
Platelets: 263 10*3/uL (ref 150–400)
RBC: 4.08 MIL/uL (ref 3.87–5.11)
RDW: 14 % (ref 11.5–15.5)
WBC: 6.5 10*3/uL (ref 4.0–10.5)

## 2016-05-25 LAB — COMPREHENSIVE METABOLIC PANEL
ALT: 30 U/L (ref 14–54)
AST: 24 U/L (ref 15–41)
Albumin: 4.2 g/dL (ref 3.5–5.0)
Alkaline Phosphatase: 38 U/L (ref 38–126)
Anion gap: 5 (ref 5–15)
BUN: 9 mg/dL (ref 6–20)
CO2: 28 mmol/L (ref 22–32)
Calcium: 9.9 mg/dL (ref 8.9–10.3)
Chloride: 104 mmol/L (ref 101–111)
Creatinine, Ser: 0.64 mg/dL (ref 0.44–1.00)
GFR calc Af Amer: >60 mL/min (ref >60)
GFR calc non Af Amer: >60 mL/min (ref >60)
Glucose, Bld: 120 mg/dL — ABNORMAL HIGH (ref 65–99)
Potassium: 3.6 mmol/L (ref 3.5–5.1)
Sodium: 137 mmol/L (ref 135–145)
Total Bilirubin: 1.2 mg/dL (ref 0.3–1.2)
Total Protein: 7.3 g/dL (ref 6.5–8.1)

## 2016-05-25 LAB — I-STAT CG4 LACTIC ACID, ED: Lactic Acid, Venous: 1.37 mmol/L (ref 0.5–1.9)

## 2016-05-25 MED ORDER — CLINDAMYCIN HCL 300 MG PO CAPS: 300.0000 mg | ORAL_CAPSULE | Freq: Three times a day (TID) | ORAL | 0 refills | Status: DC

## 2016-05-25 MED ORDER — IBUPROFEN 400 MG PO TABS
600.0000 mg | ORAL_TABLET | Freq: Once | ORAL | Status: AC
  Administered 2016-05-25: 600 mg via ORAL
  Filled 2016-05-25: qty 1

## 2016-05-25 MED ORDER — CLINDAMYCIN HCL 150 MG PO CAPS
600.0000 mg | ORAL_CAPSULE | Freq: Once | ORAL | Status: AC
  Administered 2016-05-25: 600 mg via ORAL
  Filled 2016-05-25: qty 4

## 2016-05-25 MED ORDER — OXYCODONE-ACETAMINOPHEN 5-325 MG PO TABS
1.0000 | ORAL_TABLET | Freq: Once | ORAL | Status: AC
  Administered 2016-05-25: 1 via ORAL
  Filled 2016-05-25: qty 1
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
## 2016-05-28 ENCOUNTER — Observation Stay (HOSPITAL_COMMUNITY): Payer: Medicare Other | Admitting: Anesthesiology

## 2016-05-28 ENCOUNTER — Encounter (HOSPITAL_COMMUNITY): Payer: Self-pay | Admitting: General Practice

## 2016-05-28 ENCOUNTER — Ambulatory Visit (INDEPENDENT_AMBULATORY_CARE_PROVIDER_SITE_OTHER): Payer: Medicare Other | Admitting: Family Medicine

## 2016-05-28 ENCOUNTER — Encounter: Payer: Self-pay | Admitting: Family Medicine

## 2016-05-28 ENCOUNTER — Other Ambulatory Visit: Payer: Self-pay | Admitting: Orthopedic Surgery

## 2016-05-28 ENCOUNTER — Encounter (HOSPITAL_COMMUNITY)
Admission: AD | Disposition: A | Discharge status: Home / Self Care | Payer: Self-pay | Source: Ambulatory Visit | Attending: Family Medicine

## 2016-05-28 ENCOUNTER — Observation Stay (HOSPITAL_COMMUNITY)
Admission: AD | Admit: 2016-05-28 | Discharge: 2016-05-29 | Disposition: A | Discharge status: Home / Self Care | Payer: Medicare Other | Source: Ambulatory Visit | Attending: Family Medicine | Admitting: Family Medicine

## 2016-05-28 VITALS — BP 149/82 | HR 65 | Temp 98.3°F | Ht 68.0 in | Wt 188.0 lb

## 2016-05-28 DIAGNOSIS — I10 Essential (primary) hypertension: Secondary | ICD-10-CM | POA: Insufficient documentation

## 2016-05-28 DIAGNOSIS — B9689 Other specified bacterial agents as the cause of diseases classified elsewhere: Secondary | ICD-10-CM | POA: Diagnosis not present

## 2016-05-28 DIAGNOSIS — L03011 Cellulitis of right finger: Secondary | ICD-10-CM | POA: Diagnosis not present

## 2016-05-28 DIAGNOSIS — K219 Gastro-esophageal reflux disease without esophagitis: Secondary | ICD-10-CM | POA: Diagnosis not present

## 2016-05-28 DIAGNOSIS — S61252A Open bite of right middle finger without damage to nail, initial encounter: Secondary | ICD-10-CM | POA: Diagnosis not present

## 2016-05-28 DIAGNOSIS — E7800 Pure hypercholesterolemia, unspecified: Secondary | ICD-10-CM | POA: Diagnosis not present

## 2016-05-28 DIAGNOSIS — M545 Low back pain: Secondary | ICD-10-CM | POA: Diagnosis not present

## 2016-05-28 DIAGNOSIS — E785 Hyperlipidemia, unspecified: Secondary | ICD-10-CM | POA: Diagnosis not present

## 2016-05-28 DIAGNOSIS — R011 Cardiac murmur, unspecified: Secondary | ICD-10-CM | POA: Diagnosis not present

## 2016-05-28 DIAGNOSIS — W503XXA Accidental bite by another person, initial encounter: Secondary | ICD-10-CM | POA: Diagnosis not present

## 2016-05-28 DIAGNOSIS — Z6828 Body mass index (BMI) 28.0-28.9, adult: Secondary | ICD-10-CM | POA: Insufficient documentation

## 2016-05-28 DIAGNOSIS — E669 Obesity, unspecified: Secondary | ICD-10-CM | POA: Insufficient documentation

## 2016-05-28 DIAGNOSIS — L03113 Cellulitis of right upper limb: Secondary | ICD-10-CM

## 2016-05-28 DIAGNOSIS — G8929 Other chronic pain: Secondary | ICD-10-CM | POA: Diagnosis not present

## 2016-05-28 DIAGNOSIS — R7303 Prediabetes: Secondary | ICD-10-CM | POA: Insufficient documentation

## 2016-05-28 DIAGNOSIS — Z7982 Long term (current) use of aspirin: Secondary | ICD-10-CM | POA: Insufficient documentation

## 2016-05-28 DIAGNOSIS — L02511 Cutaneous abscess of right hand: Secondary | ICD-10-CM | POA: Diagnosis not present

## 2016-05-28 DIAGNOSIS — M009 Pyogenic arthritis, unspecified: Secondary | ICD-10-CM | POA: Diagnosis not present

## 2016-05-28 DIAGNOSIS — L0390 Cellulitis, unspecified: Secondary | ICD-10-CM

## 2016-05-28 HISTORY — DX: Cardiac murmur, unspecified: R01.1

## 2016-05-28 HISTORY — DX: Gastro-esophageal reflux disease without esophagitis: K21.9

## 2016-05-28 HISTORY — DX: Prediabetes: R73.03

## 2016-05-28 HISTORY — PX: I&D EXTREMITY: SHX5045

## 2016-05-28 HISTORY — DX: Low back pain, unspecified: M54.50

## 2016-05-28 HISTORY — DX: Other chronic pain: G89.29

## 2016-05-28 HISTORY — DX: Low back pain: M54.5

## 2016-05-28 HISTORY — DX: Unspecified osteoarthritis, unspecified site: M19.90

## 2016-05-28 HISTORY — DX: Headache: R51

## 2016-05-28 HISTORY — DX: Claustrophobia: F40.240

## 2016-05-28 HISTORY — DX: Headache, unspecified: R51.9

## 2016-05-28 LAB — BASIC METABOLIC PANEL
Anion gap: 10 (ref 5–15)
BUN: 11 mg/dL (ref 6–20)
CHLORIDE: 103 mmol/L (ref 101–111)
CO2: 26 mmol/L (ref 22–32)
CREATININE: 0.64 mg/dL (ref 0.44–1.00)
Calcium: 10.3 mg/dL (ref 8.9–10.3)
GFR calc Af Amer: >60 mL/min (ref >60)
GFR calc non Af Amer: >60 mL/min (ref >60)
Glucose, Bld: 97 mg/dL (ref 65–99)
Potassium: 3.6 mmol/L (ref 3.5–5.1)
SODIUM: 139 mmol/L (ref 135–145)

## 2016-05-28 LAB — CBC WITH DIFFERENTIAL/PLATELET
Basophils Absolute: 0 10*3/uL (ref 0.0–0.1)
Basophils Relative: 0 %
EOS ABS: 0 10*3/uL (ref 0.0–0.7)
EOS PCT: 0 %
HCT: 38.7 % (ref 36.0–46.0)
HEMOGLOBIN: 12.6 g/dL (ref 12.0–15.0)
LYMPHS ABS: 3.2 10*3/uL (ref 0.7–4.0)
Lymphocytes Relative: 36 %
MCH: 31.5 pg (ref 26.0–34.0)
MCHC: 32.6 g/dL (ref 30.0–36.0)
MCV: 96.8 fL (ref 78.0–100.0)
MONOS PCT: 5 %
Monocytes Absolute: 0.4 10*3/uL (ref 0.1–1.0)
Neutro Abs: 5.3 10*3/uL (ref 1.7–7.7)
Neutrophils Relative %: 59 %
PLATELETS: 271 10*3/uL (ref 150–400)
RBC: 4 MIL/uL (ref 3.87–5.11)
RDW: 13.9 % (ref 11.5–15.5)
WBC: 9 10*3/uL (ref 4.0–10.5)

## 2016-05-28 SURGERY — IRRIGATION AND DEBRIDEMENT EXTREMITY
Anesthesia: General | Site: Hand | Laterality: Right

## 2016-05-28 MED ORDER — MIDAZOLAM HCL 2 MG/2ML IJ SOLN
INTRAMUSCULAR | Status: DC | PRN
  Administered 2016-05-28: 2 mg via INTRAVENOUS

## 2016-05-28 MED ORDER — HYDROCODONE-ACETAMINOPHEN 5-325 MG PO TABS
1.0000 | ORAL_TABLET | Freq: Four times a day (QID) | ORAL | Status: DC | PRN
  Administered 2016-05-28 – 2016-05-29 (×3): 2 via ORAL
  Filled 2016-05-28 (×3): qty 2

## 2016-05-28 MED ORDER — LIDOCAINE 2% (20 MG/ML) 5 ML SYRINGE
INTRAMUSCULAR | Status: DC | PRN
  Administered 2016-05-28: 100 mg via INTRAVENOUS

## 2016-05-28 MED ORDER — BUPIVACAINE HCL (PF) 0.25 % IJ SOLN
INTRAMUSCULAR | Status: AC
  Filled 2016-05-28: qty 30

## 2016-05-28 MED ORDER — ONDANSETRON HCL 4 MG/2ML IJ SOLN
INTRAMUSCULAR | Status: DC | PRN
  Administered 2016-05-28: 4 mg via INTRAVENOUS

## 2016-05-28 MED ORDER — FENTANYL CITRATE (PF) 100 MCG/2ML IJ SOLN
INTRAMUSCULAR | Status: AC
  Filled 2016-05-28: qty 4

## 2016-05-28 MED ORDER — ONDANSETRON 4 MG PO TBDP
4.0000 mg | ORAL_TABLET | Freq: Three times a day (TID) | ORAL | Status: DC | PRN
  Administered 2016-05-28: 4 mg via ORAL
  Filled 2016-05-28: qty 1

## 2016-05-28 MED ORDER — PROMETHAZINE HCL 25 MG/ML IJ SOLN: 6.2500 mg | INTRAMUSCULAR | Status: DC | PRN

## 2016-05-28 MED ORDER — BENAZEPRIL HCL 20 MG PO TABS
20.0000 mg | ORAL_TABLET | Freq: Every day | ORAL | Status: DC
  Administered 2016-05-29: 20 mg via ORAL
  Filled 2016-05-28: qty 1

## 2016-05-28 MED ORDER — ONDANSETRON HCL 4 MG/2ML IJ SOLN
INTRAMUSCULAR | Status: AC
  Filled 2016-05-28: qty 2

## 2016-05-28 MED ORDER — PANTOPRAZOLE SODIUM 40 MG PO TBEC
40.0000 mg | DELAYED_RELEASE_TABLET | Freq: Every day | ORAL | Status: DC
  Administered 2016-05-29: 40 mg via ORAL
  Filled 2016-05-28: qty 1

## 2016-05-28 MED ORDER — ASPIRIN EC 81 MG PO TBEC
81.0000 mg | DELAYED_RELEASE_TABLET | Freq: Every day | ORAL | Status: DC
Start: 2016-05-29 — End: 2016-05-29
  Administered 2016-05-29: 81 mg via ORAL
  Filled 2016-05-28: qty 1

## 2016-05-28 MED ORDER — HYDROMORPHONE HCL 1 MG/ML IJ SOLN
0.2500 mg | INTRAMUSCULAR | Status: DC | PRN
  Administered 2016-05-28 (×2): 0.5 mg via INTRAVENOUS

## 2016-05-28 MED ORDER — METRONIDAZOLE IN NACL 5-0.79 MG/ML-% IV SOLN
500.0000 mg | Freq: Three times a day (TID) | INTRAVENOUS | Status: DC
  Administered 2016-05-29 (×2): 500 mg via INTRAVENOUS
  Filled 2016-05-28 (×2): qty 100

## 2016-05-28 MED ORDER — ENOXAPARIN SODIUM 40 MG/0.4ML ~~LOC~~ SOLN
40.0000 mg | SUBCUTANEOUS | Status: DC
  Administered 2016-05-29: 40 mg via SUBCUTANEOUS
  Filled 2016-05-28: qty 0.4

## 2016-05-28 MED ORDER — PROPOFOL 10 MG/ML IV BOLUS
INTRAVENOUS | Status: DC | PRN
  Administered 2016-05-28: 160 mg via INTRAVENOUS

## 2016-05-28 MED ORDER — SODIUM CHLORIDE 0.9 % IR SOLN
Status: DC | PRN
  Administered 2016-05-28: 1000 mL

## 2016-05-28 MED ORDER — CYCLOBENZAPRINE HCL 10 MG PO TABS: 10.0000 mg | ORAL_TABLET | Freq: Every day | ORAL | Status: DC | PRN

## 2016-05-28 MED ORDER — PROPOFOL 10 MG/ML IV BOLUS
INTRAVENOUS | Status: AC
  Filled 2016-05-28: qty 20

## 2016-05-28 MED ORDER — CHLORHEXIDINE GLUCONATE 4 % EX LIQD
60.0000 mL | Freq: Once | CUTANEOUS | Status: DC
  Filled 2016-05-28: qty 60

## 2016-05-28 MED ORDER — VANCOMYCIN HCL IN DEXTROSE 1-5 GM/200ML-% IV SOLN
1000.0000 mg | Freq: Two times a day (BID) | INTRAVENOUS | Status: DC
  Filled 2016-05-28 (×2): qty 200

## 2016-05-28 MED ORDER — LIDOCAINE 2% (20 MG/ML) 5 ML SYRINGE
INTRAMUSCULAR | Status: AC
  Filled 2016-05-28: qty 5

## 2016-05-28 MED ORDER — LACTATED RINGERS IV SOLN
INTRAVENOUS | Status: DC
  Administered 2016-05-28 (×2): via INTRAVENOUS

## 2016-05-28 MED ORDER — ATORVASTATIN CALCIUM 80 MG PO TABS
80.0000 mg | ORAL_TABLET | Freq: Every day | ORAL | Status: DC
Start: 2016-05-29 — End: 2016-05-29
  Administered 2016-05-29: 80 mg via ORAL
  Filled 2016-05-28: qty 1

## 2016-05-28 MED ORDER — BUPIVACAINE HCL (PF) 0.25 % IJ SOLN
INTRAMUSCULAR | Status: DC | PRN
  Administered 2016-05-28: 8 mL

## 2016-05-28 MED ORDER — BENAZEPRIL-HYDROCHLOROTHIAZIDE 20-12.5 MG PO TABS: 1.0000 | ORAL_TABLET | Freq: Every day | ORAL | Status: DC

## 2016-05-28 MED ORDER — HYDROCHLOROTHIAZIDE 12.5 MG PO CAPS
12.5000 mg | ORAL_CAPSULE | Freq: Every day | ORAL | Status: DC
  Administered 2016-05-29: 12.5 mg via ORAL
  Filled 2016-05-28: qty 1

## 2016-05-28 MED ORDER — MEPERIDINE HCL 25 MG/ML IJ SOLN: 6.2500 mg | INTRAMUSCULAR | Status: DC | PRN

## 2016-05-28 MED ORDER — TRAMADOL HCL 50 MG PO TABS
50.0000 mg | ORAL_TABLET | Freq: Two times a day (BID) | ORAL | Status: DC | PRN
  Administered 2016-05-28: 50 mg via ORAL
  Filled 2016-05-28: qty 1

## 2016-05-28 MED ORDER — DEXTROSE 5 % IV SOLN
2.0000 g | INTRAVENOUS | Status: DC
  Administered 2016-05-28: 2 g via INTRAVENOUS
  Filled 2016-05-28 (×2): qty 2

## 2016-05-28 MED ORDER — FENTANYL CITRATE (PF) 100 MCG/2ML IJ SOLN
INTRAMUSCULAR | Status: DC | PRN
  Administered 2016-05-28: 50 ug via INTRAVENOUS

## 2016-05-28 MED ORDER — HYDROMORPHONE HCL 1 MG/ML IJ SOLN
INTRAMUSCULAR | Status: AC
  Filled 2016-05-28: qty 1

## 2016-05-28 SURGICAL SUPPLY — 47 items
BANDAGE ELASTIC 3 VELCRO ST LF (GAUZE/BANDAGES/DRESSINGS) ×3 IMPLANT
BANDAGE ELASTIC 4 VELCRO ST LF (GAUZE/BANDAGES/DRESSINGS) ×3 IMPLANT
BNDG CMPR 9X4 STRL LF SNTH (GAUZE/BANDAGES/DRESSINGS) ×1
BNDG CONFORM 2 STRL LF (GAUZE/BANDAGES/DRESSINGS) IMPLANT
BNDG ESMARK 4X9 LF (GAUZE/BANDAGES/DRESSINGS) ×3 IMPLANT
BNDG GAUZE ELAST 4 BULKY (GAUZE/BANDAGES/DRESSINGS) ×6 IMPLANT
CORDS BIPOLAR (ELECTRODE) ×3 IMPLANT
COVER SURGICAL LIGHT HANDLE (MISCELLANEOUS) ×6 IMPLANT
CUFF TOURNIQUET SINGLE 18IN (TOURNIQUET CUFF) ×3 IMPLANT
DECANTER SPIKE VIAL GLASS SM (MISCELLANEOUS) ×3 IMPLANT
DRAPE SURG 17X23 STRL (DRAPES) ×3 IMPLANT
DRSG PAD ABDOMINAL 8X10 ST (GAUZE/BANDAGES/DRESSINGS) ×6 IMPLANT
DURAPREP 26ML APPLICATOR (WOUND CARE) ×3 IMPLANT
ELECT REM PT RETURN 9FT ADLT (ELECTROSURGICAL)
ELECTRODE REM PT RTRN 9FT ADLT (ELECTROSURGICAL) IMPLANT
GAUZE SPONGE 4X4 12PLY STRL (GAUZE/BANDAGES/DRESSINGS) ×6 IMPLANT
GAUZE XEROFORM 1X8 LF (GAUZE/BANDAGES/DRESSINGS) ×3 IMPLANT
GLOVE BIO SURGEON STRL SZ 6.5 (GLOVE) ×2 IMPLANT
GLOVE BIO SURGEONS STRL SZ 6.5 (GLOVE) ×1
GLOVE SURG SYN 8.0 (GLOVE) ×3 IMPLANT
GOWN STRL REUS W/ TWL LRG LVL3 (GOWN DISPOSABLE) ×1 IMPLANT
GOWN STRL REUS W/ TWL XL LVL3 (GOWN DISPOSABLE) ×1 IMPLANT
GOWN STRL REUS W/TWL LRG LVL3 (GOWN DISPOSABLE) ×3
GOWN STRL REUS W/TWL XL LVL3 (GOWN DISPOSABLE) ×3
KIT BASIN OR (CUSTOM PROCEDURE TRAY) ×3 IMPLANT
KIT ROOM TURNOVER OR (KITS) ×3 IMPLANT
MANIFOLD NEPTUNE II (INSTRUMENTS) ×3 IMPLANT
NEEDLE HYPO 25GX1X1/2 BEV (NEEDLE) ×3 IMPLANT
NEEDLE HYPO 25X1 1.5 SAFETY (NEEDLE) ×3 IMPLANT
NS IRRIG 1000ML POUR BTL (IV SOLUTION) ×3 IMPLANT
PACK ORTHO EXTREMITY (CUSTOM PROCEDURE TRAY) ×3 IMPLANT
PAD ARMBOARD 7.5X6 YLW CONV (MISCELLANEOUS) ×6 IMPLANT
PAD CAST 4YDX4 CTTN HI CHSV (CAST SUPPLIES) ×2 IMPLANT
PADDING CAST COTTON 4X4 STRL (CAST SUPPLIES) ×6
SPONGE LAP 18X18 X RAY DECT (DISPOSABLE) ×3 IMPLANT
SUCTION FRAZIER HANDLE 10FR (MISCELLANEOUS) ×2
SUCTION TUBE FRAZIER 10FR DISP (MISCELLANEOUS) ×1 IMPLANT
SUT ETHILON 4 0 PS 2 18 (SUTURE) ×3 IMPLANT
SYR CONTROL 10ML LL (SYRINGE) ×3 IMPLANT
TOWEL OR 17X24 6PK STRL BLUE (TOWEL DISPOSABLE) ×3 IMPLANT
TOWEL OR 17X26 10 PK STRL BLUE (TOWEL DISPOSABLE) ×3 IMPLANT
TUBE ANAEROBIC SPECIMEN COL (MISCELLANEOUS) ×3 IMPLANT
TUBE CONNECTING 12'X1/4 (SUCTIONS) ×1
TUBE CONNECTING 12X1/4 (SUCTIONS) ×2 IMPLANT
UNDERPAD 30X30 INCONTINENT (UNDERPADS AND DIAPERS) ×3 IMPLANT
WATER STERILE IRR 1000ML POUR (IV SOLUTION) ×3 IMPLANT
YANKAUER SUCT BULB TIP NO VENT (SUCTIONS) ×3 IMPLANT

## 2016-05-28 NOTE — Anesthesia Procedure Notes (Signed)
Procedure Name: LMA Insertion Date/Time: 05/28/2016 2:45 PM Performed by: Trixie Deis A Pre-anesthesia Checklist: Patient identified, Emergency Drugs available, Suction available and Patient being monitored Patient Re-evaluated:Patient Re-evaluated prior to inductionOxygen Delivery Method: Circle System Utilized Preoxygenation: Pre-oxygenation with 100% oxygen Intubation Type: IV induction Ventilation: Mask ventilation without difficulty LMA: LMA inserted LMA Size: 4.0 Number of attempts: 1 Airway Equipment and Method: Bite block Placement Confirmation: positive ETCO2 Tube secured with: Tape Dental Injury: Teeth and Oropharynx as per pre-operative assessment
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
## 2016-05-28 NOTE — Anesthesia Preprocedure Evaluation (Addendum)
Anesthesia Evaluation  Patient identified by MRN, date of birth, ID band Patient awake    Reviewed: NPO status   Airway Mallampati: II  TM Distance: >3 FB Neck ROM: Full    Dental  (+) Poor Dentition, Missing   Pulmonary shortness of breath,    breath sounds clear to auscultation       Cardiovascular hypertension, + PND   Rhythm:Regular Rate:Normal     Neuro/Psych    GI/Hepatic Neg liver ROS, GERD  ,  Endo/Other  negative endocrine ROS  Renal/GU negative Renal ROS     Musculoskeletal   Abdominal (+) + obese,   Peds  Hematology negative hematology ROS (+)   Anesthesia Other Findings   Reproductive/Obstetrics                            Anesthesia Physical Anesthesia Plan  ASA: III  Anesthesia Plan: General   Post-op Pain Management:    Induction: Intravenous  Airway Management Planned: LMA  Additional Equipment:   Intra-op Plan:   Post-operative Plan: Extubation in OR  Informed Consent: I have reviewed the patients History and Physical, chart, labs and discussed the procedure including the risks, benefits and alternatives for the proposed anesthesia with the patient or authorized representative who has indicated his/her understanding and acceptance.     Plan Discussed with: CRNA  Anesthesia Plan Comments:        Anesthesia Quick Evaluation
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
## 2016-05-28 NOTE — Transfer of Care (Signed)
Immediate Anesthesia Transfer of Care Note  Patient: Annette Rios  Procedure(s) Performed: Procedure(s): IRRIGATION AND DEBRIDEMENT EXTREMITY (Right)  Patient Location: PACU  Anesthesia Type:General  Level of Consciousness: awake, alert  and oriented  Airway & Oxygen Therapy: Patient Spontanous Breathing and Patient connected to nasal cannula oxygen  Post-op Assessment: Report given to RN, Post -op Vital signs reviewed and stable and Patient moving all extremities  Post vital signs: Reviewed and stable  Last Vitals:  Vitals:   05/28/16 1336  BP: 139/81  Pulse: 65  Resp: 20  Temp: 36.9 C    Last Pain:  Vitals:   05/28/16 1401  TempSrc:   PainSc: 8       Patients Stated Pain Goal: 4 ([id number])  Complications: No apparent anesthesia complications
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
## 2016-05-28 NOTE — Op Note (Signed)
See note [phone]
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
## 2016-05-28 NOTE — Anesthesia Postprocedure Evaluation (Signed)
Anesthesia Post Note  Patient: Annette Rios  Procedure(s) Performed: Procedure(s) (LRB): IRRIGATION AND DEBRIDEMENT EXTREMITY (Right)  Patient location during evaluation: PACU Anesthesia Type: General Level of consciousness: sedated and patient cooperative Pain management: pain level controlled Vital Signs Assessment: post-procedure vital signs reviewed and stable Respiratory status: spontaneous breathing Cardiovascular status: stable Anesthetic complications: no    Last Vitals:  Vitals:   05/28/16 1815 05/28/16 1828  BP:  112/67  Pulse:  (!) 48  Resp:  18  Temp: 36.3 C 36.5 C    Last Pain:  Vitals:   05/28/16 1815  TempSrc:   PainSc: [place]

## 2016-05-29 ENCOUNTER — Encounter (HOSPITAL_COMMUNITY): Payer: Self-pay | Admitting: Orthopedic Surgery

## 2016-05-29 DIAGNOSIS — L03011 Cellulitis of right finger: Secondary | ICD-10-CM | POA: Diagnosis not present

## 2016-05-29 DIAGNOSIS — I10 Essential (primary) hypertension: Secondary | ICD-10-CM | POA: Diagnosis not present

## 2016-05-29 DIAGNOSIS — S61252A Open bite of right middle finger without damage to nail, initial encounter: Secondary | ICD-10-CM | POA: Diagnosis not present

## 2016-05-29 DIAGNOSIS — E7800 Pure hypercholesterolemia, unspecified: Secondary | ICD-10-CM | POA: Diagnosis not present

## 2016-05-29 DIAGNOSIS — K219 Gastro-esophageal reflux disease without esophagitis: Secondary | ICD-10-CM | POA: Diagnosis not present

## 2016-05-29 DIAGNOSIS — L03113 Cellulitis of right upper limb: Secondary | ICD-10-CM | POA: Diagnosis not present

## 2016-05-29 DIAGNOSIS — R7303 Prediabetes: Secondary | ICD-10-CM | POA: Diagnosis not present

## 2016-05-29 LAB — CBC
HCT: 35.1 % — ABNORMAL LOW (ref 36.0–46.0)
Hemoglobin: 11.3 g/dL — ABNORMAL LOW (ref 12.0–15.0)
MCH: 31.5 pg (ref 26.0–34.0)
MCHC: 32.2 g/dL (ref 30.0–36.0)
MCV: 97.8 fL (ref 78.0–100.0)
Platelets: 238 10*3/uL (ref 150–400)
RBC: 3.59 MIL/uL — ABNORMAL LOW (ref 3.87–5.11)
RDW: 14 % (ref 11.5–15.5)
WBC: 8.9 10*3/uL (ref 4.0–10.5)

## 2016-05-29 LAB — HEMOGLOBIN A1C
HEMOGLOBIN A1C: 7 % — AB (ref 4.8–5.6)
Mean Plasma Glucose: 154 mg/dL

## 2016-05-29 MED ORDER — CEPHALEXIN 500 MG PO CAPS: 500.0000 mg | ORAL_CAPSULE | Freq: Two times a day (BID) | ORAL | Status: DC

## 2016-05-29 MED ORDER — ESOMEPRAZOLE MAGNESIUM 40 MG PO CPDR: 40.0000 mg | DELAYED_RELEASE_CAPSULE | Freq: Every day | ORAL | Status: DC | PRN

## 2016-05-29 MED ORDER — METRONIDAZOLE 500 MG PO TABS: 500.0000 mg | ORAL_TABLET | Freq: Four times a day (QID) | ORAL | 0 refills | Status: DC

## 2016-05-29 MED ORDER — CEPHALEXIN 500 MG PO CAPS: 500.0000 mg | ORAL_CAPSULE | Freq: Two times a day (BID) | ORAL | 0 refills | Status: DC

## 2016-05-29 MED ORDER — CEPHALEXIN 500 MG PO CAPS: 500.0000 mg | ORAL_CAPSULE | Freq: Once | ORAL | Status: DC

## 2016-05-29 MED ORDER — METRONIDAZOLE 500 MG PO TABS: 500.0000 mg | ORAL_TABLET | Freq: Four times a day (QID) | ORAL | Status: DC

## 2016-05-29 NOTE — Progress Notes (Signed)
Family Medicine Teaching Service Daily Progress Note Intern Pager: [phone]  Patient name: Annette Rios Medical record number: [id number] Date of birth: 1956-07-13 Age: 60 y.o. Gender: female  Primary Care Provider: Dorcas Mcmurray, MD Consultants: Hand orthopedics Code Status: FULL  Pt Overview and Major Events to Date:  8/23 Admit 8/23 I&D hand by ortho  Assessment and Plan: Annette Rios is a 60 y.o. female presenting with with worsening R hand cellulitis despite outpatient clindamycin. PMH is significant for prediabetes, GERD, chronic back pain, HLD.  #Cellulitis: treated as purulent given patient's reported history of expressing yellow pus, unable to express pus on exam and difficult to determine if there is a drainable abscess currently due to pain. Does not meet sepsis criteria and is hemodynamically stable. While she did have pain/difficulty with complete extension of her 2-4th digits on the R hand, she does not have uniform swelling up the fingers to suspect flexor tenosynovitis. She will be treated as an outpatient management failure given <72 hrs with antibiotics. Cover for MRSA, aneorobes, and Pseudomonas given the purulence, mechanism of injury, the fact that she's pre-diabetic. -Bcx pending -s/p hand ortho I&D 8/23. Appreciate recs. D/C on antibiotics covering human oral flora , to see in clinic Friday 8/25 for follow up appointment. -Norco 5-325 Q6 prn for pain -D/C IV CTX and IV Flagyl, transition to po abx today to see if tolerating -Anticipate d/c tomorrow on po Flagyl 500 mg Q6 x 10 days and po Keflex 500 mg Q12 x 10days.  #Prediabetes: Last A1c was 6.3. - A1c on 8/23 7.0 -consider outpatient follow up, starting Metformin vs lifestyle modifications.  #HTN: slightly elevated on admission, resolved. BP this am 132/66. - continue benazepril-HCTZ 20-12.5 - continue to monitor   #GERD: stable - continue Protonix  #HLD:  - continue Lipitor and ASA  #Chronic  pain: stable -continue PRN tramadol and Flexeril  FEN/GI: cardiac diet  Prophylaxis: Lovenox   Disposition: Likely discharge home tomorrow morning on po antibiotics.  Subjective:  Patient complaining of right hand soreness and pain following I&D yesterday. States she would not feel comfortable going home today and she wants to make sure her hand is okay before she leaves hospital. No other complaints at this time.  Objective: Temp:  [97.3 F (36.3 C)-98.5 F (36.9 C)] 97.7 F (36.5 C) (08/24 0500) Pulse Rate:  [45-65] 58 (08/24 0500) Resp:  [9-20] 18 (08/24 0500) BP: (92-149)/(63-88) 132/66 (08/24 0815) SpO2:  [90 %-100 %] 100 % (08/24 0500) Weight:  [188 lb (85.3 kg)] 188 lb (85.3 kg) (08/23 1025)   Physical Exam: General: Sitting comfortably on hospital bed in NAD Eyes: Conjunctivae non-injected  ENTM: Moist mucous membranes, oropharynx clear. No nasal discharge. Neck: Supple, no LAD. Cardiovascular: RRR. No murmurs, rubs, or gallops noted. No pitting edema noted. Respiratory: No increased WOB. CTAB without wheezing, rhonchi, or crackles noted. Abdomen: +BS, soft, non-distended, non-tender MSK: right hand in compression wrap following I&D procedure  Laboratory:  Recent Labs Lab 05/25/16 1255 05/28/16 1312 05/29/16 0523  WBC 6.5 9.0 8.9  HGB 12.9 12.6 11.3*  HCT 39.7 38.7 35.1*  PLT 263 271 238    Recent Labs Lab 05/25/16 1255 05/28/16 1312  NA 137 139  K 3.6 3.6  CL 104 103  CO2 28 26  BUN 9 11  CREATININE 0.64 0.64  CALCIUM 9.9 10.3  PROT 7.3  --   BILITOT 1.2  --   ALKPHOS 38  --   ALT 30  --  AST 24  --   GLUCOSE 120* 97   Imaging/Diagnostic Tests: None  Annette Kim, MD 05/29/2016, 8:21 AM PGY-1, [place] Intern pager: [phone], text pages welcome

## 2016-05-29 NOTE — Discharge Instructions (Signed)
You were admitted to the hospital for a right hand wound which was infected. We had the hand surgeon operate on it and drain it. You were given IV antibiotics while you were here and then transitioned to oral antibiotics. You will need to continue the oral antibiotics as prescribed for a total of 10 days. You have a follow up appointment with the hand surgeon upon leaving the hospital.  Please see Dr. Bertis Ruddy office Friday August 25th at 2:15pm  to remove your packing and do whirlpool therapy. [facility] is at [place], [place] [address].   You can use over the counter Ibuprofen as needed for pain.

## 2016-05-29 NOTE — Care Management Note (Addendum)
Case Management Note  Patient Details  Name: Annette Rios MRN: [id number] Date of Birth: 18-Feb-1956  Subjective/Objective:     Presents with with worsening R hand cellulitis, PMH is significant for prediabetes, GERD, chronic back pain, HLD.Resides with niece, [name]. Independent with ADL's PTA. No DME usage.  - s/p I&D R hand 05/28/2016  PCP: Dorcas Mcmurray   Action/Plan: Return to home when medically stable. CM to f/u with disposition needs.  Expected Discharge Date:                  Expected Discharge Plan:  Home/Self Care  In-House Referral:     Discharge planning Services  CM Consult  Post Acute Care Choice:    Choice offered to:     DME Arranged:    DME Agency:     HH Arranged:    HH Agency:     Status of Service:  In process, will continue to follow  If discussed at Long Length of Stay Meetings, dates discussed:    Additional Comments:  Sharin Mons, RN 05/29/2016, 2:14 PM

## 2016-05-29 NOTE — Progress Notes (Signed)
Annette Rios to be D/C'd to home per MD order.  Discussed with the patient and all questions fully answered.  VSS, Skin clean, dry and intact without evidence of skin break down, no evidence of skin tears noted. IV catheter discontinued intact. Site without signs and symptoms of complications. Dressing and pressure applied.  An After Visit Summary was printed and given to the patient. Patient received prescription.  D/c education completed with patient/family including follow up instructions, medication list, d/c activities limitations if indicated, with other d/c instructions as indicated by MD - patient able to verbalize understanding, all questions fully answered.   Patient instructed to return to ED, call 911, or call MD for any changes in condition.   Patient escorted via [place], and D/C home via private auto.  [name] 05/29/2016 6:18 PM

## 2016-05-29 NOTE — Care Management Obs Status (Signed)
MEDICARE OBSERVATION STATUS NOTIFICATION   Patient Details  Name: LAURETTA BRITTAN MRN: [id number] Date of Birth: 1956/06/01   Medicare Observation Status Notification Given:       Sharin Mons, RN 05/29/2016, 2:39 PM
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
## 2016-05-29 NOTE — Progress Notes (Signed)
Plan is for pt to d/c to home today assuming care for self with help from family if needed. No further needs noted per CM. Whitman Hero RN,BSN,CM

## 2016-05-30 DIAGNOSIS — M79641 Pain in right hand: Secondary | ICD-10-CM | POA: Diagnosis not present

## 2016-05-30 DIAGNOSIS — M25441 Effusion, right hand: Secondary | ICD-10-CM | POA: Diagnosis not present

## 2016-05-30 DIAGNOSIS — S61401D Unspecified open wound of right hand, subsequent encounter: Secondary | ICD-10-CM | POA: Diagnosis not present

## 2016-05-30 DIAGNOSIS — L089 Local infection of the skin and subcutaneous tissue, unspecified: Secondary | ICD-10-CM | POA: Diagnosis not present
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
## 2016-06-02 DIAGNOSIS — L089 Local infection of the skin and subcutaneous tissue, unspecified: Secondary | ICD-10-CM | POA: Diagnosis not present

## 2016-06-02 DIAGNOSIS — M79641 Pain in right hand: Secondary | ICD-10-CM | POA: Diagnosis not present

## 2016-06-02 DIAGNOSIS — S61401D Unspecified open wound of right hand, subsequent encounter: Secondary | ICD-10-CM | POA: Diagnosis not present

## 2016-06-02 DIAGNOSIS — M25441 Effusion, right hand: Secondary | ICD-10-CM | POA: Diagnosis not present

## 2016-06-04 ENCOUNTER — Inpatient Hospital Stay: Payer: Medicare Other | Admitting: Family Medicine

## 2016-06-04 LAB — CULTURE, BLOOD (ROUTINE X 2)
Culture: NO GROWTH
Culture: NO GROWTH

## 2016-06-04 LAB — AEROBIC/ANAEROBIC CULTURE W GRAM STAIN (SURGICAL/DEEP WOUND)

## 2016-06-05 ENCOUNTER — Telehealth: Payer: Self-pay | Admitting: *Deleted

## 2016-06-05 NOTE — Telephone Encounter (Signed)
Pt was given an antibiotic after her hand surgery and she wants to know if dr would send her in antibiotic for yeast infection and new meter for her glucose. Please advise. Rahaf Carbonell Kennon Holter, CMA

## 2016-06-06 DIAGNOSIS — M25441 Effusion, right hand: Secondary | ICD-10-CM | POA: Diagnosis not present

## 2016-06-06 DIAGNOSIS — M79641 Pain in right hand: Secondary | ICD-10-CM | POA: Diagnosis not present

## 2016-06-06 DIAGNOSIS — S61401D Unspecified open wound of right hand, subsequent encounter: Secondary | ICD-10-CM | POA: Diagnosis not present

## 2016-06-06 DIAGNOSIS — L089 Local infection of the skin and subcutaneous tissue, unspecified: Secondary | ICD-10-CM | POA: Diagnosis not present

## 2016-06-06 MED ORDER — FLUCONAZOLE 100 MG PO TABS: ORAL_TABLET | ORAL | 1 refills | Status: DC

## 2016-06-06 NOTE — Telephone Encounter (Signed)
I have called in her Rx for yeast. Tell her I wantto see her in office in a couple of weeks and we can check on  The glucose meter then [facility]! Annette Rios

## 2016-06-06 NOTE — Telephone Encounter (Signed)
Spoke to pt. Made an appt for Sept 27th. Ottis Stain, CMA

## 2016-06-10 DIAGNOSIS — L089 Local infection of the skin and subcutaneous tissue, unspecified: Secondary | ICD-10-CM | POA: Diagnosis not present

## 2016-06-10 DIAGNOSIS — M79641 Pain in right hand: Secondary | ICD-10-CM | POA: Diagnosis not present

## 2016-06-10 DIAGNOSIS — S61401D Unspecified open wound of right hand, subsequent encounter: Secondary | ICD-10-CM | POA: Diagnosis not present

## 2016-06-10 DIAGNOSIS — M25641 Stiffness of right hand, not elsewhere classified: Secondary | ICD-10-CM | POA: Diagnosis not present

## 2016-06-17 DIAGNOSIS — S61401D Unspecified open wound of right hand, subsequent encounter: Secondary | ICD-10-CM | POA: Diagnosis not present

## 2016-06-17 DIAGNOSIS — M79641 Pain in right hand: Secondary | ICD-10-CM | POA: Diagnosis not present

## 2016-06-17 DIAGNOSIS — L089 Local infection of the skin and subcutaneous tissue, unspecified: Secondary | ICD-10-CM | POA: Diagnosis not present

## 2016-06-19 DIAGNOSIS — L089 Local infection of the skin and subcutaneous tissue, unspecified: Secondary | ICD-10-CM | POA: Diagnosis not present

## 2016-06-19 DIAGNOSIS — S61401D Unspecified open wound of right hand, subsequent encounter: Secondary | ICD-10-CM | POA: Diagnosis not present

## 2016-06-19 DIAGNOSIS — M25641 Stiffness of right hand, not elsewhere classified: Secondary | ICD-10-CM | POA: Diagnosis not present

## 2016-07-01 ENCOUNTER — Other Ambulatory Visit: Payer: Self-pay | Admitting: Family Medicine

## 2016-07-02 ENCOUNTER — Encounter: Payer: Self-pay | Admitting: Family Medicine

## 2016-07-02 ENCOUNTER — Ambulatory Visit (INDEPENDENT_AMBULATORY_CARE_PROVIDER_SITE_OTHER): Payer: Medicare Other | Admitting: Family Medicine

## 2016-07-02 ENCOUNTER — Other Ambulatory Visit: Payer: Self-pay | Admitting: Family Medicine

## 2016-07-02 DIAGNOSIS — R739 Hyperglycemia, unspecified: Secondary | ICD-10-CM

## 2016-07-02 DIAGNOSIS — E119 Type 2 diabetes mellitus without complications: Secondary | ICD-10-CM | POA: Diagnosis not present

## 2016-07-02 MED ORDER — CYCLOBENZAPRINE HCL 10 MG PO TABS: 10.0000 mg | ORAL_TABLET | Freq: Every day | ORAL | 5 refills | Status: DC | PRN

## 2016-07-02 MED ORDER — ATORVASTATIN CALCIUM 80 MG PO TABS: 80.0000 mg | ORAL_TABLET | Freq: Every day | ORAL | 0 refills | Status: DC

## 2016-07-02 MED ORDER — BENAZEPRIL-HYDROCHLOROTHIAZIDE 20-12.5 MG PO TABS: 1.0000 | ORAL_TABLET | Freq: Every day | ORAL | 3 refills | Status: DC

## 2016-07-02 NOTE — Telephone Encounter (Signed)
No pending meds Not sure what this is opened for Will close

## 2016-07-03 DIAGNOSIS — L089 Local infection of the skin and subcutaneous tissue, unspecified: Secondary | ICD-10-CM | POA: Diagnosis not present

## 2016-07-04 NOTE — Progress Notes (Signed)
    CHIEF COMPLAINT / HPI:   1. F/u hospitalization for hand/finger infection. Saw hand surgeon yesterday--doing well. Still canno fully extend. No fever, no more pain 2. Questions about blood sugar and how often she should check. Wants a glucometer. 3,. Problems swallowiing her cholesterol medicine---does it come in gel cap form.  REVIEW OF SYSTEMS:  No fever, no weight loss. No hand pain. No abdominal pain, no constipation, no swallowing problems other han her cholesterol medicine.  OBJECTIVE:  Vital signs are reviewed.  Vital signs reviewed. GENERAL: Well-developed, well-nourished, no acute distress. CARDIOVASCULAR: Regular rate and rhythm no murmur gallop or rub LUNGS: Clear to auscultation bilaterally, no rales or wheeze. ABDOMEN: Soft positive bowel sounds NEURO: No gross focal neurological deficits. MSK: Movement of extremity x 4. Right Hand is dressed wih bandage. Cannot fuly extend long finger.    ASSESSMENT / PLAN: Please see problem oriented charting for details

## 2016-07-04 NOTE — Assessment & Plan Note (Signed)
Her A1c has increased a bit. Glucometer rx given and we discussed diet and exercise. Greater than 50% of our 25 minute office visit was spent in counseling and education regarding these issues.

## 2016-08-02 ENCOUNTER — Other Ambulatory Visit: Payer: Self-pay | Admitting: Family Medicine

## 2016-08-04 NOTE — Telephone Encounter (Signed)
Dear [name] Team Please call in her tramadol with 2 refills THANKS! Dorcas Mcmurray

## 2016-08-07 NOTE — Telephone Encounter (Signed)
Called in Rx to pharmacy, spoke with Lissa Merlin who took the RX  and he stated that Amy was the pharmacist. Katharina Caper, Jaquaya Coyle D, CMA

## 2016-11-10 ENCOUNTER — Other Ambulatory Visit: Payer: Self-pay | Admitting: Family Medicine

## 2016-11-11 NOTE — Telephone Encounter (Signed)
Dear Annette Rios Team Please call in her tramadol as below   Please let her know I have called in a refill on her tramadol. Please ask her to use that as minimally as possible--I would rather she is taking 4 tabs a day only occasionally--I would lke her to try and get by with two tabs a day most days  Also. Please ask her to  Make an appoinment to see me in about 6-8 weeks for follow up [facility] Dorcas Mcmurray

## 2016-11-12 ENCOUNTER — Telehealth: Payer: Self-pay | Admitting: *Deleted

## 2016-11-12 NOTE — Telephone Encounter (Signed)
Called in RX from previous refill encounter per Dr. Nori Riis. Katharina Caper, April D, CMA

## 2016-11-12 NOTE — Telephone Encounter (Signed)
LVM for pt to call. If pt calls, please give her message from Dr. Nori Riis below. Ottis Stain, CMA

## 2016-11-12 NOTE — Telephone Encounter (Signed)
Pt informed of message below. She will check with daughter and call back to schedule an appt. Huel Centola, [name], CMA

## 2016-11-19 ENCOUNTER — Ambulatory Visit (INDEPENDENT_AMBULATORY_CARE_PROVIDER_SITE_OTHER): Payer: Medicare Other | Admitting: Family Medicine

## 2016-11-19 ENCOUNTER — Encounter: Payer: Self-pay | Admitting: Family Medicine

## 2016-11-19 VITALS — BP 124/84 | HR 88 | Temp 98.4°F | Ht 68.0 in | Wt 188.0 lb

## 2016-11-19 DIAGNOSIS — E7800 Pure hypercholesterolemia, unspecified: Secondary | ICD-10-CM | POA: Diagnosis not present

## 2016-11-19 DIAGNOSIS — K219 Gastro-esophageal reflux disease without esophagitis: Secondary | ICD-10-CM

## 2016-11-19 DIAGNOSIS — Z114 Encounter for screening for human immunodeficiency virus [HIV]: Secondary | ICD-10-CM | POA: Diagnosis not present

## 2016-11-19 DIAGNOSIS — Z90710 Acquired absence of both cervix and uterus: Secondary | ICD-10-CM | POA: Insufficient documentation

## 2016-11-19 DIAGNOSIS — Z0000 Encounter for general adult medical examination without abnormal findings: Secondary | ICD-10-CM

## 2016-11-19 DIAGNOSIS — Z1159 Encounter for screening for other viral diseases: Secondary | ICD-10-CM

## 2016-11-19 DIAGNOSIS — E119 Type 2 diabetes mellitus without complications: Secondary | ICD-10-CM

## 2016-11-19 DIAGNOSIS — I10 Essential (primary) hypertension: Secondary | ICD-10-CM

## 2016-11-19 DIAGNOSIS — R739 Hyperglycemia, unspecified: Secondary | ICD-10-CM | POA: Diagnosis present

## 2016-11-19 LAB — COMPREHENSIVE METABOLIC PANEL WITH GFR
ALT: 23 U/L (ref 6–29)
AST: 16 U/L (ref 10–35)
Albumin: 4.4 g/dL (ref 3.6–5.1)
Alkaline Phosphatase: 38 U/L (ref 33–130)
BUN: 10 mg/dL (ref 7–25)
CO2: 28 mmol/L (ref 20–31)
Calcium: 10.3 mg/dL (ref 8.6–10.4)
Chloride: 104 mmol/L (ref 98–110)
Creat: 0.71 mg/dL (ref 0.50–0.99)
Glucose, Bld: 104 mg/dL — ABNORMAL HIGH (ref 65–99)
Potassium: 4 mmol/L (ref 3.5–5.3)
Sodium: 140 mmol/L (ref 135–146)
Total Bilirubin: 0.7 mg/dL (ref 0.2–1.2)
Total Protein: 7.1 g/dL (ref 6.1–8.1)

## 2016-11-19 LAB — LDL CHOLESTEROL, DIRECT: Direct LDL: 67 mg/dL

## 2016-11-19 LAB — POCT GLYCOSYLATED HEMOGLOBIN (HGB A1C): Hemoglobin A1C: 6.4

## 2016-11-19 MED ORDER — BENAZEPRIL-HYDROCHLOROTHIAZIDE 20-12.5 MG PO TABS: 1.0000 | ORAL_TABLET | Freq: Every day | ORAL | 3 refills | Status: DC

## 2016-11-19 MED ORDER — CYCLOBENZAPRINE HCL 10 MG PO TABS: 10.0000 mg | ORAL_TABLET | Freq: Every day | ORAL | 3 refills | Status: DC | PRN

## 2016-11-19 NOTE — Patient Instructions (Signed)
I am getting all of the screening blood work recommended for your age group including Hepatitis C and HIV. I am also getting your regular labs and I will send you a note about them  If youhave not heard about your appintment for the GI (colon) dosctor in 2 weeks, please let me know  You should probably schedule an appointment to be seen by your eye doctor.  I will plan on seeing you in 6 months Great to see you!

## 2016-11-20 ENCOUNTER — Telehealth: Payer: Self-pay | Admitting: *Deleted

## 2016-11-20 LAB — HEPATITIS C ANTIBODY: HCV AB: NEGATIVE

## 2016-11-20 LAB — HIV ANTIBODY (ROUTINE TESTING W REFLEX): HIV 1&2 Ab, 4th Generation: NONREACTIVE

## 2016-11-20 NOTE — Telephone Encounter (Signed)
Pt is requesting a handicap application placard be mailed to her. She forgot to ask for one today. [name], [name], CMA

## 2016-11-21 NOTE — Telephone Encounter (Signed)
Mailed form and notified pt. Ottis Stain, CMA

## 2016-11-21 NOTE — Assessment & Plan Note (Signed)
Continue current medication regimen

## 2016-11-21 NOTE — Progress Notes (Signed)
    CHIEF COMPLAINT / HPI: #1. Hypertension: Taking medicines regularly without any problems. Has not had headache, no chest pain, no change in energy level, no lower extremity edema and some #2. Hypercholesterolemia: She is tolerating her medication. She's not been following a particularly good diet by her report. No abdominal pain. #3. Reflux: Seems well-controlled on current medication regimen. She does need some refills. #4. Follow-up right finger infection. She has completed her hand therapy and still lacks some extension of the third finger. She's doing some exercises at home for this. Overall she's quite pleased with the result however.    REVIEW OF SYSTEMS:  See history of present illness   OBJECTIVE:   Vital signs reviewed. GENERAL: Well-developed, well-nourished, no acute distress. CARDIOVASCULAR: Regular rate and rhythm no murmur gallop or rub LUNGS: Clear to auscultation bilaterally, no rales or wheeze. ABDOMEN: Soft positive bowel sounds NEURO: No gross focal neurological deficits. MSK: Movement of extremity x 4.    ASSESSMENT / PLAN: Please see problem oriented charting for details

## 2016-11-21 NOTE — Assessment & Plan Note (Signed)
Refills

## 2016-11-28 ENCOUNTER — Encounter: Payer: Self-pay | Admitting: Family Medicine

## 2017-02-10 ENCOUNTER — Other Ambulatory Visit: Payer: Self-pay | Admitting: Family Medicine

## 2017-02-10 DIAGNOSIS — Z1231 Encounter for screening mammogram for malignant neoplasm of breast: Secondary | ICD-10-CM

## 2017-02-20 ENCOUNTER — Other Ambulatory Visit: Payer: Self-pay | Admitting: Family Medicine

## 2017-02-23 ENCOUNTER — Ambulatory Visit
Admission: RE | Admit: 2017-02-23 | Discharge: 2017-02-23 | Disposition: A | Discharge status: Home / Self Care | Payer: Medicare Other | Source: Ambulatory Visit | Attending: Family Medicine | Admitting: Family Medicine

## 2017-02-23 DIAGNOSIS — Z1231 Encounter for screening mammogram for malignant neoplasm of breast: Secondary | ICD-10-CM

## 2017-03-06 ENCOUNTER — Other Ambulatory Visit: Payer: Self-pay | Admitting: Family Medicine

## 2017-03-06 NOTE — Telephone Encounter (Signed)
Dear [name] Team Please call in tramadol THANKS! Dorcas Mcmurray

## 2017-03-10 NOTE — Telephone Encounter (Signed)
Rx phoned into pharmacy per message below. Katharina Caper, Marcellina Jonsson D, [name]

## 2017-04-17 ENCOUNTER — Telehealth: Payer: Self-pay | Admitting: Family Medicine

## 2017-04-17 NOTE — Telephone Encounter (Signed)
Message given to pt

## 2017-04-17 NOTE — Telephone Encounter (Signed)
Pt states she was suppose to call PCP back if she hadn't heard from GI about a colonoscopy. Pt hasn't heard anything and would like to know who to call to schedule an appointment. ep

## 2017-04-17 NOTE — Telephone Encounter (Signed)
LVM for pt to call office back to inform her that she should be able to contact any of the GI doctors to set up an appointment for her colonoscopy.  The choices are  Velora Heckler [phone] [facility] [phone]  Please let her know that if she has any problems getting it scheduled then she should call us back and we can have Dr. Nori Riis put an order in for this. Katharina Caper, Srihan Brutus D, [name]

## 2017-04-24 ENCOUNTER — Telehealth: Payer: Self-pay | Admitting: Family Medicine

## 2017-04-24 NOTE — Telephone Encounter (Signed)
Pt is calling because she is getting a colonoscopy done at [facility]. They told her to tell her doctor to fax over a paper filled out and faxed back to them. She didn't know what kind of paper, so she is giving the number to call so that we can figure out what needs to be filled out and sent. [phone] and their fax number [phone]. Blima Rich

## 2017-04-27 NOTE — Telephone Encounter (Signed)
Checking to see if PCP know what paper they are referencing or if it is just a referral that needs to be faxed. Annette Rios, Annette Rios, [name]

## 2017-04-28 NOTE — Telephone Encounter (Signed)
[name] Do you know anything about this? I don't  Let me know if you don't and I will have someone call her THANKS! Annette Rios

## 2017-04-29 NOTE — Telephone Encounter (Signed)
Not sure what paper the patient is referring to. I have no done any referrals for this patient ever.

## 2017-05-01 ENCOUNTER — Telehealth: Payer: Self-pay | Admitting: Family Medicine

## 2017-05-01 DIAGNOSIS — Z1211 Encounter for screening for malignant neoplasm of colon: Secondary | ICD-10-CM

## 2017-05-01 NOTE — Telephone Encounter (Signed)
RN team Please see previous telephone encounter (which I had to close because EPIC is a witchy system today). Anyway, can you call her and see what the heck she is talking about? I gave her some info on getting a colonoscopy for screening--I suspect she somehow ended up at a [facility] practice and now they are needing some kind of order.  That MAY be what is going on.   THANKS! THANKS! THANKS! Dorcas Mcmurray

## 2017-05-06 NOTE — Telephone Encounter (Signed)
Called patient to inquire about a letter that is needed for colonoscopy.  Per patient they are needing a referral or an order to have the procedure completed.  Patient called [facility] provider Dr. Dellis Filbert [facility] office here in [place].  Office number [phone], fax [phone].  Once they receive the referral or order they will call patient to schedule the appointment.  Derl Barrow, RN

## 2017-05-06 NOTE — Telephone Encounter (Signed)
[name] I am putting in a referral for GI---is that all I need to do for this? I guess with [facility] in town it is getting complicated. Let me know if I need to do anything else [facility]! Dorcas Mcmurray

## 2017-05-15 ENCOUNTER — Telehealth: Payer: Self-pay | Admitting: Family Medicine

## 2017-05-15 NOTE — Telephone Encounter (Signed)
Pt lost the renewal form for the handicap placard and needs another one. ep

## 2017-05-16 ENCOUNTER — Other Ambulatory Visit: Payer: Self-pay | Admitting: Family Medicine

## 2017-05-18 NOTE — Telephone Encounter (Signed)
Form filled out as much as possible. Putting form in Dr. Verlon Au box. Patient will need to add her drivers license number and sign form before leaving clinic. Ottis Stain, CMA

## 2017-06-01 DIAGNOSIS — Z719 Counseling, unspecified: Secondary | ICD-10-CM | POA: Diagnosis not present

## 2017-06-03 NOTE — Telephone Encounter (Signed)
LVM for pt to call the office. See note below. Ottis Stain, CMA

## 2017-06-03 NOTE — Telephone Encounter (Signed)
Dear [name] Team She does not qualify for handicap form (unless she has new diagnosis I don't know about.) Please let her know THANKS! Dorcas Mcmurray

## 2017-06-11 DIAGNOSIS — Z9049 Acquired absence of other specified parts of digestive tract: Secondary | ICD-10-CM | POA: Diagnosis not present

## 2017-06-11 DIAGNOSIS — Z980 Intestinal bypass and anastomosis status: Secondary | ICD-10-CM | POA: Diagnosis not present

## 2017-06-11 DIAGNOSIS — Z86010 Personal history of colonic polyps: Secondary | ICD-10-CM | POA: Diagnosis not present

## 2017-06-11 DIAGNOSIS — Z1211 Encounter for screening for malignant neoplasm of colon: Secondary | ICD-10-CM | POA: Diagnosis not present

## 2017-06-11 DIAGNOSIS — K635 Polyp of colon: Secondary | ICD-10-CM | POA: Diagnosis not present

## 2017-06-12 DIAGNOSIS — D124 Benign neoplasm of descending colon: Secondary | ICD-10-CM | POA: Diagnosis not present

## 2017-06-12 DIAGNOSIS — D123 Benign neoplasm of transverse colon: Secondary | ICD-10-CM | POA: Diagnosis not present

## 2017-06-12 NOTE — Telephone Encounter (Signed)
Informed pt of below and she said she has an appointment with Dr. Nori Riis and will discuss with her then. Annette Rios, April D, [name]

## 2017-06-17 ENCOUNTER — Encounter: Payer: Self-pay | Admitting: Family Medicine

## 2017-06-17 ENCOUNTER — Ambulatory Visit (INDEPENDENT_AMBULATORY_CARE_PROVIDER_SITE_OTHER): Payer: Medicare Other | Admitting: Family Medicine

## 2017-06-17 VITALS — BP 138/86 | HR 59 | Temp 97.5°F | Ht 68.0 in | Wt 178.2 lb

## 2017-06-17 DIAGNOSIS — M545 Low back pain: Secondary | ICD-10-CM | POA: Diagnosis not present

## 2017-06-17 DIAGNOSIS — M48061 Spinal stenosis, lumbar region without neurogenic claudication: Secondary | ICD-10-CM | POA: Diagnosis not present

## 2017-06-17 DIAGNOSIS — Z86010 Personal history of colonic polyps: Secondary | ICD-10-CM

## 2017-06-17 DIAGNOSIS — R739 Hyperglycemia, unspecified: Secondary | ICD-10-CM

## 2017-06-17 DIAGNOSIS — I10 Essential (primary) hypertension: Secondary | ICD-10-CM | POA: Diagnosis not present

## 2017-06-17 DIAGNOSIS — G8929 Other chronic pain: Secondary | ICD-10-CM | POA: Diagnosis not present

## 2017-06-17 LAB — POCT GLYCOSYLATED HEMOGLOBIN (HGB A1C): HEMOGLOBIN A1C: 6.2

## 2017-06-17 MED ORDER — ATORVASTATIN CALCIUM 80 MG PO TABS: ORAL_TABLET | ORAL | 3 refills | Status: DC

## 2017-06-17 MED ORDER — ESOMEPRAZOLE MAGNESIUM 40 MG PO CPDR: 40.0000 mg | DELAYED_RELEASE_CAPSULE | Freq: Every day | ORAL | 3 refills | Status: DC | PRN

## 2017-06-17 MED ORDER — BENAZEPRIL-HYDROCHLOROTHIAZIDE 20-12.5 MG PO TABS: 1.0000 | ORAL_TABLET | Freq: Every day | ORAL | 3 refills | Status: DC

## 2017-06-17 MED ORDER — TRAMADOL HCL 50 MG PO TABS: ORAL_TABLET | ORAL | 5 refills | Status: DC

## 2017-06-17 NOTE — Patient Instructions (Addendum)
I have sput in a referral back to Dr. Vertell Limber. If you have not heard from his office in a week or so, please call Dr. Melven Sartorius office.  You have decided not to get flu shot today. Let me see you for a check up in February or March

## 2017-06-18 NOTE — Progress Notes (Signed)
    CHIEF COMPLAINT / HPI:   Follow-up hypertension, history of hyperglycemia, elevated cholesterol. Also has history of significant spinal stenosis and is having worsening back pain. Taking all her medicines appropriately without any side effects. Back pain 8-9 out of 10 most days. Particularly other some if she sits or stands for any length of time. Previously saw Dr. Vertell Limber at neurosurgery.  REVIEW OF SYSTEMS:  No bowel or bladder incontinence. No lower extremity weakness. She does have radicular pain at times into her legs. No dizziness. No chest pain. No shortness of breath  PERTINENT  PMH / PSH: I have reviewed the patient's medications, allergies, past medical and surgical history, smoking status and updated in the EMR as appropriate.   OBJECTIVE:     Vital signs reviewed. GENERAL: Well-developed, well-nourished, no acute distress. CARDIOVASCULAR: Regular rate and rhythm no murmur gallop or rub LUNGS: Clear to auscultation bilaterally, no rales or wheeze. ABDOMEN: Soft positive bowel sounds NEURO: No gross focal neurological deficits. MSK: Movement of extremity x 4.    ASSESSMENT / PLAN: Please see problem oriented charting for details

## 2017-06-18 NOTE — Assessment & Plan Note (Signed)
Her last MRI was 3 years ago. She's having worsening symptoms. I will refer her back to Dr. Vertell Limber for further evaluation and management. She potentially could benefit from something like an epidural steroid injection. She would really like to avoid surgery if possible but at this point she needs to do something as her pain is impacting her daily.

## 2017-06-18 NOTE — Assessment & Plan Note (Signed)
Well-controlled. I refilled her medications and we'll make no medication changes. The next office visit we will check her kidney function and electrolytes.

## 2017-06-18 NOTE — Assessment & Plan Note (Signed)
Reviewed her recent colonoscopy results which were 3 adenomatous polyp. She has follow-up colonoscopy in 3 years.

## 2017-06-18 NOTE — Assessment & Plan Note (Signed)
She's had consistent A1c's less than 7 so I don't think we need to continue following this.

## 2017-07-05 ENCOUNTER — Other Ambulatory Visit: Payer: Self-pay | Admitting: Family Medicine

## 2017-07-06 NOTE — Telephone Encounter (Signed)
Dear [name] Team Please call in her tramadol w 5 refills as below [facility]! Annette Rios

## 2017-07-06 NOTE — Telephone Encounter (Signed)
Called pharmacy, LVM for pt to call the office. Ottis Stain, CMA

## 2017-08-19 DIAGNOSIS — Z6826 Body mass index (BMI) 26.0-26.9, adult: Secondary | ICD-10-CM | POA: Diagnosis not present

## 2017-08-19 DIAGNOSIS — M5416 Radiculopathy, lumbar region: Secondary | ICD-10-CM | POA: Diagnosis not present

## 2017-08-19 DIAGNOSIS — M545 Low back pain: Secondary | ICD-10-CM | POA: Diagnosis not present

## 2017-08-19 DIAGNOSIS — R030 Elevated blood-pressure reading, without diagnosis of hypertension: Secondary | ICD-10-CM | POA: Diagnosis not present

## 2017-08-19 DIAGNOSIS — M5417 Radiculopathy, lumbosacral region: Secondary | ICD-10-CM | POA: Diagnosis not present

## 2017-08-26 ENCOUNTER — Other Ambulatory Visit: Payer: Self-pay | Admitting: Neurosurgery

## 2017-08-26 DIAGNOSIS — M5416 Radiculopathy, lumbar region: Secondary | ICD-10-CM

## 2017-09-06 ENCOUNTER — Ambulatory Visit
Admission: RE | Admit: 2017-09-06 | Discharge: 2017-09-06 | Disposition: A | Discharge status: Home / Self Care | Payer: Medicare Other | Source: Ambulatory Visit | Attending: Neurosurgery | Admitting: Neurosurgery

## 2017-09-06 DIAGNOSIS — M5416 Radiculopathy, lumbar region: Secondary | ICD-10-CM

## 2017-09-07 ENCOUNTER — Telehealth: Payer: Self-pay | Admitting: Family Medicine

## 2017-09-07 NOTE — Telephone Encounter (Signed)
Pt called and said she was supposed to get her URI yesterday but couldn't because she is claustrophobic. She needs another Rx for her medication for her claustrophobia so she can get a MRI. She would like the Rx left up front for her to pick up. Please let her know when its ready.

## 2017-09-08 NOTE — Telephone Encounter (Signed)
Dear [name] Team OK I am confused--=-what MRI are we talking about? I do not see one scheduled. Did the neurosurgeon schedule one? THANKS! Dorcas Mcmurray

## 2017-09-10 NOTE — Telephone Encounter (Signed)
Contacted pt and she said it was the neurosurgeon that scheduled it and I told her to contact them for the medication for claustrophobia.  Also pt scheduled an appointment to see Dr. Nori Riis. Routing to PCP as an Juluis Rainier.  Katharina Caper, April D, [name]

## 2017-09-23 ENCOUNTER — Other Ambulatory Visit: Payer: Self-pay | Admitting: Neurosurgery

## 2017-09-23 ENCOUNTER — Ambulatory Visit: Payer: Medicare Other | Admitting: Family Medicine

## 2017-09-23 DIAGNOSIS — M5416 Radiculopathy, lumbar region: Secondary | ICD-10-CM

## 2017-09-24 ENCOUNTER — Ambulatory Visit
Admission: RE | Admit: 2017-09-24 | Discharge: 2017-09-24 | Disposition: A | Discharge status: Home / Self Care | Payer: Medicare Other | Source: Ambulatory Visit | Attending: Neurosurgery | Admitting: Neurosurgery

## 2017-09-24 ENCOUNTER — Inpatient Hospital Stay
Admission: RE | Admit: 2017-09-24 | Discharge: 2017-09-24 | Disposition: A | Discharge status: Home / Self Care | Payer: Medicare Other | Source: Ambulatory Visit | Attending: Neurosurgery | Admitting: Neurosurgery

## 2017-09-24 DIAGNOSIS — M48061 Spinal stenosis, lumbar region without neurogenic claudication: Secondary | ICD-10-CM | POA: Diagnosis not present

## 2017-09-24 DIAGNOSIS — M5416 Radiculopathy, lumbar region: Secondary | ICD-10-CM

## 2017-09-30 ENCOUNTER — Ambulatory Visit (INDEPENDENT_AMBULATORY_CARE_PROVIDER_SITE_OTHER): Payer: Medicare Other | Admitting: Family Medicine

## 2017-09-30 ENCOUNTER — Encounter: Payer: Self-pay | Admitting: Family Medicine

## 2017-09-30 ENCOUNTER — Other Ambulatory Visit: Payer: Self-pay

## 2017-09-30 VITALS — BP 112/60 | HR 77 | Temp 98.8°F | Ht 68.0 in | Wt 180.8 lb

## 2017-09-30 DIAGNOSIS — Z0000 Encounter for general adult medical examination without abnormal findings: Secondary | ICD-10-CM

## 2017-09-30 DIAGNOSIS — R05 Cough: Secondary | ICD-10-CM

## 2017-09-30 DIAGNOSIS — I10 Essential (primary) hypertension: Secondary | ICD-10-CM

## 2017-09-30 MED ORDER — LOSARTAN POTASSIUM-HCTZ 50-12.5 MG PO TABS: 1.0000 | ORAL_TABLET | Freq: Every day | ORAL | 3 refills | Status: DC

## 2017-09-30 MED ORDER — BENZONATATE 200 MG PO CAPS: 200.0000 mg | ORAL_CAPSULE | Freq: Three times a day (TID) | ORAL | 1 refills | Status: DC | PRN

## 2017-09-30 NOTE — Patient Instructions (Signed)
I am changing her blood pressure pill because I think it is contributing to your recurrent dry cough.  Right now I think you also have a viral illness that is contributing to your cough as well so I am giving you a prescription for Tessalon pearls to use 1 every 8 hours as needed for cough.  They may make you just a little bit sleepy.  The cough should improve over the next couple of weeks and should definitely improve over the next 4-6 weeks.  I would like to see you back in 4-6 weeks so we can make sure this new blood pressure pill is working as well.  Regarding her health maintenance, your colonoscopy showed a benign polyp so you are due the next one in 1 year.  He had a hysterectomy so you do not need Pap smears.  All of your other health maintenance appears to be up-to-date.  Let me see you back in 4-6 weeks for follow-up of the medication change.  Please call in the interim with any new or worsening issues.  Happy holidays!

## 2017-10-01 DIAGNOSIS — Z0000 Encounter for general adult medical examination without abnormal findings: Secondary | ICD-10-CM | POA: Insufficient documentation

## 2017-10-01 NOTE — Assessment & Plan Note (Signed)
Updated lab work.  Colonoscopy is due 3 years.  She is status post hysterectomy so does not need a Pap smear.  Mammogram is in date.  Immunizations up-to-date.

## 2017-10-01 NOTE — Assessment & Plan Note (Signed)
After discussion about her cough today, I am concerned that she may have chronic low-grade nonproductive cough.  We will switch her from ACE inhibitor to ARB.  Would like her to follow-up 4-6 weeks after the switch so we can check her blood pressure control and recheck her kidney function and potassium.

## 2017-10-01 NOTE — Progress Notes (Signed)
    CHIEF COMPLAINT / HPI: #1.  Occasionally has left lower quadrant cramping.  Happens about once or twice a month.  Brief.  Last less than a minute.  Does not seem to be associated with bowel movement.  May be associated with excess flatulence.  Appetite's been fine.  Weights been stable.  Bowel movements have been typical in caliber and frequency, no blood seen in the stool.  She had a colonoscopy in the last couple of months and had one polyp removed.  She is due for repeat colonoscopy in 3 years. #2: Hypertension: Every year she gets some upper respiratory infection has cough that seems prolonged.  She has had 1 of those recently.  Is getting better but still aggravating.  She admits that she may have a little bit of a dry cough all of the time but just does not really think about it but certainly seems worse when she gets these viral illnesses.  REVIEW OF SYSTEMS: No shortness of breath.  No chest pain.  See HPI.  PERTINENT  PMH / PSH: I have reviewed the patient's medications, allergies, past medical and surgical history, smoking status and updated in the EMR as appropriate.   OBJECTIVE:  Vital signs reviewed. GENERAL: Well-developed, well-nourished, no acute distress. CARDIOVASCULAR: Regular rate and rhythm no murmur gallop or rub LUNGS: Clear to auscultation bilaterally, no rales or wheeze.  Normal respiratory effort ABDOMEN: Soft positive bowel sounds; no rebound or guarding.  No masses are noted. NEURO: No gross focal neurological deficits. MSK: Movement of extremity x 4. HEENT: Oropharynx is without any sign of exudate.  Neck is without any lymphadenopathy.  TMs bilaterally have mild retraction but otherwise are normal.  Nasal mucosa is normal.    ASSESSMENT / PLAN: #1.  Viral illness, upper respiratory symptoms.  We will give her some Tessalon Perles.   Please see problem oriented charting for details

## 2017-10-05 DIAGNOSIS — M48062 Spinal stenosis, lumbar region with neurogenic claudication: Secondary | ICD-10-CM | POA: Diagnosis not present

## 2017-10-05 DIAGNOSIS — Z6827 Body mass index (BMI) 27.0-27.9, adult: Secondary | ICD-10-CM | POA: Diagnosis not present

## 2017-10-05 DIAGNOSIS — M545 Low back pain: Secondary | ICD-10-CM | POA: Diagnosis not present

## 2017-10-05 DIAGNOSIS — M5416 Radiculopathy, lumbar region: Secondary | ICD-10-CM | POA: Diagnosis not present

## 2017-10-05 DIAGNOSIS — R030 Elevated blood-pressure reading, without diagnosis of hypertension: Secondary | ICD-10-CM | POA: Diagnosis not present

## 2017-10-09 ENCOUNTER — Other Ambulatory Visit: Payer: Self-pay | Admitting: Neurosurgery

## 2017-11-02 ENCOUNTER — Other Ambulatory Visit: Payer: Self-pay

## 2017-11-02 ENCOUNTER — Encounter: Payer: Self-pay | Admitting: Family Medicine

## 2017-11-02 ENCOUNTER — Ambulatory Visit (INDEPENDENT_AMBULATORY_CARE_PROVIDER_SITE_OTHER): Payer: Medicare Other | Admitting: Family Medicine

## 2017-11-02 DIAGNOSIS — I10 Essential (primary) hypertension: Secondary | ICD-10-CM

## 2017-11-02 MED ORDER — BENAZEPRIL-HYDROCHLOROTHIAZIDE 20-12.5 MG PO TABS: 1.0000 | ORAL_TABLET | Freq: Every day | ORAL | 3 refills | Status: DC

## 2017-11-02 NOTE — Assessment & Plan Note (Signed)
She is going to continue on the ace HCTZ combination.  She is well controlled.  Follow-up 6 months, sooner if problems.

## 2017-11-02 NOTE — Progress Notes (Signed)
    CHIEF COMPLAINT / HPI: Follow-up attempted change of blood pressure regimen secondary to cough.  She picked up the new medicine but then read the side effects and decided not to use it so she is started on her original blood pressure medicine.  Her cough is significantly improved. 2.  She has scheduled back surgery middle of next months.  REVIEW OF SYSTEMS: Cough improved.  No fever.  No weight loss.  PERTINENT  PMH / PSH: I have reviewed the patient's medications, allergies, past medical and surgical history, smoking status and updated in the EMR as appropriate.   OBJECTIVE:  Vital signs reviewed. GENERAL: Well-developed, well-nourished, no acute distress. CARDIOVASCULAR: Regular rate and rhythm no murmur gallop or rub LUNGS: Clear to auscultation bilaterally, no rales or wheeze. ABDOMEN: Soft positive bowel sounds NEURO: No gross focal neurological deficits. MSK: Movement of extremity x 4.    ASSESSMENT / PLAN: Please see problem oriented charting for details

## 2017-11-04 ENCOUNTER — Ambulatory Visit: Payer: Medicare Other | Admitting: Family Medicine

## 2017-11-07 NOTE — H&P (Signed)
Patient ID:   [id number] Patient: Lenoria Narine  Date of Birth: September 25, 1956 Visit Type: Office Visit   Date: 10/05/2017 12:30 PM Provider: Marchia Meiers. Vertell Limber MD   This 62 year old female presents for back pain.   History of Present Illness: 1.  back pain  Patient returns to review her MRI. She reports continued left leg pain. She denies pain in the right leg.  MRI 09/24/17: Mildly progressed degenerative spondylosis and facet arthrosis as compared to most recent MRI from 2015. Left subarticular disc protrusions at L4-5 and L5-S1 impinging upon the descending L5 and S1 nerve roots. Disc bulging with facet hypertrophy at L2-3 and L3-4 with resultant moderate spinal stenosis. Moderate bilateral L5 foraminal narrowing related to degenerative disc osteophyte and facet disease, left greater than right.           MEDICATIONS(added, continued or stopped this visit): Started Medication Directions Instruction Stopped   aspirin take 1 tablet by oral route  every day     BENAZEPRIL/HYDROCHLOROTHIAZIDE take 1 tablet by oral route  every day     cyclobenzaprine 10 mg tablet take 1 tablet by oral route 3 times every day     fish oil Omega 3  ORAL      tramadol 50 mg tablet take 1 tablet by oral route  every 6 hours as needed     vitamin d  ORAL     09/10/2017 Xanax 1 mg tablet take 1 tablet by oral route 30 minutes prior to MRI       ALLERGIES: Ingredient Reaction Medication Name Comment  NO KNOWN ALLERGIES     No known allergies.    Vitals Date Temp F BP Pulse Ht In Wt Lb BMI BSA Pain Score  10/05/2017  137/63 78 68 178 27.06  8/10      IMPRESSION Patient returns to review MRI. MRI shows disc protrusions at left L4-5 and L5-S1 resulting in stenosis. Scoliosis and disc degeneration noted throughout the lumbar spine. On confrontational testing, full strength in lower extremities. Schedule L4-5 L5-S1 PLIF.   Completed Orders (this encounter) Order Details Reason Side  Interpretation Result Initial Treatment Date Region  Scoliosis- AP/Lat      10/05/2017    Assessment/Plan # Detail Type Description   1. Assessment Spinal stenosis, lumbar region (M48.06).           Schedule L4-5 L5-S1 PLIF. Nurse education given. Fit for LSO brace.  Orders: Diagnostic Procedures: Assessment Procedure  M48.06 Scoliosis- AP/Lat             Provider:  Vertell Limber MD, Marchia Meiers 10/05/2017 1:53 PM  Dictation edited by: Lucita Lora    CC Providers: Dorcas Mcmurray [address] [place],  [place]  [address]-   Edwin [place] [address] [place] [address]-              Electronically signed by Marchia Meiers Vertell Limber MD on 10/06/2017 05:21 PM  Patient ID:   [id number] Patient: Aarion Kittrell  Date of Birth: 07-Jan-1956 Visit Type: Office Visit   Date: 08/19/2017 01:30 PM Provider: Marchia Meiers. Vertell Limber MD   This 62 year old female presents for back pain.   History of Present Illness: 1.  back pain  Patient returns reporting lumbar and left leg pain increasing over the past few months. She notes pain radiates to the ankle. She grades the pain 9/10. She recalls no injury. She reports right leg pain has resolved.  Aleve or tramadol taken  1-2 per day Diclofenac gel applied topically daily  X-ray on [place]           MEDICATIONS(added, continued or stopped this visit): Started Medication Directions Instruction Stopped   aspirin take 1 tablet by oral route  every day     BENAZEPRIL/HYDROCHLOROTHIAZIDE take 1 tablet by oral route  every day     cyclobenzaprine 10 mg tablet take 1 tablet by oral route 3 times every day     fish oil Omega 3  ORAL      Percocet 5 mg-325 mg tablet take 1 tablet by oral route 2 times every day as needed  08/19/2017   tramadol 50 mg tablet take 1 tablet by oral route  every 6 hours as needed     vitamin d  ORAL        ALLERGIES: Ingredient Reaction Medication Name Comment  NO KNOWN ALLERGIES      No known allergies. Reviewed, no changes.    Vitals Date Temp F BP Pulse Ht In Wt Lb BMI BSA Pain Score  08/19/2017  148/77 64 68 176.4 26.82  9/10      IMPRESSION Patient presents with lumbar and left leg pain. X-ray shows scoliosis. On confrontational testing, left hip abductor and left EHL weakness, mildly positive left seated SLR, and left sciatic notch discomfort. Schedule lumbar MRI for further evaluation.  Completed Orders (this encounter) Order Details Reason Side Interpretation Result Initial Treatment Date Region  Lumbar Spine- AP/Lat/Flex/Ex      08/19/2017 All Levels to All Levels   Assessment/Plan # Detail Type Description   1. Assessment Radiculopathy, lumbar region (M54.16).           Pain Management Plan Pain Scale: 9/10. Method: Numeric Pain Intensity Scale. Location: back. Onset: 04/25/2017. Duration: varies. Quality: discomforting. Pain management follow-up plan of care: Patient taking medication as prescribed..  Schedule lumbar MRI. Follow-up after MRI.  Orders: Diagnostic Procedures: Assessment Procedure  M54.16 Lumbar Spine- AP/Lat/Flex/Ex             Provider:  Vertell Limber MD, Marchia Meiers 08/19/2017 3:23 PM  Dictation edited by: Lucita Lora    CC Providers: Dorcas Mcmurray [address] [place],  [place]  [address]-   Edwin Avbuere  [facility] [address], [place] [address]-              Electronically signed by Marchia Meiers. Vertell Limber MD on 08/23/2017 03:36 PM

## 2017-11-12 NOTE — Pre-Procedure Instructions (Signed)
Annette Rios  11/12/2017      [facility] [address] [place], [place] [address] Phone: [phone] Fax: [phone]    Your procedure is scheduled on Friday, Feb. 15th   Report to [facility] Admitting at 5:30AM             (posted surgery time 7:30a - 11:23a)   Call this number if you have problems the morning of surgery:  [phone]   Remember:              4-5 days prior to surgery, STOP TAKING any Vitamins, Herbal Supplements,  Anti-inflammatories.   Do not eat food or drink liquids after midnight, Thursday.   Take these medicines the morning of surgery with A SIP OF WATER : Nexium, Tramadol   Do not wear jewelry, make-up or nail polish.  Do not wear lotions, powders, perfumes, or deodorant.  Do not shave 48 hours prior to surgery.    Do not bring valuables to the hospital.  [facility] is not responsible for any belongings or valuables.  Contacts, dentures or bridgework may not be worn into surgery.  Leave your suitcase in the car.  After surgery it may be brought to your room.  For patients admitted to the hospital, discharge time will be determined by your treatment team.  Please read over the following fact sheets that you were given. Pain Booklet, MRSA Information and Surgical Site Infection Prevention      [facility]- Preparing For Surgery  Before surgery, you can play an important role. Because skin is not sterile, your skin needs to be as free of germs as possible. You can reduce the number of germs on your skin by washing with CHG (chlorahexidine gluconate) Soap before surgery.  CHG is an antiseptic cleaner which kills germs and bonds with the skin to continue killing germs even after washing.  Please do not use if you have an allergy to CHG or antibacterial soaps. If your skin becomes reddened/irritated stop using the CHG.  Do not  shave (including legs and underarms) for at least 48 hours prior to first CHG shower. It is OK to shave your face.  Please follow these instructions carefully.   1. Shower the NIGHT BEFORE SURGERY and the MORNING OF SURGERY with CHG.   2. If you chose to wash your hair, wash your hair first as usual with your normal shampoo.  3. After you shampoo, rinse your hair and body thoroughly to remove the shampoo.  4. Use CHG as you would any other liquid soap. You can apply CHG directly to the skin and wash gently with a scrungie or a clean washcloth.   5. Apply the CHG Soap to your body ONLY FROM THE NECK DOWN.  Do not use on open wounds or open sores. Avoid contact with your eyes, ears, mouth and genitals (private parts). Wash Face and genitals (private parts)  with your normal soap.  6. Wash thoroughly, paying special attention to the area where your surgery will be performed.  7. Thoroughly rinse your body with warm water from the neck down.  8. DO NOT shower/wash with your normal soap after using and rinsing off the CHG Soap.  9. Pat yourself dry with a CLEAN TOWEL.  10. Wear CLEAN PAJAMAS to bed the night before surgery, wear comfortable clothes the morning of surgery  11. Place CLEAN SHEETS on your bed the night of your first shower and DO NOT SLEEP WITH PETS.    Day of Surgery: Do not apply any deodorants/lotions. Please wear clean clothes to the hospital/surgery center.

## 2017-11-13 ENCOUNTER — Encounter (HOSPITAL_COMMUNITY): Payer: Self-pay

## 2017-11-13 ENCOUNTER — Other Ambulatory Visit: Payer: Self-pay

## 2017-11-13 ENCOUNTER — Encounter (HOSPITAL_COMMUNITY)
Admission: RE | Admit: 2017-11-13 | Discharge: 2017-11-13 | Disposition: A | Discharge status: Home / Self Care | Payer: Medicare Other | Source: Ambulatory Visit | Attending: Neurosurgery | Admitting: Neurosurgery

## 2017-11-13 DIAGNOSIS — Z01812 Encounter for preprocedural laboratory examination: Secondary | ICD-10-CM | POA: Diagnosis not present

## 2017-11-13 DIAGNOSIS — E7800 Pure hypercholesterolemia, unspecified: Secondary | ICD-10-CM | POA: Diagnosis not present

## 2017-11-13 DIAGNOSIS — I10 Essential (primary) hypertension: Secondary | ICD-10-CM | POA: Insufficient documentation

## 2017-11-13 HISTORY — DX: Unspecified cataract: H26.9

## 2017-11-13 HISTORY — DX: Prediabetes: R73.03

## 2017-11-13 LAB — BASIC METABOLIC PANEL
ANION GAP: 11 (ref 5–15)
BUN: 13 mg/dL (ref 6–20)
CALCIUM: 9.3 mg/dL (ref 8.9–10.3)
CO2: 23 mmol/L (ref 22–32)
Chloride: 106 mmol/L (ref 101–111)
Creatinine, Ser: 0.64 mg/dL (ref 0.44–1.00)
GFR calc Af Amer: >60 mL/min (ref >60)
GLUCOSE: 125 mg/dL — AB (ref 65–99)
Potassium: 4 mmol/L (ref 3.5–5.1)
Sodium: 140 mmol/L (ref 135–145)

## 2017-11-13 LAB — CBC
HCT: 41.3 % (ref 36.0–46.0)
Hemoglobin: 13.3 g/dL (ref 12.0–15.0)
MCH: 31.5 pg (ref 26.0–34.0)
MCHC: 32.2 g/dL (ref 30.0–36.0)
MCV: 97.9 fL (ref 78.0–100.0)
PLATELETS: 231 10*3/uL (ref 150–400)
RBC: 4.22 MIL/uL (ref 3.87–5.11)
RDW: 14.8 % (ref 11.5–15.5)
WBC: 5.6 10*3/uL (ref 4.0–10.5)

## 2017-11-13 LAB — HEMOGLOBIN A1C
Hgb A1c MFr Bld: 6.9 % — ABNORMAL HIGH (ref 4.8–5.6)
MEAN PLASMA GLUCOSE: 151.33 mg/dL

## 2017-11-13 LAB — TYPE AND SCREEN
ABO/RH(D): O POS
Antibody Screen: NEGATIVE

## 2017-11-13 LAB — SURGICAL PCR SCREEN
MRSA, PCR: NEGATIVE
STAPHYLOCOCCUS AUREUS: NEGATIVE

## 2017-11-13 NOTE — Progress Notes (Signed)
PCP is Dr. Dorcas Mcmurray @ [facility] LOV 11/2017 Patient does state she was told she had murmur, but according to Dr. Verlon Au Office note, "no murmur, gallop or rub" Denies any chest pains, sob Denies any cardiac testing or going to see cardio.

## 2017-11-13 NOTE — Progress Notes (Signed)
Anesthesia PAT Evaluation: Patient is a 62 year old female scheduled for L4-5, L5-S1 PLIF 11/20/17 by Dr. Erline Levine.  History includes never smoker, HTN, hypercholesterolemia, GERD, borderline DM, headaches, claustrophobia, arthritis, murmur, hysterectomy, right hemicolectomy (tubulovillous adenoma) 05/26/07.  PCP is Dr. Dorcas Mcmurray. She is aware of surgery plans.  Meds include ASA 81 mg (last dose 11/15/17), Lipitor, benazepril-HCTZ, Flexeril, Nexium, fish oil, tramadol.  BP 130/68   Pulse 100   Temp 37 C   Resp 20   Ht 5\' 8"  (1.727 m)   Wt 185 lb 6.4 oz (84.1 kg)   SpO2 100%   BMI 28.19 kg/m  Patient denied chest pain, SOB, syncope. She gets occasional trace ankle edema, but stable. She can typically walk up to a mile a day--sometimes less if back and LLE are hurting. She denied prior echo and cath, but thinks she may have had a normal ETT many years ago. She denied known personal or family history of anesthesia complications. Exam shows heart RRR, no murmur heard. Lungs clear.   EKG 11/13/17: SR with PACs, nono-specific T wave abnormality.   Sleep Study 11/18/15: IMPRESSIONS - No significant obstructive sleep apnea occurred during this study (AHI = 3.4/h). - No significant central sleep apnea occurred during this study (CAI = 0.2/h). - Mild oxygen desaturation was noted during this study (Min O2 = 84.00%). - The patient snored with Soft snoring volume. - No cardiac abnormalities were noted during this study. - Severe periodic limb movements of sleep occurred during the study. No significant associated arousals.  Preoperative labs noted. Cr 0.64. Glucose 125. A1c 6.9. CBC WNL. T&S in process. A1c result forwarded to Dr. Nori Riis.  Patient with good exercise tolerance and without CV symptoms. I did not appreciate a murmur on exam. A1c < 7. If no acute changes then I anticipate that she can proceed as planned.  George Hugh [facility]/Anesthesiology Phone [phone] 11/13/2017 10:01 AM

## 2017-11-20 ENCOUNTER — Inpatient Hospital Stay (HOSPITAL_COMMUNITY): Payer: Medicare Other

## 2017-11-20 ENCOUNTER — Inpatient Hospital Stay (HOSPITAL_COMMUNITY): Payer: Medicare Other | Admitting: Certified Registered Nurse Anesthetist

## 2017-11-20 ENCOUNTER — Inpatient Hospital Stay (HOSPITAL_COMMUNITY): Payer: Medicare Other | Admitting: Vascular Surgery

## 2017-11-20 ENCOUNTER — Inpatient Hospital Stay (HOSPITAL_COMMUNITY)
Admission: RE | Admit: 2017-11-20 | Discharge: 2017-11-23 | DRG: 455 | Disposition: A | Discharge status: Home Health | Payer: Medicare Other | Source: Ambulatory Visit | Attending: Neurosurgery | Admitting: Neurosurgery

## 2017-11-20 ENCOUNTER — Other Ambulatory Visit: Payer: Self-pay

## 2017-11-20 ENCOUNTER — Encounter (HOSPITAL_COMMUNITY): Payer: Self-pay | Admitting: *Deleted

## 2017-11-20 ENCOUNTER — Inpatient Hospital Stay (HOSPITAL_COMMUNITY)
Admission: RE | Disposition: A | Discharge status: Home Health | Payer: Self-pay | Source: Ambulatory Visit | Attending: Neurosurgery

## 2017-11-20 DIAGNOSIS — M4186 Other forms of scoliosis, lumbar region: Secondary | ICD-10-CM | POA: Diagnosis present

## 2017-11-20 DIAGNOSIS — Z79899 Other long term (current) drug therapy: Secondary | ICD-10-CM | POA: Diagnosis not present

## 2017-11-20 DIAGNOSIS — M48062 Spinal stenosis, lumbar region with neurogenic claudication: Secondary | ICD-10-CM | POA: Diagnosis present

## 2017-11-20 DIAGNOSIS — Z7982 Long term (current) use of aspirin: Secondary | ICD-10-CM | POA: Diagnosis not present

## 2017-11-20 DIAGNOSIS — M48061 Spinal stenosis, lumbar region without neurogenic claudication: Secondary | ICD-10-CM

## 2017-11-20 DIAGNOSIS — M4316 Spondylolisthesis, lumbar region: Secondary | ICD-10-CM | POA: Diagnosis present

## 2017-11-20 DIAGNOSIS — M1990 Unspecified osteoarthritis, unspecified site: Secondary | ICD-10-CM | POA: Diagnosis present

## 2017-11-20 DIAGNOSIS — Z880 Allergy status to penicillin: Secondary | ICD-10-CM | POA: Diagnosis not present

## 2017-11-20 DIAGNOSIS — Z79891 Long term (current) use of opiate analgesic: Secondary | ICD-10-CM

## 2017-11-20 DIAGNOSIS — Z981 Arthrodesis status: Secondary | ICD-10-CM | POA: Diagnosis not present

## 2017-11-20 DIAGNOSIS — K219 Gastro-esophageal reflux disease without esophagitis: Secondary | ICD-10-CM | POA: Diagnosis present

## 2017-11-20 DIAGNOSIS — M419 Scoliosis, unspecified: Secondary | ICD-10-CM | POA: Diagnosis present

## 2017-11-20 DIAGNOSIS — I10 Essential (primary) hypertension: Secondary | ICD-10-CM | POA: Diagnosis present

## 2017-11-20 DIAGNOSIS — M5116 Intervertebral disc disorders with radiculopathy, lumbar region: Secondary | ICD-10-CM | POA: Diagnosis present

## 2017-11-20 HISTORY — PX: LUMBAR FUSION: SHX111

## 2017-11-20 SURGERY — POSTERIOR LUMBAR FUSION 2 LEVEL
Anesthesia: General | Site: Back

## 2017-11-20 MED ORDER — OXYCODONE HCL 5 MG/5ML PO SOLN: 5.0000 mg | Freq: Once | ORAL | Status: DC | PRN

## 2017-11-20 MED ORDER — FENTANYL CITRATE (PF) 250 MCG/5ML IJ SOLN
INTRAMUSCULAR | Status: AC
  Filled 2017-11-20: qty 5

## 2017-11-20 MED ORDER — ZOLPIDEM TARTRATE 5 MG PO TABS: 5.0000 mg | ORAL_TABLET | Freq: Every evening | ORAL | Status: DC | PRN

## 2017-11-20 MED ORDER — HYDROMORPHONE HCL 1 MG/ML IJ SOLN
0.2500 mg | INTRAMUSCULAR | Status: DC | PRN
  Administered 2017-11-20 (×2): 0.5 mg via INTRAVENOUS

## 2017-11-20 MED ORDER — METHOCARBAMOL 1000 MG/10ML IJ SOLN: 500.0000 mg | Freq: Four times a day (QID) | INTRAVENOUS | Status: DC | PRN

## 2017-11-20 MED ORDER — PROPOFOL 10 MG/ML IV BOLUS
INTRAVENOUS | Status: DC | PRN
  Administered 2017-11-20: 150 mg via INTRAVENOUS

## 2017-11-20 MED ORDER — OXYCODONE HCL 5 MG PO TABS: 5.0000 mg | ORAL_TABLET | Freq: Once | ORAL | Status: DC | PRN

## 2017-11-20 MED ORDER — METHOCARBAMOL 500 MG PO TABS
500.0000 mg | ORAL_TABLET | Freq: Four times a day (QID) | ORAL | Status: DC | PRN
  Administered 2017-11-20 – 2017-11-22 (×5): 500 mg via ORAL
  Filled 2017-11-20 (×5): qty 1

## 2017-11-20 MED ORDER — OMEGA-3-ACID ETHYL ESTERS 1 G PO CAPS
1.0000 g | ORAL_CAPSULE | Freq: Every day | ORAL | Status: DC
  Administered 2017-11-21 – 2017-11-23 (×3): 1 g via ORAL
  Filled 2017-11-20 (×3): qty 1

## 2017-11-20 MED ORDER — PANTOPRAZOLE SODIUM 40 MG PO TBEC
40.0000 mg | DELAYED_RELEASE_TABLET | Freq: Every day | ORAL | Status: DC
  Administered 2017-11-21 – 2017-11-23 (×3): 40 mg via ORAL
  Filled 2017-11-20 (×3): qty 1

## 2017-11-20 MED ORDER — ALUM & MAG HYDROXIDE-SIMETH 200-200-20 MG/5ML PO SUSP: 30.0000 mL | Freq: Four times a day (QID) | ORAL | Status: DC | PRN

## 2017-11-20 MED ORDER — VANCOMYCIN HCL IN DEXTROSE 1-5 GM/200ML-% IV SOLN
1000.0000 mg | INTRAVENOUS | Status: AC
  Administered 2017-11-20: 1000 mg via INTRAVENOUS

## 2017-11-20 MED ORDER — ASPIRIN EC 81 MG PO TBEC
81.0000 mg | DELAYED_RELEASE_TABLET | Freq: Every day | ORAL | Status: DC
  Administered 2017-11-21 – 2017-11-23 (×3): 81 mg via ORAL
  Filled 2017-11-20 (×3): qty 1

## 2017-11-20 MED ORDER — SODIUM CHLORIDE 0.9 % IV SOLN: 250.0000 mL | INTRAVENOUS | Status: DC

## 2017-11-20 MED ORDER — LIDOCAINE 2% (20 MG/ML) 5 ML SYRINGE
INTRAMUSCULAR | Status: DC | PRN
  Administered 2017-11-20: 80 mg via INTRAVENOUS

## 2017-11-20 MED ORDER — ONDANSETRON HCL 4 MG/2ML IJ SOLN
INTRAMUSCULAR | Status: DC | PRN
  Administered 2017-11-20: 4 mg via INTRAVENOUS

## 2017-11-20 MED ORDER — ACETAMINOPHEN 650 MG RE SUPP: 650.0000 mg | RECTAL | Status: DC | PRN

## 2017-11-20 MED ORDER — SODIUM CHLORIDE 0.9% FLUSH: 3.0000 mL | Freq: Two times a day (BID) | INTRAVENOUS | Status: DC

## 2017-11-20 MED ORDER — MIDAZOLAM HCL 5 MG/5ML IJ SOLN
INTRAMUSCULAR | Status: DC | PRN
  Administered 2017-11-20: 2 mg via INTRAVENOUS

## 2017-11-20 MED ORDER — FENTANYL CITRATE (PF) 100 MCG/2ML IJ SOLN
INTRAMUSCULAR | Status: DC | PRN
  Administered 2017-11-20: 50 ug via INTRAVENOUS
  Administered 2017-11-20: 100 ug via INTRAVENOUS
  Administered 2017-11-20 (×2): 50 ug via INTRAVENOUS

## 2017-11-20 MED ORDER — ONDANSETRON HCL 4 MG/2ML IJ SOLN
INTRAMUSCULAR | Status: AC
  Filled 2017-11-20: qty 2

## 2017-11-20 MED ORDER — BENAZEPRIL-HYDROCHLOROTHIAZIDE 20-12.5 MG PO TABS: 1.0000 | ORAL_TABLET | Freq: Every day | ORAL | Status: DC

## 2017-11-20 MED ORDER — FLEET ENEMA 7-19 GM/118ML RE ENEM
1.0000 | ENEMA | Freq: Once | RECTAL | Status: DC | PRN
  Filled 2017-11-20: qty 1

## 2017-11-20 MED ORDER — SUGAMMADEX SODIUM 200 MG/2ML IV SOLN
INTRAVENOUS | Status: DC | PRN
  Administered 2017-11-20: 200 mg via INTRAVENOUS

## 2017-11-20 MED ORDER — OXYCODONE HCL 5 MG PO TABS
5.0000 mg | ORAL_TABLET | ORAL | Status: DC | PRN
  Administered 2017-11-20 – 2017-11-21 (×3): 5 mg via ORAL
  Filled 2017-11-20 (×3): qty 1

## 2017-11-20 MED ORDER — MIDAZOLAM HCL 2 MG/2ML IJ SOLN
INTRAMUSCULAR | Status: AC
  Filled 2017-11-20: qty 2

## 2017-11-20 MED ORDER — PHENOL 1.4 % MT LIQD: 1.0000 | OROMUCOSAL | Status: DC | PRN

## 2017-11-20 MED ORDER — ALBUMIN HUMAN 5 % IV SOLN
INTRAVENOUS | Status: DC | PRN
  Administered 2017-11-20: 10:00:00 via INTRAVENOUS

## 2017-11-20 MED ORDER — BUPIVACAINE HCL (PF) 0.5 % IJ SOLN
INTRAMUSCULAR | Status: DC | PRN
  Administered 2017-11-20: 5 mL

## 2017-11-20 MED ORDER — SURGIFOAM 100 EX MISC
CUTANEOUS | Status: DC | PRN
  Administered 2017-11-20: 08:00:00 via TOPICAL

## 2017-11-20 MED ORDER — ONDANSETRON HCL 4 MG/2ML IJ SOLN: 4.0000 mg | Freq: Four times a day (QID) | INTRAMUSCULAR | Status: DC | PRN

## 2017-11-20 MED ORDER — HYDROCHLOROTHIAZIDE 12.5 MG PO CAPS
12.5000 mg | ORAL_CAPSULE | Freq: Every day | ORAL | Status: DC
Start: 2017-11-21 — End: 2017-11-23
  Administered 2017-11-21 – 2017-11-23 (×3): 12.5 mg via ORAL
  Filled 2017-11-20 (×3): qty 1

## 2017-11-20 MED ORDER — LIDOCAINE-EPINEPHRINE 1 %-1:100000 IJ SOLN
INTRAMUSCULAR | Status: DC | PRN
  Administered 2017-11-20: 5 mL

## 2017-11-20 MED ORDER — DEXTROSE 5 % IV SOLN
INTRAVENOUS | Status: DC | PRN
  Administered 2017-11-20: 25 ug/min via INTRAVENOUS

## 2017-11-20 MED ORDER — CYCLOBENZAPRINE HCL 10 MG PO TABS: 10.0000 mg | ORAL_TABLET | Freq: Every day | ORAL | Status: DC | PRN

## 2017-11-20 MED ORDER — THROMBIN 20000 UNITS EX SOLR
CUTANEOUS | Status: AC
  Filled 2017-11-20: qty 20000

## 2017-11-20 MED ORDER — LIDOCAINE 2% (20 MG/ML) 5 ML SYRINGE
INTRAMUSCULAR | Status: AC
  Filled 2017-11-20: qty 5

## 2017-11-20 MED ORDER — PHENYLEPHRINE 40 MCG/ML (10ML) SYRINGE FOR IV PUSH (FOR BLOOD PRESSURE SUPPORT)
PREFILLED_SYRINGE | INTRAVENOUS | Status: DC | PRN
  Administered 2017-11-20 (×2): 80 ug via INTRAVENOUS
  Administered 2017-11-20: 40 ug via INTRAVENOUS

## 2017-11-20 MED ORDER — TRAMADOL HCL 50 MG PO TABS
50.0000 mg | ORAL_TABLET | Freq: Four times a day (QID) | ORAL | Status: DC | PRN
  Administered 2017-11-20 – 2017-11-23 (×5): 50 mg via ORAL
  Filled 2017-11-20 (×6): qty 1

## 2017-11-20 MED ORDER — DEXAMETHASONE SODIUM PHOSPHATE 10 MG/ML IJ SOLN
INTRAMUSCULAR | Status: AC
  Filled 2017-11-20: qty 1

## 2017-11-20 MED ORDER — DOCUSATE SODIUM 100 MG PO CAPS
100.0000 mg | ORAL_CAPSULE | Freq: Two times a day (BID) | ORAL | Status: DC
  Administered 2017-11-20 – 2017-11-23 (×7): 100 mg via ORAL
  Filled 2017-11-20 (×7): qty 1

## 2017-11-20 MED ORDER — BISACODYL 10 MG RE SUPP
10.0000 mg | Freq: Every day | RECTAL | Status: DC | PRN
  Administered 2017-11-23: 10 mg via RECTAL
  Filled 2017-11-20: qty 1

## 2017-11-20 MED ORDER — MENTHOL 3 MG MT LOZG: 1.0000 | LOZENGE | OROMUCOSAL | Status: DC | PRN

## 2017-11-20 MED ORDER — MORPHINE SULFATE (PF) 4 MG/ML IV SOLN: 2.0000 mg | INTRAVENOUS | Status: DC | PRN

## 2017-11-20 MED ORDER — LACTATED RINGERS IV SOLN
INTRAVENOUS | Status: DC | PRN
  Administered 2017-11-20 (×2): via INTRAVENOUS

## 2017-11-20 MED ORDER — 0.9 % SODIUM CHLORIDE (POUR BTL) OPTIME
TOPICAL | Status: DC | PRN
  Administered 2017-11-20: 1000 mL

## 2017-11-20 MED ORDER — EPHEDRINE 5 MG/ML INJ
INTRAVENOUS | Status: AC
  Filled 2017-11-20: qty 10

## 2017-11-20 MED ORDER — PANTOPRAZOLE SODIUM 40 MG IV SOLR
40.0000 mg | Freq: Every day | INTRAVENOUS | Status: DC
  Administered 2017-11-20: 40 mg via INTRAVENOUS
  Filled 2017-11-20: qty 40

## 2017-11-20 MED ORDER — CHLORHEXIDINE GLUCONATE CLOTH 2 % EX PADS: 6.0000 | MEDICATED_PAD | Freq: Once | CUTANEOUS | Status: DC

## 2017-11-20 MED ORDER — SODIUM CHLORIDE 0.9% FLUSH: 3.0000 mL | INTRAVENOUS | Status: DC | PRN

## 2017-11-20 MED ORDER — BUPIVACAINE LIPOSOME 1.3 % IJ SUSP
20.0000 mL | INTRAMUSCULAR | Status: AC
  Administered 2017-11-20: 20 mL
  Filled 2017-11-20: qty 20

## 2017-11-20 MED ORDER — ATORVASTATIN CALCIUM 80 MG PO TABS
80.0000 mg | ORAL_TABLET | Freq: Every day | ORAL | Status: DC
  Administered 2017-11-21 – 2017-11-22 (×2): 80 mg via ORAL
  Filled 2017-11-20 (×2): qty 1

## 2017-11-20 MED ORDER — ROCURONIUM BROMIDE 10 MG/ML (PF) SYRINGE
PREFILLED_SYRINGE | INTRAVENOUS | Status: DC | PRN
  Administered 2017-11-20: 30 mg via INTRAVENOUS
  Administered 2017-11-20: 20 mg via INTRAVENOUS
  Administered 2017-11-20: 50 mg via INTRAVENOUS

## 2017-11-20 MED ORDER — BUPIVACAINE HCL (PF) 0.5 % IJ SOLN
INTRAMUSCULAR | Status: AC
  Filled 2017-11-20: qty 30

## 2017-11-20 MED ORDER — HYDROMORPHONE HCL 1 MG/ML IJ SOLN
INTRAMUSCULAR | Status: AC
  Filled 2017-11-20: qty 1

## 2017-11-20 MED ORDER — POLYETHYLENE GLYCOL 3350 17 G PO PACK
17.0000 g | PACK | Freq: Every day | ORAL | Status: DC | PRN
  Administered 2017-11-22 – 2017-11-23 (×2): 17 g via ORAL
  Filled 2017-11-20 (×2): qty 1

## 2017-11-20 MED ORDER — EPHEDRINE SULFATE-NACL 50-0.9 MG/10ML-% IV SOSY
PREFILLED_SYRINGE | INTRAVENOUS | Status: DC | PRN
  Administered 2017-11-20: 10 mg via INTRAVENOUS

## 2017-11-20 MED ORDER — LIDOCAINE-EPINEPHRINE 1 %-1:100000 IJ SOLN
INTRAMUSCULAR | Status: AC
  Filled 2017-11-20: qty 1

## 2017-11-20 MED ORDER — PHENYLEPHRINE 40 MCG/ML (10ML) SYRINGE FOR IV PUSH (FOR BLOOD PRESSURE SUPPORT)
PREFILLED_SYRINGE | INTRAVENOUS | Status: AC
  Filled 2017-11-20: qty 10

## 2017-11-20 MED ORDER — SUGAMMADEX SODIUM 200 MG/2ML IV SOLN
INTRAVENOUS | Status: AC
  Filled 2017-11-20: qty 2

## 2017-11-20 MED ORDER — BENAZEPRIL HCL 20 MG PO TABS
20.0000 mg | ORAL_TABLET | Freq: Every day | ORAL | Status: DC
Start: 2017-11-21 — End: 2017-11-23
  Administered 2017-11-21 – 2017-11-23 (×3): 20 mg via ORAL
  Filled 2017-11-20 (×3): qty 1

## 2017-11-20 MED ORDER — LACTATED RINGERS IV SOLN
Freq: Once | INTRAVENOUS | Status: AC
  Administered 2017-11-20: 06:00:00 via INTRAVENOUS

## 2017-11-20 MED ORDER — ACETAMINOPHEN 325 MG PO TABS
650.0000 mg | ORAL_TABLET | ORAL | Status: DC | PRN
  Administered 2017-11-20: 650 mg via ORAL
  Filled 2017-11-20: qty 2

## 2017-11-20 MED ORDER — PROPOFOL 10 MG/ML IV BOLUS
INTRAVENOUS | Status: AC
  Filled 2017-11-20: qty 40

## 2017-11-20 MED ORDER — HYDROCODONE-ACETAMINOPHEN 5-325 MG PO TABS
2.0000 | ORAL_TABLET | ORAL | Status: DC | PRN
  Administered 2017-11-20 – 2017-11-22 (×3): 2 via ORAL
  Filled 2017-11-20 (×3): qty 2

## 2017-11-20 MED ORDER — VANCOMYCIN HCL IN DEXTROSE 1-5 GM/200ML-% IV SOLN
INTRAVENOUS | Status: AC
  Filled 2017-11-20: qty 200

## 2017-11-20 MED ORDER — DEXAMETHASONE SODIUM PHOSPHATE 10 MG/ML IJ SOLN
INTRAMUSCULAR | Status: DC | PRN
  Administered 2017-11-20: 5 mg via INTRAVENOUS

## 2017-11-20 MED ORDER — ROCURONIUM BROMIDE 10 MG/ML (PF) SYRINGE
PREFILLED_SYRINGE | INTRAVENOUS | Status: AC
  Filled 2017-11-20: qty 5

## 2017-11-20 MED ORDER — KCL IN DEXTROSE-NACL 20-5-0.45 MEQ/L-%-% IV SOLN
INTRAVENOUS | Status: DC
  Administered 2017-11-20: 13:00:00 via INTRAVENOUS
  Filled 2017-11-20: qty 1000

## 2017-11-20 MED ORDER — ONDANSETRON HCL 4 MG PO TABS: 4.0000 mg | ORAL_TABLET | Freq: Four times a day (QID) | ORAL | Status: DC | PRN

## 2017-11-20 MED ORDER — VANCOMYCIN HCL IN DEXTROSE 1-5 GM/200ML-% IV SOLN
1000.0000 mg | Freq: Once | INTRAVENOUS | Status: DC
  Filled 2017-11-20: qty 200

## 2017-11-20 MED FILL — Sodium Chloride IV Soln 0.9%: INTRAVENOUS | Qty: 1000 | Status: AC

## 2017-11-20 MED FILL — Heparin Sodium (Porcine) Inj 1000 Unit/ML: INTRAMUSCULAR | Qty: 30 | Status: AC

## 2017-11-20 SURGICAL SUPPLY — 73 items
ADH SKN CLS APL DERMABOND .7 (GAUZE/BANDAGES/DRESSINGS) ×1
BASKET BONE COLLECTION (BASKET) ×3 IMPLANT
BLADE CLIPPER SURG (BLADE) IMPLANT
BONE CANC CHIPS 20CC PCAN1/4 (Bone Implant) ×3 IMPLANT
BUR MATCHSTICK NEURO 3.0 LAGG (BURR) ×3 IMPLANT
BUR PRECISION FLUTE 5.0 (BURR) ×3 IMPLANT
CAGE COROENT LG 10X9X23-12 (Cage) ×6 IMPLANT
CAGE COROENT MP 8X9X23M-8 SPIN (Cage) ×6 IMPLANT
CANISTER SUCT 3000ML PPV (MISCELLANEOUS) ×3 IMPLANT
CARTRIDGE OIL MAESTRO DRILL (MISCELLANEOUS) ×1 IMPLANT
CHIPS CANC BONE 20CC PCAN1/4 (Bone Implant) ×1 IMPLANT
CONT SPEC 4OZ CLIKSEAL STRL BL (MISCELLANEOUS) ×3 IMPLANT
COVER BACK TABLE 60X90IN (DRAPES) ×3 IMPLANT
DECANTER SPIKE VIAL GLASS SM (MISCELLANEOUS) ×3 IMPLANT
DERMABOND ADVANCED (GAUZE/BANDAGES/DRESSINGS) ×2
DERMABOND ADVANCED .7 DNX12 (GAUZE/BANDAGES/DRESSINGS) ×1 IMPLANT
DIFFUSER DRILL AIR PNEUMATIC (MISCELLANEOUS) ×3 IMPLANT
DRAPE C-ARM 42X72 X-RAY (DRAPES) ×3 IMPLANT
DRAPE C-ARMOR (DRAPES) ×3 IMPLANT
DRAPE LAPAROTOMY 100X72X124 (DRAPES) ×3 IMPLANT
DRAPE POUCH INSTRU U-SHP 10X18 (DRAPES) ×3 IMPLANT
DRAPE SURG 17X23 STRL (DRAPES) ×3 IMPLANT
DRSG OPSITE POSTOP 4X6 (GAUZE/BANDAGES/DRESSINGS) ×3 IMPLANT
DURAPREP 26ML APPLICATOR (WOUND CARE) ×3 IMPLANT
ELECT REM PT RETURN 9FT ADLT (ELECTROSURGICAL) ×3
ELECTRODE REM PT RTRN 9FT ADLT (ELECTROSURGICAL) ×1 IMPLANT
EVACUATOR 1/8 PVC DRAIN (DRAIN) IMPLANT
GAUZE SPONGE 4X4 12PLY STRL (GAUZE/BANDAGES/DRESSINGS) ×3 IMPLANT
GAUZE SPONGE 4X4 16PLY XRAY LF (GAUZE/BANDAGES/DRESSINGS) IMPLANT
GLOVE BIO SURGEON STRL SZ8 (GLOVE) ×6 IMPLANT
GLOVE BIOGEL PI IND STRL 8 (GLOVE) ×2 IMPLANT
GLOVE BIOGEL PI IND STRL 8.5 (GLOVE) ×2 IMPLANT
GLOVE BIOGEL PI INDICATOR 8 (GLOVE) ×4
GLOVE BIOGEL PI INDICATOR 8.5 (GLOVE) ×4
GLOVE ECLIPSE 8.0 STRL XLNG CF (GLOVE) ×6 IMPLANT
GLOVE EXAM NITRILE LRG STRL (GLOVE) IMPLANT
GLOVE EXAM NITRILE XL STR (GLOVE) IMPLANT
GLOVE EXAM NITRILE XS STR PU (GLOVE) IMPLANT
GOWN STRL REUS W/ TWL LRG LVL3 (GOWN DISPOSABLE) IMPLANT
GOWN STRL REUS W/ TWL XL LVL3 (GOWN DISPOSABLE) ×3 IMPLANT
GOWN STRL REUS W/TWL 2XL LVL3 (GOWN DISPOSABLE) IMPLANT
GOWN STRL REUS W/TWL LRG LVL3 (GOWN DISPOSABLE)
GOWN STRL REUS W/TWL XL LVL3 (GOWN DISPOSABLE) ×9
KIT BASIN OR (CUSTOM PROCEDURE TRAY) ×3 IMPLANT
KIT POSITION SURG JACKSON T1 (MISCELLANEOUS) ×3 IMPLANT
KIT ROOM TURNOVER OR (KITS) ×3 IMPLANT
MILL MEDIUM DISP (BLADE) IMPLANT
NEEDLE HYPO 25X1 1.5 SAFETY (NEEDLE) ×3 IMPLANT
NEEDLE SPNL 18GX3.5 QUINCKE PK (NEEDLE) IMPLANT
NS IRRIG 1000ML POUR BTL (IV SOLUTION) ×3 IMPLANT
OIL CARTRIDGE MAESTRO DRILL (MISCELLANEOUS) ×3
PACK LAMINECTOMY NEURO (CUSTOM PROCEDURE TRAY) ×3 IMPLANT
PAD ARMBOARD 7.5X6 YLW CONV (MISCELLANEOUS) ×9 IMPLANT
PATTIES SURGICAL .5 X.5 (GAUZE/BANDAGES/DRESSINGS) IMPLANT
PATTIES SURGICAL .5 X1 (DISPOSABLE) IMPLANT
PATTIES SURGICAL 1X1 (DISPOSABLE) IMPLANT
ROD RELIN-O LORD 5.5X65MM (Rod) ×6 IMPLANT
SCREW LOCK RELINE 5.5 TULIP (Screw) ×18 IMPLANT
SCREW RELINE-O POLY 6.5X40 (Screw) ×12 IMPLANT
SCREW RELINE-O POLY 7.5X40 (Screw) ×6 IMPLANT
SPONGE LAP 4X18 X RAY DECT (DISPOSABLE) IMPLANT
SPONGE SURGIFOAM ABS GEL 100 (HEMOSTASIS) ×3 IMPLANT
STAPLER SKIN PROX WIDE 3.9 (STAPLE) IMPLANT
SUT VIC AB 1 CT1 18XBRD ANBCTR (SUTURE) ×2 IMPLANT
SUT VIC AB 1 CT1 8-18 (SUTURE) ×6
SUT VIC AB 2-0 CT1 18 (SUTURE) ×6 IMPLANT
SUT VIC AB 3-0 SH 8-18 (SUTURE) ×6 IMPLANT
SYR 5ML LL (SYRINGE) IMPLANT
SYRINGE 20CC LL (MISCELLANEOUS) ×3 IMPLANT
TOWEL GREEN STERILE (TOWEL DISPOSABLE) ×3 IMPLANT
TOWEL GREEN STERILE FF (TOWEL DISPOSABLE) ×3 IMPLANT
TRAY FOLEY W/METER SILVER 16FR (SET/KITS/TRAYS/PACK) ×3 IMPLANT
WATER STERILE IRR 1000ML POUR (IV SOLUTION) ×3 IMPLANT

## 2017-11-20 NOTE — Brief Op Note (Signed)
11/20/2017  11:30 AM  PATIENT:  Annette Rios  62 y.o. female  PRE-OPERATIVE DIAGNOSIS:  Spinal stenosis of lumbar region with neurogenic claudication, lumbar scoliosis, spondylolisthesis, herniated lumbar disc, radiculopathy, lumbago  POST-OPERATIVE DIAGNOSIS:  Spinal stenosis of lumbar region with neurogenic claudication, lumbar scoliosis, spondylolisthesis, herniated lumbar disc, radiculopathy, lumbago   PROCEDURE:  Procedure(s) with comments: L4-5 L5-S1 Posterior lumbar interbody fusion (N/A) - L4-5 L5-S1 Posterior lumbar interbody fusion with segmental instrumentation, posterolateral arthrodesis with autograft and allograft  SURGEON:  Surgeon(s) and Role:    Erline Levine, MD - Primary    Kristeen Miss, MD - Assisting  PHYSICIAN ASSISTANT:   ASSISTANTS: P[place], RN   ANESTHESIA:   general  EBL:  250 mL   BLOOD ADMINISTERED:none  DRAINS: none   LOCAL MEDICATIONS USED:  MARCAINE    and LIDOCAINE   SPECIMEN:  No Specimen  DISPOSITION OF SPECIMEN:  N/A  COUNTS:  YES  TOURNIQUET:  * No tourniquets in log *  DICTATION: DICTATION: Patient is 62 year old woman with  HNP, spondylosis, scoliosis, spondylolisthesis, stenosis, DDD, radiculopathy L4/5, L5/S1. She has a severe left leg pain and weakness. It was elected to take her to surgery for decompression and fusion at L4/5 and L5/S1 levels.    Procedure: Patient was placed in a prone position on the Nichols table after smooth and uncomplicated induction of general endotracheal anesthesia. Her low back was prepped and draped in usual sterile fashion with betadine scrub and DuraPrep. Area of incision was infiltrated with local lidocaine. Incision was made to the lumbodorsal fascia was incised and exposure was performed of the L4-S1 spinous processes laminae facet joint and transverse processes. Intraoperative x-ray was obtained which confirmed correct orientation. A total laminectomy of L4 and L5 was performed with  disarticulation of the facet joints at this level and thorough decompression was performed of both L4, L5 and S1 nerve roots along with the common dural tube. There was densely adherent spondylytic material compressing the thecal sac and both L4 and L5 nerve roots.  A thorough discectomy and preparation of the endplates was performed at both the L4/5 and L5/S1 levels.  The interspaces were packed with  PEEK cages (8 x 9 x 23 x 8 degree at L5/S1 and 21mm x 9 x 23 x 12 degree at L4/5) . Bone autograft was packed within the interspace bilaterally: 8 cc of autograft was placed in the L5/S1 interspace and 10 cc at L 45 level. A large osteophytic rifge was removed at the L 5 S 1  Level on the left.  After a thorough decompression with bilateral discectomy at L4/5, bilateral 69mm mm peek cages were packed with BMP and extender and was inserted the interspace and countersunk appropriately. The posterolateral region was extensively decorticated and pedicle probes were placed at L4, L5 and S1 bilaterally. Intraoperative fluoroscopy confirmed correct orientationin the AP and lateral plane. 40 x 7.5 mm pedicle screws were placed at S1 bilaterally and 40 x 6.5 mm screws placed at L5 bilaterally and 40x 6.52mm screws were placed at L4 bilaterally. The right L4 screw was redirected medially with better positioning within the L4 pedicle. Final x-rays demonstrated well-positioned interbody grafts and pedicle screw fixation.  65 mm lordotic rods were placed and locked down in situ and the posterolateral region was packed with the remaining autograft mixed with allograft, 20 cc per side. Incision was infiltrated 20 cc of long-acting Marcaine was used in the posterolateral soft tissue. Fascia was closed with 1  Vicryl sutures skin edges were reapproximated 2 and 3-0 Vicryl sutures. The wound is dressed with Dermabond and an occlusive dressing.  the patient was extubated in the operating room and taken to recovery in stable satisfactory  condition she tolerated traction well counts were correct at the end of the case.   PLAN OF CARE: Admit to inpatient   PATIENT DISPOSITION:  PACU - hemodynamically stable.   Delay start of Pharmacological VTE agent (>24hrs) due to surgical blood loss or risk of bleeding: yes

## 2017-11-20 NOTE — Anesthesia Procedure Notes (Addendum)
Procedure Name: Intubation Date/Time: 11/20/2017 7:39 AM Performed by: Colin Benton, CRNA Pre-anesthesia Checklist: Patient identified, Emergency Drugs available, Suction available and Patient being monitored Patient Re-evaluated:Patient Re-evaluated prior to induction Oxygen Delivery Method: Circle system utilized Preoxygenation: Pre-oxygenation with 100% oxygen Induction Type: IV induction Ventilation: Mask ventilation without difficulty Laryngoscope Size: Miller and 2 Grade View: Grade I Tube type: Oral Tube size: 7.0 mm Number of attempts: 1 Airway Equipment and Method: Stylet Placement Confirmation: ETT inserted through vocal cords under direct vision,  positive ETCO2 and breath sounds checked- equal and bilateral Secured at: 23 cm Tube secured with: Tape Dental Injury: Teeth and Oropharynx as per pre-operative assessment

## 2017-11-20 NOTE — Transfer of Care (Signed)
Immediate Anesthesia Transfer of Care Note  Patient: Annette Rios  Procedure(s) Performed: L4-5 L5-S1 Posterior lumbar interbody fusion (N/A Back)  Patient Location: PACU  Anesthesia Type:General  Level of Consciousness: drowsy, patient cooperative and responds to stimulation  Airway & Oxygen Therapy: Patient Spontanous Breathing and Patient connected to nasal cannula oxygen  Post-op Assessment: Report given to RN and Post -op Vital signs reviewed and stable  Post vital signs: Reviewed and stable  Last Vitals:  Vitals:   11/20/17 0638  BP: 130/74  Pulse: 75  Resp: 20  Temp: 36.8 C  SpO2: 99%    Last Pain:  Vitals:   11/20/17 0638  TempSrc: Oral  PainSc:       Patients Stated Pain Goal: 3 (57/47/34 0370)  Complications: No apparent anesthesia complications

## 2017-11-20 NOTE — Interval H&P Note (Signed)
History and Physical Interval Note:  11/20/2017 7:09 AM  Annette Rios  has presented today for surgery, with the diagnosis of Spinal stenosis of lumbar region with neurogenic claudication  The various methods of treatment have been discussed with the patient and family. After consideration of risks, benefits and other options for treatment, the patient has consented to  Procedure(s) with comments: L4-5 L5-S1 Posterior lumbar interbody fusion (N/A) - L4-5 L5-S1 Posterior lumbar interbody fusion as a surgical intervention .  The patient's history has been reviewed, patient examined, no change in status, stable for surgery.  I have reviewed the patient's chart and labs.  Questions were answered to the patient's satisfaction.     Cornelia Walraven D

## 2017-11-20 NOTE — Care Management Note (Signed)
Case Management Note  Patient Details  Name: AMYLYNN FANO MRN: [id number] Date of Birth: 20-Jan-1956  Subjective/Objective:    62 y.o. female admitted on 11/20/17 for elective L4-S1 PLIF.  PTA, pt independent, lives at home with adult children.                  Action/Plan: [facility] to follow at home for PT/OT, if needed, as arranged prior to admission.  Will follow.    Expected Discharge Date:                  Expected Discharge Plan:  [place]  In-House Referral:     Discharge planning Services  CM Consult  Post Acute Care Choice:    Choice offered to:     DME Arranged:    DME Agency:     HH Arranged:  PT, OT HH Agency:  [facility]  Status of Service:  In process, will continue to follow  If discussed at Long Length of Stay Meetings, dates discussed:    Additional Comments:  Reinaldo Raddle, RN, BSN  Trauma/Neuro ICU Case Manager [phone]

## 2017-11-20 NOTE — Progress Notes (Signed)
Pharmacy Progress Note:  55 YOF s/p Lumbar fusion. Pharmacy consulted to dose post op vancomycin. Patient has no drains in place. Will order vancomycin 1 gm IV x 1 dose tonight and sign off.   Albertina Parr, PharmD., BCPS Clinical Pharmacist Pager [phone]

## 2017-11-20 NOTE — Evaluation (Signed)
Physical Therapy Evaluation Patient Details Name: Annette Rios MRN: [id number] DOB: [birth year] Today's Date: 11/20/2017   History of Present Illness  62 y.o. female admitted on 11/20/17 for elective L4-S1 PLIF.  Pt with significant PMH of pre diabetes, HTN, claustrophobia, and bil carpal tunnel release.    Clinical Impression  Pt is POD #0 and was able to mobilize EOB and stand with min hand held assist, stepping in place.  VSS and family was in room, so I left her sitting EOB for a few minutes visiting with family with brace donned.  I anticipate she will progress nicely and has good family support.   PT to follow acutely for deficits listed below.       Follow Up Recommendations No PT follow up;Supervision for mobility/OOB    Equipment Recommendations  None recommended by PT    Recommendations for Other Services   NA    Precautions / Restrictions Precautions Precautions: Back Precaution Booklet Issued: No Precaution Comments: verbally reviewed no BLT Required Braces or Orthoses: Spinal Brace Spinal Brace: Applied in sitting position      Mobility  Bed Mobility Overal bed mobility: Needs Assistance Bed Mobility: Rolling;Sidelying to Sit Rolling: Min assist Sidelying to sit: Min assist       General bed mobility comments: Min assist for transitional movements, min assist to support trunk to get to sitting EOB.  Verbal cues for log roll technique.   Transfers Overall transfer level: Needs assistance Equipment used: 1 person hand held assist Transfers: Sit to/from Stand Sit to Stand: Min assist         General transfer comment: Min hand held assist to slowly come to standing.  Verbal cues for fully upright posture.   Ambulation/Gait Ambulation/Gait assistance: Min assist Ambulation Distance (Feet): 5 Feet Assistive device: 1 person hand held assist Gait Pattern/deviations: Step-to pattern     General Gait Details: Pt able to march in place and take a few  steps towards the [facility].      Balance Overall balance assessment: Needs assistance Sitting-balance support: Feet supported;No upper extremity supported Sitting balance-Leahy Scale: Good     Standing balance support: Single extremity supported Standing balance-Leahy Scale: Fair                               Pertinent Vitals/Pain Pain Assessment: 0-10 Pain Score: 10-Worst pain ever Pain Location: incisional, none in left leg so far Pain Descriptors / Indicators: Burning;Sharp Pain Intervention(s): Limited activity within patient's tolerance;Monitored during session;Patient requesting pain meds-RN notified;RN gave pain meds during session    Home Living Family/patient expects to be discharged to:: Private residence Living Arrangements: Children Available Help at Discharge: Family;Available 24 hours/day Type of Home: Apartment Home Access: Stairs to enter Entrance Stairs-Rails: Right Entrance Stairs-Number of Steps: 2 sets of 7 steps Home Layout: One level Home Equipment: None      Prior Function Level of Independence: Independent         Comments: Pt drives, does not work, on disability.      Hand Dominance   Dominant Hand: Right    Extremity/Trunk Assessment   Upper Extremity Assessment Upper Extremity Assessment: Defer to OT evaluation    Lower Extremity Assessment Lower Extremity Assessment: Generalized weakness(at least 3/5 per bed level and seated gross assessment)    Cervical / Trunk Assessment Cervical / Trunk Assessment: Other exceptions Cervical / Trunk Exceptions: h/o chronic back pain, this is her first  surgery.   Communication   Communication: No difficulties  Cognition Arousal/Alertness: Awake/alert Behavior During Therapy: WFL for tasks assessed/performed Overall Cognitive Status: Within Functional Limits for tasks assessed                                        General Comments      Exercises      Assessment/Plan    PT Assessment Patient needs continued PT services  PT Problem List Decreased activity tolerance;Decreased balance;Decreased mobility;Decreased cognition;Decreased knowledge of use of DME;Decreased safety awareness;Decreased knowledge of precautions;Pain       PT Treatment Interventions DME instruction;Gait training;Stair training;Functional mobility training;Therapeutic exercise;Therapeutic activities;Balance training;Patient/family education;Neuromuscular re-education    PT Goals (Current goals can be found in the Care Plan section)  Acute Rehab PT Goals Patient Stated Goal: to go home PT Goal Formulation: With patient Time For Goal Achievement: 12/04/17 Potential to Achieve Goals: Good    Frequency Min 5X/week   Barriers to discharge        Co-evaluation               AM-PAC PT "6 Clicks" Daily Activity  Outcome Measure Difficulty turning over in bed (including adjusting bedclothes, sheets and blankets)?: Unable Difficulty moving from lying on back to sitting on the side of the bed? : Unable Difficulty sitting down on and standing up from a chair with arms (e.g., wheelchair, bedside commode, etc,.)?: Unable Help needed moving to and from a bed to chair (including a wheelchair)?: A Little Help needed walking in hospital room?: A Little Help needed climbing 3-5 steps with a railing? : A Little 6 Click Score: 12    End of Session Equipment Utilized During Treatment: Back brace Activity Tolerance: Patient limited by pain Patient left: in bed;Other (comment);with family/visitor present(seated EOB) Nurse Communication: Mobility status PT Visit Diagnosis: Difficulty in walking, not elsewhere classified (R26.2);Pain;Other symptoms and signs involving the nervous system (R29.898) Pain - Right/Left: (lower) Pain - part of body: (back)    Time: 7124-5809 PT Time Calculation (min) (ACUTE ONLY): 28 min   Charges:        Wells Guiles B. Karee [place], PT,  DPT [phone]   PT Evaluation $PT Eval Moderate Complexity: 1 Mod PT Treatments $Therapeutic Activity: 8-22 mins   11/20/2017, 3:56 PM

## 2017-11-20 NOTE — Progress Notes (Addendum)
Patient ID: Annette Rios, female   DOB: November 09, 1955, 62 y.o.   MRN: [id number] Alert, conversant, smiling while sitting on edge of bed. Family present. Pt reports no left leg pain, only incisional soreness. Good strength BLE. LSO in use.   Patient is doing well.

## 2017-11-20 NOTE — Anesthesia Preprocedure Evaluation (Addendum)
Anesthesia Evaluation  Patient identified by MRN, date of birth, ID band Patient awake    Reviewed: Allergy & Precautions, NPO status , Patient's Chart, lab work & pertinent test results  Airway Mallampati: I  TM Distance: >3 FB Neck ROM: Full    Dental  (+) Edentulous Upper, Edentulous Lower   Pulmonary neg pulmonary ROS,    breath sounds clear to auscultation       Cardiovascular hypertension,  Rhythm:Regular Rate:Normal     Neuro/Psych negative neurological ROS     GI/Hepatic Neg liver ROS, GERD  ,  Endo/Other  negative endocrine ROS  Renal/GU negative Renal ROS     Musculoskeletal  (+) Arthritis ,   Abdominal   Peds  Hematology   Anesthesia Other Findings   Reproductive/Obstetrics                            Anesthesia Physical Anesthesia Plan  ASA: II  Anesthesia Plan: General   Post-op Pain Management:    Induction: Intravenous  PONV Risk Score and Plan: 4 or greater and Treatment may vary due to age or medical condition  Airway Management Planned: Oral ETT  Additional Equipment:   Intra-op Plan:   Post-operative Plan: Extubation in OR  Informed Consent: I have reviewed the patients History and Physical, chart, labs and discussed the procedure including the risks, benefits and alternatives for the proposed anesthesia with the patient or authorized representative who has indicated his/her understanding and acceptance.   Dental advisory given  Plan Discussed with: CRNA  Anesthesia Plan Comments:         Anesthesia Quick Evaluation

## 2017-11-20 NOTE — Op Note (Signed)
11/20/2017  11:30 AM  PATIENT:  Annette Rios  62 y.o. female  PRE-OPERATIVE DIAGNOSIS:  Spinal stenosis of lumbar region with neurogenic claudication, lumbar scoliosis, spondylolisthesis, herniated lumbar disc, radiculopathy, lumbago  POST-OPERATIVE DIAGNOSIS:  Spinal stenosis of lumbar region with neurogenic claudication, lumbar scoliosis, spondylolisthesis, herniated lumbar disc, radiculopathy, lumbago   PROCEDURE:  Procedure(s) with comments: L4-5 L5-S1 Posterior lumbar interbody fusion (N/A) - L4-5 L5-S1 Posterior lumbar interbody fusion with segmental instrumentation, posterolateral arthrodesis with autograft and allograft  SURGEON:  Surgeon(s) and Role:    Erline Levine, MD - Primary    Kristeen Miss, MD - Assisting  PHYSICIAN ASSISTANT:   ASSISTANTS: [place], RN   ANESTHESIA:   general  EBL:  250 mL   BLOOD ADMINISTERED:none  DRAINS: none   LOCAL MEDICATIONS USED:  MARCAINE    and LIDOCAINE   SPECIMEN:  No Specimen  DISPOSITION OF SPECIMEN:  N/A  COUNTS:  YES  TOURNIQUET:  * No tourniquets in log *  DICTATION: DICTATION: Patient is 62 year old woman with  HNP, spondylosis, scoliosis, spondylolisthesis, stenosis, DDD, radiculopathy L4/5, L5/S1. She has a severe left leg pain and weakness. It was elected to take her to surgery for decompression and fusion at L4/5 and L5/S1 levels.    Procedure: Patient was placed in a prone position on the Bassett table after smooth and uncomplicated induction of general endotracheal anesthesia. Her low back was prepped and draped in usual sterile fashion with betadine scrub and DuraPrep. Area of incision was infiltrated with local lidocaine. Incision was made to the lumbodorsal fascia was incised and exposure was performed of the L4-S1 spinous processes laminae facet joint and transverse processes. Intraoperative x-ray was obtained which confirmed correct orientation. A total laminectomy of L4 and L5 was performed with  disarticulation of the facet joints at this level and thorough decompression was performed of both L4, L5 and S1 nerve roots along with the common dural tube. There was densely adherent spondylytic material compressing the thecal sac and both L4 and L5 nerve roots.  A thorough discectomy and preparation of the endplates was performed at both the L4/5 and L5/S1 levels.  The interspaces were packed with  PEEK cages (8 x 9 x 23 x 8 degree at L5/S1 and 50mm x 9 x 23 x 12 degree at L4/5) . Bone autograft was packed within the interspace bilaterally: 8 cc of autograft was placed in the L5/S1 interspace and 10 cc at L 45 level. A large osteophytic rifge was removed at the L 5 S 1  Level on the left.  After a thorough decompression with bilateral discectomy at L4/5, bilateral 9mm mm peek cages were packed with BMP and extender and was inserted the interspace and countersunk appropriately. The posterolateral region was extensively decorticated and pedicle probes were placed at L4, L5 and S1 bilaterally. Intraoperative fluoroscopy confirmed correct orientationin the AP and lateral plane. 40 x 7.5 mm pedicle screws were placed at S1 bilaterally and 40 x 6.5 mm screws placed at L5 bilaterally and 40x 6.58mm screws were placed at L4 bilaterally. The right L4 screw was redirected medially with better positioning within the L4 pedicle. Final x-rays demonstrated well-positioned interbody grafts and pedicle screw fixation.  65 mm lordotic rods were placed and locked down in situ and the posterolateral region was packed with the remaining autograft mixed with allograft, 20 cc per side. Incision was infiltrated 20 cc of long-acting Marcaine was used in the posterolateral soft tissue. Fascia was closed with 1  Vicryl sutures skin edges were reapproximated 2 and 3-0 Vicryl sutures. The wound is dressed with Dermabond and an occlusive dressing.  the patient was extubated in the operating room and taken to recovery in stable satisfactory  condition she tolerated traction well counts were correct at the end of the case.   PLAN OF CARE: Admit to inpatient   PATIENT DISPOSITION:  PACU - hemodynamically stable.   Delay start of Pharmacological VTE agent (>24hrs) due to surgical blood loss or risk of bleeding: yes

## 2017-11-20 NOTE — Anesthesia Postprocedure Evaluation (Signed)
Anesthesia Post Note  Patient: Annette Rios  Procedure(s) Performed: L4-5 L5-S1 Posterior lumbar interbody fusion (N/A Back)     Patient location during evaluation: PACU Anesthesia Type: General Level of consciousness: awake, sedated and oriented Pain management: pain level controlled Vital Signs Assessment: post-procedure vital signs reviewed and stable Respiratory status: spontaneous breathing, nonlabored ventilation, respiratory function stable and patient connected to nasal cannula oxygen Cardiovascular status: blood pressure returned to baseline and stable Postop Assessment: no apparent nausea or vomiting Anesthetic complications: no    Last Vitals:  Vitals:   11/20/17 1236 11/20/17 1321  BP: (!) 102/58 121/68  Pulse: 84 83  Resp: 13 12  Temp:  36.7 C  SpO2: 92% 92%    Last Pain:  Vitals:   11/20/17 1437  TempSrc:   PainSc: 9                  [name],JAMES TERRILL

## 2017-11-21 NOTE — Progress Notes (Signed)
Subjective: Patient reports doing well  Objective: Vital signs in last 24 hours: Temp:  [97.5 F (36.4 C)-98.8 F (37.1 C)] 98.5 F (36.9 C) (02/16 0338) Pulse Rate:  [70-96] 87 (02/16 0338) Resp:  [10-27] 16 (02/16 0338) BP: (102-138)/(50-89) 115/54 (02/16 0338) SpO2:  [92 %-100 %] 96 % (02/16 0338) Weight:  [83.5 kg (184 lb 1.4 oz)] 83.5 kg (184 lb 1.4 oz) (02/15 1321)  Intake/Output from previous day: 02/15 0701 - 02/16 0700 In: 1885 [P.O.:240; I.V.:1395; IV Piggyback:250] Out: 2695 [ZPHXT:0569; Blood:250] Intake/Output this shift: Total I/O In: -  Out: 1600 [Urine:1600]  Physical Exam: Strength full.  Dressing CDI.  Minimal pain.  Lab Results: No results for input(s): WBC, HGB, HCT, PLT in the last 72 hours. BMET No results for input(s): NA, K, CL, CO2, GLUCOSE, BUN, CREATININE, CALCIUM in the last 72 hours.  Studies/Results: Dg Lumbar Spine 2-3 Views  Result Date: 11/20/2017 CLINICAL DATA:  Localization.  L4-S1 PLIF. EXAM: LUMBAR SPINE - 2-3 VIEW; DG C-ARM 61-120 MIN COMPARISON:  Lumbar MRI 09/24/2017 FINDINGS: Spinal numbering as on preoperative MRI. There is changes of L4-5 and L5-S1 PLIF. Unremarkable positioning of hardware. No visible intraoperative fracture. IMPRESSION: Fluoroscopy for L4-5 and L5-S1 PLIF. Electronically Signed   By: Monte Fantasia M.D.   On: 11/20/2017 11:39   Dg C-arm 1-60 Min  Result Date: 11/20/2017 CLINICAL DATA:  Localization.  L4-S1 PLIF. EXAM: LUMBAR SPINE - 2-3 VIEW; DG C-ARM 61-120 MIN COMPARISON:  Lumbar MRI 09/24/2017 FINDINGS: Spinal numbering as on preoperative MRI. There is changes of L4-5 and L5-S1 PLIF. Unremarkable positioning of hardware. No visible intraoperative fracture. IMPRESSION: Fluoroscopy for L4-5 and L5-S1 PLIF. Electronically Signed   By: Monte Fantasia M.D.   On: 11/20/2017 11:39   Dg C-arm 1-60 Min  Result Date: 11/20/2017 CLINICAL DATA:  Localization.  L4-S1 PLIF. EXAM: LUMBAR SPINE - 2-3 VIEW; DG C-ARM 61-120  MIN COMPARISON:  Lumbar MRI 09/24/2017 FINDINGS: Spinal numbering as on preoperative MRI. There is changes of L4-5 and L5-S1 PLIF. Unremarkable positioning of hardware. No visible intraoperative fracture. IMPRESSION: Fluoroscopy for L4-5 and L5-S1 PLIF. Electronically Signed   By: Monte Fantasia M.D.   On: 11/20/2017 11:39    Assessment/Plan: Patient is doing well.  Mobilize with PT today.      LOS: 1 day    Peggyann Shoals, MD 11/21/2017, 6:57 AM

## 2017-11-21 NOTE — Progress Notes (Signed)
Physical Therapy Treatment Patient Details Name: Annette Rios MRN: [id number] DOB: 05-Aug-1956 Today's Date: 11/21/2017    History of Present Illness 62 y.o. female admitted on 11/20/17 for elective L4-S1 PLIF.  Pt with significant PMH of pre diabetes, HTN, claustrophobia, and bil carpal tunnel release.      PT Comments    Pt progressing well with ability to ambulate in hall, perform stairs and perform transfers without physical assist. Pt educated for all precautions with handout provided and pt with brace on in chair on arrival. Pt verbalized 2/3 precautions beginning and end of session despite education for all. Will continue to follow.     Follow Up Recommendations  No PT follow up;Supervision for mobility/OOB     Equipment Recommendations  None recommended by PT    Recommendations for Other Services       Precautions / Restrictions Precautions Precautions: Back Precaution Booklet Issued: Yes (comment) Precaution Comments: educated on back precautions Required Braces or Orthoses: Spinal Brace Spinal Brace: Applied in sitting position Restrictions Weight Bearing Restrictions: No    Mobility  Bed Mobility Overal bed mobility: Needs Assistance Bed Mobility: Rolling;Sidelying to Sit Rolling: Supervision Sidelying to sit: Supervision       General bed mobility comments: in chair on arrival  Transfers Overall transfer level: Needs assistance   Transfers: Sit to/from Stand Sit to Stand: Supervision         General transfer comment: cues for scooting sequence and hand position  Ambulation/Gait Ambulation/Gait assistance: Supervision Ambulation Distance (Feet): 550 Feet Assistive device: None Gait Pattern/deviations: Step-through pattern;Decreased stride length   Gait velocity interpretation: Below normal speed for age/gender General Gait Details: pt with steady gait without use of DME and good posture, cues for direction   Stairs Stairs: Yes   Stair  Management: Alternating pattern;Forwards;One rail Right Number of Stairs: 11    Wheelchair Mobility    Modified Rankin (Stroke Patients Only)       Balance                                            Cognition Arousal/Alertness: Awake/alert Behavior During Therapy: WFL for tasks assessed/performed Overall Cognitive Status: Within Functional Limits for tasks assessed                                        Exercises      General Comments        Pertinent Vitals/Pain Pain Assessment: 0-10 Pain Score: 4  Pain Location: incision Pain Descriptors / Indicators: Aching;Sore Pain Intervention(s): Limited activity within patient's tolerance;Repositioned    Home Living Family/patient expects to be discharged to:: Private residence Living Arrangements: Children Available Help at Discharge: Family;Available 24 hours/day Type of Home: Apartment Home Access: Stairs to enter Entrance Stairs-Rails: Right Home Layout: One level Home Equipment: None      Prior Function Level of Independence: Independent      Comments: Pt drives, does not work, on disability. Does not cook   PT Goals (current goals can now be found in the care plan section) Acute Rehab PT Goals Patient Stated Goal: not stated Progress towards PT goals: Progressing toward goals    Frequency           PT Plan Current plan remains appropriate  Co-evaluation              AM-PAC PT "6 Clicks" Daily Activity  Outcome Measure  Difficulty turning over in bed (including adjusting bedclothes, sheets and blankets)?: A Little Difficulty moving from lying on back to sitting on the side of the bed? : A Little Difficulty sitting down on and standing up from a chair with arms (e.g., wheelchair, bedside commode, etc,.)?: A Little Help needed moving to and from a bed to chair (including a wheelchair)?: None Help needed walking in hospital room?: None Help needed climbing  3-5 steps with a railing? : None 6 Click Score: 21    End of Session Equipment Utilized During Treatment: Back brace;Gait belt Activity Tolerance: Patient tolerated treatment well Patient left: in chair;with call bell/phone within reach;with family/visitor present Nurse Communication: Mobility status;Precautions       Time: 8185-6314 PT Time Calculation (min) (ACUTE ONLY): 16 min  Charges:  $Gait Training: 8-22 mins                    G Codes:       Elwyn Reach, [place]    [place] 11/21/2017, 10:40 AM

## 2017-11-21 NOTE — Evaluation (Signed)
Occupational Therapy Evaluation Patient Details Name: Annette Rios MRN: [id number] DOB: [birth year] Today's Date: 11/21/2017    History of Present Illness 62 y.o. female admitted on 11/20/17 for elective L4-S1 PLIF.  Pt with significant PMH of pre diabetes, HTN, claustrophobia, and bil carpal tunnel release.     Clinical Impression   Pt s/p above. Pt independent with ADLs, PTA. Feel pt will benefit from acute OT to increase independence prior to d/c. Do not feel pt will need follow up OT upon d/c.     Follow Up Recommendations  No OT follow up;Supervision - Intermittent    Equipment Recommendations  3 in 1 bedside commode    Recommendations for Other Services       Precautions / Restrictions Precautions Precautions: Back Precaution Booklet Issued: No Precaution Comments: educated on back precautions Required Braces or Orthoses: Spinal Brace Spinal Brace: Applied in sitting position Restrictions Weight Bearing Restrictions: No      Mobility Bed Mobility Overal bed mobility: Needs Assistance Bed Mobility: Rolling;Sidelying to Sit Rolling: Supervision Sidelying to sit: Supervision       General bed mobility comments: cues given  Transfers Overall transfer level: Needs assistance   Transfers: Sit to/from Stand Sit to Stand: Min guard              Balance    Min A (hand held assist) for ambulation and then pt able to ambulate with Min guard assist.                                        ADL either performed or assessed with clinical judgement   ADL Overall ADL's : Needs assistance/impaired Eating/Feeding: Supervision/ safety(sitting/standing-cue not to twist when reaching for cup)               Upper Body Dressing : Minimal assistance;Sitting   Lower Body Dressing: Minimal assistance;Sit to/from stand   Toilet Transfer: Minimal assistance;Ambulation(sit to stand from bed)           Functional mobility during ADLs: Minimal  assistance(Min A-Min guard assist) General ADL Comments: Educated on techniques for LB dressing. Also, educated on sockaid and Secondary school teacher. Educated on back brace. Explained what pt could use for toilet aid if needed. Educated on tips to help maintain back precautions such as use of cup for oral care and placement of grooming items.  Pt able to ambulate with Min A (hand held) - Min guard assist.      Vision         Perception     Praxis      Pertinent Vitals/Pain Pain Assessment: 0-10 Pain Score: 7  Pain Location: lower back Pain Descriptors / Indicators: Aching Pain Intervention(s): Monitored during session;Repositioned     Hand Dominance Right   Extremity/Trunk Assessment Upper Extremity Assessment Upper Extremity Assessment: Overall WFL for tasks assessed   Lower Extremity Assessment Lower Extremity Assessment: Defer to PT evaluation       Communication Communication Communication: No difficulties   Cognition Arousal/Alertness: Awake/alert Behavior During Therapy: WFL for tasks assessed/performed Overall Cognitive Status: Within Functional Limits for tasks assessed                                     General Comments       Exercises     Shoulder Instructions  Home Living Family/patient expects to be discharged to:: Private residence Living Arrangements: Children Available Help at Discharge: Family;Available 24 hours/day Type of Home: Apartment Home Access: Stairs to enter Entrance Stairs-Number of Steps: 2 sets of 7 steps Entrance Stairs-Rails: Right Home Layout: One level     Bathroom Shower/Tub: Teacher, early years/pre: Standard     Home Equipment: None          Prior Functioning/Environment Level of Independence: Independent        Comments: Pt drives, does not work, on disability. Does not cook        OT Problem List: Decreased range of motion;Decreased activity tolerance;Pain;Impaired balance (sitting  and/or standing);Decreased knowledge of use of DME or AE;Decreased knowledge of precautions      OT Treatment/Interventions: Self-care/ADL training;DME and/or AE instruction;Therapeutic activities;Balance training;Patient/family education    OT Goals(Current goals can be found in the care plan section) Acute Rehab OT Goals Patient Stated Goal: not stated OT Goal Formulation: With patient Time For Goal Achievement: 11/28/17 Potential to Achieve Goals: Good ADL Goals Pt Will Perform Lower Body Dressing: with set-up;with supervision;sit to/from stand Pt Will Transfer to Toilet: ambulating;with set-up Pt Will Perform Toileting - Clothing Manipulation and hygiene: sit to/from stand;with modified independence Pt Will Perform Tub/Shower Transfer: Tub transfer;with supervision;with set-up;3 in 1 Additional ADL Goal #1: Pt will independently verbalize 3/3 back precautions and maintain during session. Additional ADL Goal #2: Pt will don/doff back brace with setup assist.  OT Frequency: Min 2X/week   Barriers to D/C:            Co-evaluation              AM-PAC PT "6 Clicks" Daily Activity     Outcome Measure Help from another person eating meals?: A Little Help from another person taking care of personal grooming?: A Little Help from another person toileting, which includes using toliet, bedpan, or urinal?: A Little Help from another person bathing (including washing, rinsing, drying)?: A Little Help from another person to put on and taking off regular upper body clothing?: A Little Help from another person to put on and taking off regular lower body clothing?: A Little 6 Click Score: 18   End of Session Equipment Utilized During Treatment: Gait belt;Other (comment);Back brace(AE)  Activity Tolerance: Patient tolerated treatment well Patient left: in chair;with call bell/phone within reach;with family/visitor present  OT Visit Diagnosis: Pain Pain - Right/Left: (back) Pain -  part of body: (back)                Time: 5597-4163 OT Time Calculation (min): 18 min Charges:  OT General Charges $OT Visit: 1 Visit OT Evaluation $OT Eval Moderate Complexity: 1 Mod G-Codes:     Ines Rebel L Jalaya Sarver OTR/L 11/21/2017, 9:49 AM

## 2017-11-22 NOTE — Progress Notes (Signed)
Physical Therapy Treatment Patient Details Name: GWENLYN HOTTINGER MRN: [id number] DOB: 09-18-1956 Today's Date: 11/22/2017    History of Present Illness 62 y.o. female admitted on 11/20/17 for elective L4-S1 PLIF.  Pt with significant PMH of pre diabetes, HTN, claustrophobia, and bil carpal tunnel release.      PT Comments    Pt progressing well.  Moving slowly but without significant problems, and able to perform all tasks well enough for d/c home.  Instructed to walk with nursing.  No further acute needs, PT signing off.   Follow Up Recommendations  No PT follow up     Equipment Recommendations  None recommended by PT    Recommendations for Other Services       Precautions / Restrictions Precautions Precautions: Back Precaution Booklet Issued: No Precaution Comments: reviewed back precautions Required Braces or Orthoses: Spinal Brace Spinal Brace: Applied in sitting position Restrictions Weight Bearing Restrictions: No    Mobility  Bed Mobility Overal bed mobility: Needs Assistance Bed Mobility: Sidelying to Sit Rolling: Supervision Sidelying to sit: Supervision       General bed mobility comments: flattened bed and used mattress to simulate home, pt able to perform  Transfers Overall transfer level: Modified independent Equipment used: None Transfers: Sit to/from Stand Sit to Stand: Modified independent (Device/Increase time)            Ambulation/Gait Ambulation/Gait assistance: Modified independent (Device/Increase time) Ambulation Distance (Feet): 200 Feet Assistive device: None Gait Pattern/deviations: Step-through pattern;Decreased stride length   Gait velocity interpretation: Below normal speed for age/gender General Gait Details: pt instructed to allow stride length to increase to allow for pelvic rotation and relax abdonimal muscles; discussed role of brace to limit muscular strain and promote normalized tone   Stairs Stairs: Yes   Stair  Management: Alternating pattern;Forwards;One rail Right Number of Stairs: 10    Wheelchair Mobility    Modified Rankin (Stroke Patients Only)       Balance Overall balance assessment: Mild deficits observed, not formally tested   Sitting balance-Leahy Scale: Good       Standing balance-Leahy Scale: Good                              Cognition Arousal/Alertness: Awake/alert Behavior During Therapy: WFL for tasks assessed/performed Overall Cognitive Status: Within Functional Limits for tasks assessed                                 General Comments: met pt in hall walking with OT, continued with PT to room/conclusion      Exercises      General Comments        Pertinent Vitals/Pain Pain Assessment: 0-10 Pain Score: 3  Pain Location: low back Pain Descriptors / Indicators: Aching Pain Intervention(s): Limited activity within patient's tolerance;Monitored during session;Repositioned    Home Living                      Prior Function            PT Goals (current goals can now be found in the care plan section) Acute Rehab PT Goals Patient Stated Goal: not stated PT Goal Formulation: All assessment and education complete, DC therapy Progress towards PT goals: Goals met/education completed, patient discharged from PT    Frequency           PT  Plan Current plan remains appropriate    Co-evaluation              AM-PAC PT "6 Clicks" Daily Activity  Outcome Measure  Difficulty turning over in bed (including adjusting bedclothes, sheets and blankets)?: A Little Difficulty moving from lying on back to sitting on the side of the bed? : A Little Difficulty sitting down on and standing up from a chair with arms (e.g., wheelchair, bedside commode, etc,.)?: None Help needed moving to and from a bed to chair (including a wheelchair)?: None Help needed walking in hospital room?: None Help needed climbing 3-5 steps with a  railing? : None 6 Click Score: 22    End of Session Equipment Utilized During Treatment: Back brace Activity Tolerance: Patient tolerated treatment well Patient left: in bed;with nursing/sitter in room Nurse Communication: Mobility status PT Visit Diagnosis: Other abnormalities of gait and mobility (R26.89)     Time: 8950-1156 PT Time Calculation (min) (ACUTE ONLY): 21 min  Charges:  $Gait Training: 8-22 mins $Therapeutic Activity: 8-22 mins                    G Codes:       Kearney Hard, PT, DPT Board Certified Geriatric Clinical Specialist   Herbie Drape 11/22/2017, 12:21 PM

## 2017-11-22 NOTE — Progress Notes (Signed)
Patient ID: Annette Rios, female   DOB: 21-Nov-1955, 62 y.o.   MRN: [id number] Vital signs are stable Patient is starting to ambulate Incision is clean and dry and dressing is intact Still considerable back pain but clearly making good progress Encourage mobilization

## 2017-11-22 NOTE — Progress Notes (Signed)
Occupational Therapy Treatment Patient Details Name: Annette Rios MRN: [id number] DOB: 05-29-1956 Today's Date: 11/22/2017    History of present illness 62 y.o. female admitted on 11/20/17 for elective L4-S1 PLIF.  Pt with significant PMH of pre diabetes, HTN, claustrophobia, and bil carpal tunnel release.     OT comments  Education provided in session. Plan to have additional OT session prior to d/c. Pt progressing toward goals.   Follow Up Recommendations  No OT follow up;Supervision - Intermittent    Equipment Recommendations  3 in 1 bedside commode    Recommendations for Other Services      Precautions / Restrictions Precautions Precautions: Back Precaution Booklet Issued: No Precaution Comments: reviewed back precautions Required Braces or Orthoses: Spinal Brace Spinal Brace: Applied in sitting position(and adjusted in standing) Restrictions Weight Bearing Restrictions: No       Mobility Bed Mobility Overal bed mobility: Needs Assistance Bed Mobility: Sidelying to Sit   Sidelying to sit: Supervision       General bed mobility comments: used rail.  Transfers Overall transfer level: Needs assistance   Transfers: Sit to/from Stand Sit to Stand: Min guard              Balance    Supervision for ambulation. Min guard for sit to stand from bed.                                       ADL either performed or assessed with clinical judgement   ADL Overall ADL's : Needs assistance/impaired                 Upper Body Dressing : Moderate assistance;Sitting;Standing Upper Body Dressing Details (indicate cue type and reason): back brace Lower Body Dressing: Supervision/safety;Sitting/lateral leans(donned/doffed socks-cue for precautions)   Toilet Transfer: Min guard;Ambulation(sit to stand from bed)       Tub/ Shower Transfer: Tub transfer;Minimal assistance;Ambulation;3 in 1   Functional mobility during ADLs: Minimal  assistance(Supervision-ambulation; Min A-tub transfer) General ADL Comments: Educated on safety such as safe footwear, rugs/items on floor. recommended someone be with her for tub transfer. Educated on tub transfer technique and pt practiced simulated transfer. Educated on options of positioning 3 in 1 in tub. Educated on back brace wear. Educated on use of cup for oral care and placement of grooming items to prevent breaking precautions.  Educated on what pt could use for toilet aid if needed.     Vision       Perception     Praxis      Cognition Arousal/Alertness: Awake/alert Behavior During Therapy: WFL for tasks assessed/performed Overall Cognitive Status: No family/caregiver present to determine baseline cognitive functioning(decreased short term memory)                                          Exercises     Shoulder Instructions       General Comments      Pertinent Vitals/ Pain       Pain Assessment: 0-10 Pain Score: 4  Pain Location: low back Pain Descriptors / Indicators: Aching Pain Intervention(s): Monitored during session  Home Living  Prior Functioning/Environment              Frequency  Min 2X/week        Progress Toward Goals  OT Goals(current goals can now be found in the care plan section)  Progress towards OT goals: Progressing toward goals  Acute Rehab OT Goals Patient Stated Goal: not stated OT Goal Formulation: With patient Time For Goal Achievement: 11/28/17 Potential to Achieve Goals: Good ADL Goals Pt Will Perform Lower Body Dressing: with set-up;with supervision;sit to/from stand Pt Will Transfer to Toilet: ambulating;with set-up Pt Will Perform Toileting - Clothing Manipulation and hygiene: sit to/from stand;with modified independence Pt Will Perform Tub/Shower Transfer: Tub transfer;with supervision;with set-up;3 in 1 Additional ADL Goal #1: Pt will  independently verbalize 3/3 back precautions and maintain during session. Additional ADL Goal #2: Pt will don/doff back brace with setup assist.  Plan Discharge plan remains appropriate    Co-evaluation                 AM-PAC PT "6 Clicks" Daily Activity     Outcome Measure   Help from another person eating meals?: None Help from another person taking care of personal grooming?: A Little Help from another person toileting, which includes using toliet, bedpan, or urinal?: A Little Help from another person bathing (including washing, rinsing, drying)?: A Little Help from another person to put on and taking off regular upper body clothing?: A Lot(back brace) Help from another person to put on and taking off regular lower body clothing?: A Little 6 Click Score: 18    End of Session Equipment Utilized During Treatment: Gait belt;Back brace;Other (comment)(AE)  OT Visit Diagnosis: Pain Pain - Right/Left: (back) Pain - part of body: (back)   Activity Tolerance Patient tolerated treatment well   Patient Left Other (comment)(with PT in hallway)   Nurse Communication          Time: 3220-2542 OT Time Calculation (min): 24 min  Charges: OT General Charges $OT Visit: 1 Visit OT Treatments $Self Care/Home Management : 23-37 mins  Jshawn Hurta L Deshannon Hinchliffe OTR/L 11/22/2017, 12:00 PM

## 2017-11-23 MED ORDER — METHOCARBAMOL 500 MG PO TABS: 500.0000 mg | ORAL_TABLET | Freq: Four times a day (QID) | ORAL | 1 refills | Status: DC | PRN

## 2017-11-23 MED ORDER — HYDROCODONE-ACETAMINOPHEN 5-325 MG PO TABS: 1.0000 | ORAL_TABLET | ORAL | 0 refills | Status: DC | PRN

## 2017-11-23 MED FILL — Thrombin For Soln 20000 Unit: CUTANEOUS | Qty: 1 | Status: AC

## 2017-11-23 NOTE — Progress Notes (Addendum)
Subjective: Patient reports "I'm doing pretty good"  Objective: Vital signs in last 24 hours: Temp:  [98.5 F (36.9 C)-101 F (38.3 C)] 98.5 F (36.9 C) (02/18 0403) Pulse Rate:  [70-90] 70 (02/18 0403) Resp:  [18-20] 18 (02/18 0403) BP: (108-143)/(61-77) 108/61 (02/18 0403) SpO2:  [96 %-100 %] 99 % (02/18 0403)  Intake/Output from previous day: 02/17 0701 - 02/18 0700 In: 960 [P.O.:960] Out: 5 [Urine:5] Intake/Output this shift: No intake/output data recorded.  Alert, conversant, sitting in chair. Incision without erythema, swelling or drainage beneath honeycomb drsg and Dermabond. Good strength BLE. No BM yet, but plans to try Dulcolax suppository this morning. No discomfort. Passing gas.   Lab Results: No results for input(s): WBC, HGB, HCT, PLT in the last 72 hours. BMET No results for input(s): NA, K, CL, CO2, GLUCOSE, BUN, CREATININE, CALCIUM in the last 72 hours.  Studies/Results: No results found.  Assessment/Plan: Improving  LOS: 3 days  Per DrStern, dc to home after bowels move. HHAide, Nurse, PT for safety eval. Pt verbalizes understanding of dc instructions. Rx's for Norco 5/325 and Robaxin 500mg  will be eRx'ed from office to pts pharmacy. Pt will fu in office in [place].   Verdis Prime 11/23/2017, 7:36 AM    Patient is doing well.  Will work on bowels this am and D/C home.

## 2017-11-23 NOTE — Progress Notes (Addendum)
   Case Management Note  Patient Details  Name: Annette Rios MRN: [id number] Date of Birth: Feb 05, 1956  Subjective/Objective:        Please see previous NCM notes            Action/Plan: Contacted [facility] for 3n1 bedside commode for home. Notified [facility] of scheduled dc home today with [place], PT and aide. Jonnie Finner RN CCM Case Mgmt phone [phone]  Expected Discharge Date:  11/23/17               Expected Discharge Plan:  Annette Rios  In-House Referral:  NA  Discharge planning Services  CM Consult  Post Acute Care Choice:  Home Health Choice offered to:  Patient  DME Arranged:  3-N-1 DME Agency:  [place]:  PT, RN, Nurse's Aide [place] Agency:  [facility]  Status of Service:  Completed, signed off  If discussed at [place] of Stay Meetings, dates discussed:    Additional Comments:  Erenest Rasher, RN 11/23/2017, 10:27 AM

## 2017-11-23 NOTE — Discharge Summary (Signed)
Physician Discharge Summary  Patient ID: Annette Rios MRN: [id number] DOB/AGE: 1956-03-17 62 y.o.  Admit date: 11/20/2017 Discharge date: 11/23/2017  Admission Diagnoses: Spinal stenosis of lumbar region with neurogenic claudication, lumbar scoliosis, spondylolisthesis, herniated lumbar disc, radiculopathy, lumbago    Discharge Diagnoses: Spinal stenosis of lumbar region with neurogenic claudication, lumbar scoliosis, spondylolisthesis, herniated lumbar disc, radiculopathy, lumbago sp L4-5 L5-S1 Posterior lumbar interbody fusion (N/A) - L4-5 L5-S1 Posterior lumbar interbody fusion with segmental instrumentation, posterolateral arthrodesis with autograft and allograft     Active Problems:   Lumbar spine scoliosis   Discharged Condition: good  Hospital Course: Annette Rios was admitted for surgery with dx stenosis. Scoliosis, and radiculopathy. Following uncomplicated lumbar decompression and fusion L4-S1, she recovered well and transferred to [facility] for nursing care and therapies. She has progressed steadily.   Consults: None  Significant Diagnostic Studies: radiology: X-Ray: intra-op  Treatments: surgery: L4-5 L5-S1 Posterior lumbar interbody fusion (N/A) - L4-5 L5-S1 Posterior lumbar interbody fusion with segmental instrumentation, posterolateral arthrodesis with autograft and allograft    Discharge Exam: Blood pressure 108/61, pulse 70, temperature 98.5 F (36.9 C), temperature source Oral, resp. rate 18, height 5\' 8"  (1.727 m), weight 83.5 kg (184 lb 1.4 oz), SpO2 99 %. Alert, conversant, sitting in chair. Incision without erythema, swelling or drainage beneath honeycomb drsg and Dermabond. Good strength BLE. No BM yet, but plans to try Dulcolax suppository this morning. No discomfort. Passing gas.    Disposition: 01-Home or [place], Nurse, PT for safety eval. Pt verbalizes understanding of dc instructions. Rx'sfor Norco 5/325 and Robaxin  500mg will be eRx'ed from office to pts pharmacy. Pt will fu in office in [place].       Allergies as of 11/23/2017      Reactions   Penicillins Rash, Other (See Comments)   PATIENT HAS HAD A PCN REACTION WITH IMMEDIATE RASH, FACIAL/TONGUE/THROAT SWELLING, SOB, OR LIGHTHEADEDNESS WITH HYPOTENSION:  #  #  #  YES  #  #  #   Has patient had a PCN reaction causing severe rash involving mucus membranes or skin necrosis: NO Has patient had a PCN reaction that required hospitalization NO Has patient had a PCN reaction occurring within the last 10 years: NO If all of the above answers are "NO", then may proceed with Cephalosporin use.      Medication List    STOP taking these medications   naproxen sodium 220 MG tablet Commonly known as:  ALEVE     TAKE these medications   aspirin 81 MG tablet Take 81 mg by mouth daily.   atorvastatin 80 MG tablet Commonly known as:  LIPITOR TAKE 1 TABLET(80 MG) BY MOUTH DAILY   benazepril-hydrochlorthiazide 20-12.5 MG tablet Commonly known as:  LOTENSIN HCT Take 1 tablet by mouth daily.   benzonatate 200 MG capsule Commonly known as:  TESSALON Take 1 capsule (200 mg total) by mouth 3 (three) times daily as needed for cough.   cyclobenzaprine 10 MG tablet Commonly known as:  FLEXERIL Take 1 tablet (10 mg total) by mouth daily as needed. What changed:  reasons to take this   esomeprazole 40 MG capsule Commonly known as:  NEXIUM Take 1 capsule (40 mg total) by mouth daily as needed (for acid reflux).   FISH OIL PO Take 1 capsule by mouth daily.   HYDROcodone-acetaminophen 5-325 MG tablet Commonly known as:  NORCO/VICODIN Take 1-2 tablets by mouth every 4 (four) hours as needed for severe pain ((score 7 to 10)).  methocarbamol 500 MG tablet Commonly known as:  ROBAXIN Take 1 tablet (500 mg total) by mouth every 6 (six) hours as needed for muscle spasms.   ONE TOUCH ULTRA TEST test strip Generic drug:  glucose blood TEST BLOOD  SUGAR AS DIRECTED   traMADol 50 MG tablet Commonly known as:  ULTRAM TAKE 1-2 TABLETS 1-2 TIMES DAILY AS NEEDED What changed:  See the new instructions.        Signed: Peggyann Shoals, MD 11/23/2017, 8:30 AM

## 2017-11-23 NOTE — Care Management Important Message (Signed)
Important Message  Patient Details  Name: Annette Rios MRN: [id number] Date of Birth: Jul 17, 1956   Medicare Important Message Given:  Yes    Erenest Rasher, RN 11/23/2017, 10:18 AM

## 2017-11-23 NOTE — Progress Notes (Signed)
Occupational Therapy Treatment Patient Details Name: Annette Rios MRN: [id number] DOB: [birth year] Today's Date: 11/23/2017    History of present illness 62 y.o. female admitted on 11/20/17 for elective L4-S1 PLIF.  Pt with significant PMH of pre diabetes, HTN, claustrophobia, and bil carpal tunnel release.     OT comments  On OT arrival, pt reports "waiting for suppository to work" and declined OOB mobility accordingly. She was educated concerning compensatory LB ADL strategies and verbalizes understanding. Reviewed back precautions and pt able to state 3/3. She is additionally able to teach back tub transfer method with 3-in-1. Discussed potential use of toilet aid and pt declined after education reporting that she prefers her family to assist her with this. Pt reports plan to D/C home today with 24 hour assistance from her family.    Follow Up Recommendations  No OT follow up;Supervision - Intermittent    Equipment Recommendations  3 in 1 bedside commode    Recommendations for Other Services      Precautions / Restrictions Precautions Precautions: Back Precaution Booklet Issued: No Precaution Comments: reviewed back precautions Required Braces or Orthoses: Spinal Brace Spinal Brace: Applied in sitting position Restrictions Weight Bearing Restrictions: No       Mobility Bed Mobility                  Transfers                      Balance                                           ADL either performed or assessed with clinical judgement   ADL Overall ADL's : Needs assistance/impaired                                       General ADL Comments: Despite maximum encouragement, pt declined OOB as she reports waiting for suppository to work. Able to verbalize 3/3 back precautions, brace wear schedule, and method to use 3-in-1 as shower seat. Verbally reviewed compensatory ADL strategies with pt and she verbalizes  understanding and is able to teach back.      Vision       Perception     Praxis      Cognition Arousal/Alertness: Awake/alert Behavior During Therapy: WFL for tasks assessed/performed Overall Cognitive Status: Within Functional Limits for tasks assessed                                          Exercises     Shoulder Instructions       General Comments Pt declined toilet aid after education. She reports preference to have assistance from her family and [facility] aide rather than utilize AE.     Pertinent Vitals/ Pain       Pain Assessment: Faces Faces Pain Scale: Hurts a little bit Pain Location: low back Pain Descriptors / Indicators: Aching Pain Intervention(s): Monitored during session  Home Living  Prior Functioning/Environment              Frequency  Min 2X/week        Progress Toward Goals  OT Goals(current goals can now be found in the care plan section)  Progress towards OT goals: Progressing toward goals  Acute Rehab OT Goals Patient Stated Goal: not stated OT Goal Formulation: With patient Time For Goal Achievement: 11/28/17 Potential to Achieve Goals: Good  Plan Discharge plan remains appropriate    Co-evaluation                 AM-PAC PT "6 Clicks" Daily Activity     Outcome Measure   Help from another person eating meals?: None Help from another person taking care of personal grooming?: A Little Help from another person toileting, which includes using toliet, bedpan, or urinal?: A Little Help from another person bathing (including washing, rinsing, drying)?: A Little Help from another person to put on and taking off regular upper body clothing?: A Lot Help from another person to put on and taking off regular lower body clothing?: A Little 6 Click Score: 18    End of Session    OT Visit Diagnosis: Pain Pain - Right/Left: (back) Pain - part of body:  (back)   Activity Tolerance Patient tolerated treatment well   Patient Left in bed;with call bell/phone within reach   Nurse Communication          Time: 2641-5830 OT Time Calculation (min): 12 min  Charges: OT General Charges $OT Visit: 1 Visit OT Treatments $Self Care/Home Management : 8-22 mins  Norman Herrlich, MS OTR/L  Pager: [place] A Jacobb Alen 11/23/2017, 10:36 AM

## 2017-11-23 NOTE — Progress Notes (Signed)
Patient discharge instructions given including printed prescriptions; questions answered. IV removed. Equipment delivered to room. Patient waiting for ride since 1200. Patient taken to car via wheelchair at 1450.

## 2017-11-24 DIAGNOSIS — M6281 Muscle weakness (generalized): Secondary | ICD-10-CM | POA: Diagnosis not present

## 2017-11-24 DIAGNOSIS — R7303 Prediabetes: Secondary | ICD-10-CM | POA: Diagnosis not present

## 2017-11-24 DIAGNOSIS — Z4789 Encounter for other orthopedic aftercare: Secondary | ICD-10-CM | POA: Diagnosis not present

## 2017-11-24 DIAGNOSIS — I10 Essential (primary) hypertension: Secondary | ICD-10-CM | POA: Diagnosis not present

## 2017-11-26 DIAGNOSIS — M6281 Muscle weakness (generalized): Secondary | ICD-10-CM | POA: Diagnosis not present

## 2017-11-26 DIAGNOSIS — Z4789 Encounter for other orthopedic aftercare: Secondary | ICD-10-CM | POA: Diagnosis not present

## 2017-11-26 DIAGNOSIS — I10 Essential (primary) hypertension: Secondary | ICD-10-CM | POA: Diagnosis not present

## 2017-11-26 DIAGNOSIS — R7303 Prediabetes: Secondary | ICD-10-CM | POA: Diagnosis not present

## 2017-11-27 DIAGNOSIS — M6281 Muscle weakness (generalized): Secondary | ICD-10-CM | POA: Diagnosis not present

## 2017-11-27 DIAGNOSIS — Z4789 Encounter for other orthopedic aftercare: Secondary | ICD-10-CM | POA: Diagnosis not present

## 2017-11-27 DIAGNOSIS — R7303 Prediabetes: Secondary | ICD-10-CM | POA: Diagnosis not present

## 2017-11-27 DIAGNOSIS — I10 Essential (primary) hypertension: Secondary | ICD-10-CM | POA: Diagnosis not present

## 2017-11-30 DIAGNOSIS — I10 Essential (primary) hypertension: Secondary | ICD-10-CM | POA: Diagnosis not present

## 2017-11-30 DIAGNOSIS — Z4789 Encounter for other orthopedic aftercare: Secondary | ICD-10-CM | POA: Diagnosis not present

## 2017-11-30 DIAGNOSIS — R7303 Prediabetes: Secondary | ICD-10-CM | POA: Diagnosis not present

## 2017-11-30 DIAGNOSIS — M6281 Muscle weakness (generalized): Secondary | ICD-10-CM | POA: Diagnosis not present

## 2017-12-01 DIAGNOSIS — I10 Essential (primary) hypertension: Secondary | ICD-10-CM | POA: Diagnosis not present

## 2017-12-01 DIAGNOSIS — Z4789 Encounter for other orthopedic aftercare: Secondary | ICD-10-CM | POA: Diagnosis not present

## 2017-12-01 DIAGNOSIS — R7303 Prediabetes: Secondary | ICD-10-CM | POA: Diagnosis not present

## 2017-12-01 DIAGNOSIS — M6281 Muscle weakness (generalized): Secondary | ICD-10-CM | POA: Diagnosis not present

## 2017-12-03 DIAGNOSIS — I10 Essential (primary) hypertension: Secondary | ICD-10-CM | POA: Diagnosis not present

## 2017-12-03 DIAGNOSIS — M6281 Muscle weakness (generalized): Secondary | ICD-10-CM | POA: Diagnosis not present

## 2017-12-03 DIAGNOSIS — R7303 Prediabetes: Secondary | ICD-10-CM | POA: Diagnosis not present

## 2017-12-03 DIAGNOSIS — Z4789 Encounter for other orthopedic aftercare: Secondary | ICD-10-CM | POA: Diagnosis not present

## 2017-12-17 ENCOUNTER — Telehealth: Payer: Self-pay | Admitting: Family Medicine

## 2017-12-17 NOTE — Telephone Encounter (Signed)
Pt has been getting this real bad cough and I think she is trying to say she thinks its coming from her blood pressure medication. She is wanting Dr Nori Riis to find a few replacement blood pressure medications and the pt wants to be sent a copy of the side affects of the meds. You can call her at her home number to discuss.

## 2017-12-18 MED ORDER — AMLODIPINE BESYLATE 5 MG PO TABS: 5.0000 mg | ORAL_TABLET | Freq: Every day | ORAL | 1 refills | Status: DC

## 2017-12-18 NOTE — Telephone Encounter (Signed)
Dear [name] Team I called in a different one. She needs an appintment wit me to adjust dose Pharmacy will give her a printout with any possible side effects. Most common is mild ankle swelling THANKS! Dorcas Mcmurray

## 2017-12-18 NOTE — Telephone Encounter (Signed)
Pt informed of below. Zimmerman Rumple, Bayleigh Loflin D, CMA  

## 2017-12-21 ENCOUNTER — Other Ambulatory Visit: Payer: Self-pay | Admitting: Family Medicine

## 2017-12-21 DIAGNOSIS — M5416 Radiculopathy, lumbar region: Secondary | ICD-10-CM | POA: Diagnosis not present

## 2017-12-21 DIAGNOSIS — M545 Low back pain: Secondary | ICD-10-CM | POA: Diagnosis not present

## 2017-12-21 DIAGNOSIS — Z6828 Body mass index (BMI) 28.0-28.9, adult: Secondary | ICD-10-CM | POA: Diagnosis not present

## 2017-12-21 DIAGNOSIS — M48062 Spinal stenosis, lumbar region with neurogenic claudication: Secondary | ICD-10-CM | POA: Diagnosis not present

## 2017-12-21 DIAGNOSIS — R030 Elevated blood-pressure reading, without diagnosis of hypertension: Secondary | ICD-10-CM | POA: Diagnosis not present

## 2017-12-23 ENCOUNTER — Encounter (HOSPITAL_COMMUNITY): Payer: Self-pay | Admitting: Neurosurgery

## 2017-12-29 ENCOUNTER — Encounter: Payer: Self-pay | Admitting: Family Medicine

## 2017-12-29 DIAGNOSIS — Z9049 Acquired absence of other specified parts of digestive tract: Secondary | ICD-10-CM | POA: Insufficient documentation

## 2018-01-06 ENCOUNTER — Other Ambulatory Visit: Payer: Self-pay | Admitting: Family Medicine

## 2018-01-13 ENCOUNTER — Other Ambulatory Visit: Payer: Self-pay

## 2018-01-13 ENCOUNTER — Encounter: Payer: Self-pay | Admitting: Family Medicine

## 2018-01-13 ENCOUNTER — Ambulatory Visit (INDEPENDENT_AMBULATORY_CARE_PROVIDER_SITE_OTHER): Payer: Medicare Other | Admitting: Family Medicine

## 2018-01-13 VITALS — BP 142/94 | HR 73 | Temp 99.1°F | Ht 68.0 in | Wt 191.6 lb

## 2018-01-13 DIAGNOSIS — E7800 Pure hypercholesterolemia, unspecified: Secondary | ICD-10-CM

## 2018-01-13 DIAGNOSIS — I10 Essential (primary) hypertension: Secondary | ICD-10-CM | POA: Diagnosis not present

## 2018-01-13 DIAGNOSIS — M545 Low back pain: Secondary | ICD-10-CM | POA: Diagnosis not present

## 2018-01-13 DIAGNOSIS — G8929 Other chronic pain: Secondary | ICD-10-CM

## 2018-01-13 MED ORDER — AMLODIPINE BESYLATE 5 MG PO TABS: 5.0000 mg | ORAL_TABLET | Freq: Every day | ORAL | 3 refills | Status: DC

## 2018-01-13 NOTE — Progress Notes (Signed)
    CHIEF COMPLAINT / HPI: #1.  Follow-up back pain.  Significantly improved.  Still has some occasional intermittent low back pain but this seems to be more from arthritis.  No longer having the pain radiating down the back of her leg.  Her surgeon has completed his follow-up. 2.  Hypertension: Has not been taking her medicine for several weeks maybe months.  Decided she just want to go off it.  Wants to know if Hemp oil would do the same thing for her as her medication.  Underlying goal is that she wants her organs to be healthy so she can donate him to science. 3.  Hypercholesterolemia: Has been missing many of these doses of medication as well.  Same reason as in #2.  REVIEW OF SYSTEMS: Denies chest pain, no lower extremity edema, still has occasional nonproductive cough worse.  No headaches.  No fever.  No unusual weight change.  Back pain improved.  PERTINENT  PMH / PSH: I have reviewed the patient's medications, allergies, past medical and surgical history, smoking status and updated in the EMR as appropriate.   OBJECTIVE:  Vital signs reviewed. GENERAL: Well-developed, well-nourished, no acute distress. CARDIOVASCULAR: Regular rate and rhythm no murmur gallop or rub LUNGS: Clear to auscultation bilaterally, no rales or wheeze. ABDOMEN: Soft positive bowel sounds NEURO: No gross focal neurological deficits. MSK: Movement of extremity x 4.    ASSESSMENT / PLAN: Please see problem oriented charting for details

## 2018-01-13 NOTE — Assessment & Plan Note (Signed)
Significant improvement in her symptoms from spinal stenosis.  She continues to have low back pain secondary to arthritic changes continue tramadol for that as prescribed.

## 2018-01-13 NOTE — Assessment & Plan Note (Signed)
We will check her LDL today although I am not sure how consistently she has been taking her medication.

## 2018-01-13 NOTE — Patient Instructions (Signed)
Go back on your blood pressure medicine See me in 6-8 weeks STAY on your cholesterol and your blood pressure medicine I will send you a note about your cholesterol labs

## 2018-01-13 NOTE — Assessment & Plan Note (Signed)
Initially I was going to increase her amlodipine to 10 mg but then she finally confessed that she not been taking her medicine.  We spent a long time talking about medications, hypertension, stroke risk etc.  Spent greater than 50% of our 25-minute office visit in counseling and education regarding these issues.  Restart her hypertension medicines as prescribed, follow-up 4-6 weeks.

## 2018-01-14 LAB — LIPID PANEL
CHOLESTEROL TOTAL: 132 mg/dL (ref 100–199)
Chol/HDL Ratio: 2.6 ratio (ref 0.0–4.4)
HDL: 50 mg/dL (ref >39)
LDL CALC: 68 mg/dL (ref 0–99)
Triglycerides: 71 mg/dL (ref 0–149)
VLDL Cholesterol Cal: 14 mg/dL (ref 5–40)

## 2018-01-19 ENCOUNTER — Encounter: Payer: Self-pay | Admitting: Family Medicine

## 2018-02-16 ENCOUNTER — Other Ambulatory Visit: Payer: Self-pay

## 2018-02-17 DIAGNOSIS — M5417 Radiculopathy, lumbosacral region: Secondary | ICD-10-CM | POA: Diagnosis not present

## 2018-02-17 DIAGNOSIS — Z6828 Body mass index (BMI) 28.0-28.9, adult: Secondary | ICD-10-CM | POA: Diagnosis not present

## 2018-02-17 DIAGNOSIS — M545 Low back pain: Secondary | ICD-10-CM | POA: Diagnosis not present

## 2018-02-17 DIAGNOSIS — M48062 Spinal stenosis, lumbar region with neurogenic claudication: Secondary | ICD-10-CM | POA: Diagnosis not present

## 2018-02-17 DIAGNOSIS — R030 Elevated blood-pressure reading, without diagnosis of hypertension: Secondary | ICD-10-CM | POA: Diagnosis not present

## 2018-02-17 DIAGNOSIS — M5416 Radiculopathy, lumbar region: Secondary | ICD-10-CM | POA: Diagnosis not present

## 2018-02-19 MED ORDER — TRAMADOL HCL 50 MG PO TABS: ORAL_TABLET | ORAL | 3 refills | Status: DC

## 2018-05-21 ENCOUNTER — Other Ambulatory Visit: Payer: Self-pay | Admitting: Family Medicine

## 2018-05-21 DIAGNOSIS — Z1231 Encounter for screening mammogram for malignant neoplasm of breast: Secondary | ICD-10-CM

## 2018-06-23 ENCOUNTER — Ambulatory Visit
Admission: RE | Admit: 2018-06-23 | Discharge: 2018-06-23 | Disposition: A | Discharge status: Home / Self Care | Payer: Medicare Other | Source: Ambulatory Visit | Attending: Family Medicine | Admitting: Family Medicine

## 2018-06-23 DIAGNOSIS — Z1231 Encounter for screening mammogram for malignant neoplasm of breast: Secondary | ICD-10-CM

## 2018-08-18 ENCOUNTER — Other Ambulatory Visit: Payer: Self-pay | Admitting: Family Medicine

## 2018-09-08 DIAGNOSIS — Z6829 Body mass index (BMI) 29.0-29.9, adult: Secondary | ICD-10-CM | POA: Diagnosis not present

## 2018-09-08 DIAGNOSIS — M48062 Spinal stenosis, lumbar region with neurogenic claudication: Secondary | ICD-10-CM | POA: Diagnosis not present

## 2018-09-08 DIAGNOSIS — I10 Essential (primary) hypertension: Secondary | ICD-10-CM | POA: Diagnosis not present

## 2018-09-08 DIAGNOSIS — M5416 Radiculopathy, lumbar region: Secondary | ICD-10-CM | POA: Diagnosis not present

## 2018-09-08 DIAGNOSIS — M545 Low back pain: Secondary | ICD-10-CM | POA: Diagnosis not present

## 2018-10-11 ENCOUNTER — Other Ambulatory Visit: Payer: Self-pay | Admitting: Family Medicine

## 2018-10-14 DIAGNOSIS — R262 Difficulty in walking, not elsewhere classified: Secondary | ICD-10-CM | POA: Diagnosis not present

## 2018-10-14 DIAGNOSIS — M6281 Muscle weakness (generalized): Secondary | ICD-10-CM | POA: Diagnosis not present

## 2018-10-14 DIAGNOSIS — R293 Abnormal posture: Secondary | ICD-10-CM | POA: Diagnosis not present

## 2018-10-14 DIAGNOSIS — M545 Low back pain: Secondary | ICD-10-CM | POA: Diagnosis not present

## 2018-10-18 DIAGNOSIS — R293 Abnormal posture: Secondary | ICD-10-CM | POA: Diagnosis not present

## 2018-10-18 DIAGNOSIS — M6281 Muscle weakness (generalized): Secondary | ICD-10-CM | POA: Diagnosis not present

## 2018-10-18 DIAGNOSIS — R262 Difficulty in walking, not elsewhere classified: Secondary | ICD-10-CM | POA: Diagnosis not present

## 2018-10-18 DIAGNOSIS — M545 Low back pain: Secondary | ICD-10-CM | POA: Diagnosis not present

## 2018-10-20 DIAGNOSIS — M6281 Muscle weakness (generalized): Secondary | ICD-10-CM | POA: Diagnosis not present

## 2018-10-20 DIAGNOSIS — M545 Low back pain: Secondary | ICD-10-CM | POA: Diagnosis not present

## 2018-10-20 DIAGNOSIS — R262 Difficulty in walking, not elsewhere classified: Secondary | ICD-10-CM | POA: Diagnosis not present

## 2018-10-20 DIAGNOSIS — R293 Abnormal posture: Secondary | ICD-10-CM | POA: Diagnosis not present

## 2018-10-22 DIAGNOSIS — M545 Low back pain: Secondary | ICD-10-CM | POA: Diagnosis not present

## 2018-10-22 DIAGNOSIS — R262 Difficulty in walking, not elsewhere classified: Secondary | ICD-10-CM | POA: Diagnosis not present

## 2018-10-22 DIAGNOSIS — M6281 Muscle weakness (generalized): Secondary | ICD-10-CM | POA: Diagnosis not present

## 2018-10-22 DIAGNOSIS — R293 Abnormal posture: Secondary | ICD-10-CM | POA: Diagnosis not present

## 2018-10-25 DIAGNOSIS — R293 Abnormal posture: Secondary | ICD-10-CM | POA: Diagnosis not present

## 2018-10-25 DIAGNOSIS — M6281 Muscle weakness (generalized): Secondary | ICD-10-CM | POA: Diagnosis not present

## 2018-10-25 DIAGNOSIS — R262 Difficulty in walking, not elsewhere classified: Secondary | ICD-10-CM | POA: Diagnosis not present

## 2018-10-25 DIAGNOSIS — M545 Low back pain: Secondary | ICD-10-CM | POA: Diagnosis not present

## 2018-10-27 DIAGNOSIS — R262 Difficulty in walking, not elsewhere classified: Secondary | ICD-10-CM | POA: Diagnosis not present

## 2018-10-27 DIAGNOSIS — R293 Abnormal posture: Secondary | ICD-10-CM | POA: Diagnosis not present

## 2018-10-27 DIAGNOSIS — M6281 Muscle weakness (generalized): Secondary | ICD-10-CM | POA: Diagnosis not present

## 2018-10-27 DIAGNOSIS — M545 Low back pain: Secondary | ICD-10-CM | POA: Diagnosis not present

## 2018-10-29 DIAGNOSIS — R293 Abnormal posture: Secondary | ICD-10-CM | POA: Diagnosis not present

## 2018-10-29 DIAGNOSIS — R262 Difficulty in walking, not elsewhere classified: Secondary | ICD-10-CM | POA: Diagnosis not present

## 2018-10-29 DIAGNOSIS — M545 Low back pain: Secondary | ICD-10-CM | POA: Diagnosis not present

## 2018-10-29 DIAGNOSIS — M6281 Muscle weakness (generalized): Secondary | ICD-10-CM | POA: Diagnosis not present

## 2018-11-01 DIAGNOSIS — M545 Low back pain: Secondary | ICD-10-CM | POA: Diagnosis not present

## 2018-11-01 DIAGNOSIS — R262 Difficulty in walking, not elsewhere classified: Secondary | ICD-10-CM | POA: Diagnosis not present

## 2018-11-01 DIAGNOSIS — M6281 Muscle weakness (generalized): Secondary | ICD-10-CM | POA: Diagnosis not present

## 2018-11-01 DIAGNOSIS — R293 Abnormal posture: Secondary | ICD-10-CM | POA: Diagnosis not present

## 2018-11-03 DIAGNOSIS — M545 Low back pain: Secondary | ICD-10-CM | POA: Diagnosis not present

## 2018-11-03 DIAGNOSIS — R293 Abnormal posture: Secondary | ICD-10-CM | POA: Diagnosis not present

## 2018-11-03 DIAGNOSIS — R262 Difficulty in walking, not elsewhere classified: Secondary | ICD-10-CM | POA: Diagnosis not present

## 2018-11-03 DIAGNOSIS — M6281 Muscle weakness (generalized): Secondary | ICD-10-CM | POA: Diagnosis not present

## 2018-11-05 DIAGNOSIS — R262 Difficulty in walking, not elsewhere classified: Secondary | ICD-10-CM | POA: Diagnosis not present

## 2018-11-05 DIAGNOSIS — M6281 Muscle weakness (generalized): Secondary | ICD-10-CM | POA: Diagnosis not present

## 2018-11-05 DIAGNOSIS — M545 Low back pain: Secondary | ICD-10-CM | POA: Diagnosis not present

## 2018-11-05 DIAGNOSIS — R293 Abnormal posture: Secondary | ICD-10-CM | POA: Diagnosis not present

## 2018-11-10 DIAGNOSIS — R262 Difficulty in walking, not elsewhere classified: Secondary | ICD-10-CM | POA: Diagnosis not present

## 2018-11-10 DIAGNOSIS — M6281 Muscle weakness (generalized): Secondary | ICD-10-CM | POA: Diagnosis not present

## 2018-11-10 DIAGNOSIS — R293 Abnormal posture: Secondary | ICD-10-CM | POA: Diagnosis not present

## 2018-11-10 DIAGNOSIS — M545 Low back pain: Secondary | ICD-10-CM | POA: Diagnosis not present

## 2018-11-12 DIAGNOSIS — R293 Abnormal posture: Secondary | ICD-10-CM | POA: Diagnosis not present

## 2018-11-12 DIAGNOSIS — M545 Low back pain: Secondary | ICD-10-CM | POA: Diagnosis not present

## 2018-11-12 DIAGNOSIS — R262 Difficulty in walking, not elsewhere classified: Secondary | ICD-10-CM | POA: Diagnosis not present

## 2018-11-12 DIAGNOSIS — M6281 Muscle weakness (generalized): Secondary | ICD-10-CM | POA: Diagnosis not present

## 2018-11-15 DIAGNOSIS — R293 Abnormal posture: Secondary | ICD-10-CM | POA: Diagnosis not present

## 2018-11-15 DIAGNOSIS — M545 Low back pain: Secondary | ICD-10-CM | POA: Diagnosis not present

## 2018-11-15 DIAGNOSIS — R262 Difficulty in walking, not elsewhere classified: Secondary | ICD-10-CM | POA: Diagnosis not present

## 2018-11-15 DIAGNOSIS — M6281 Muscle weakness (generalized): Secondary | ICD-10-CM | POA: Diagnosis not present

## 2018-11-16 ENCOUNTER — Other Ambulatory Visit: Payer: Self-pay | Admitting: Family Medicine

## 2018-11-17 DIAGNOSIS — M545 Low back pain: Secondary | ICD-10-CM | POA: Diagnosis not present

## 2018-11-17 DIAGNOSIS — R262 Difficulty in walking, not elsewhere classified: Secondary | ICD-10-CM | POA: Diagnosis not present

## 2018-11-17 DIAGNOSIS — M6281 Muscle weakness (generalized): Secondary | ICD-10-CM | POA: Diagnosis not present

## 2018-11-17 DIAGNOSIS — R293 Abnormal posture: Secondary | ICD-10-CM | POA: Diagnosis not present

## 2018-11-22 DIAGNOSIS — H5202 Hypermetropia, left eye: Secondary | ICD-10-CM | POA: Diagnosis not present

## 2018-11-22 DIAGNOSIS — D3131 Benign neoplasm of right choroid: Secondary | ICD-10-CM | POA: Diagnosis not present

## 2018-11-22 DIAGNOSIS — H2513 Age-related nuclear cataract, bilateral: Secondary | ICD-10-CM | POA: Diagnosis not present

## 2018-11-22 DIAGNOSIS — H52201 Unspecified astigmatism, right eye: Secondary | ICD-10-CM | POA: Diagnosis not present

## 2018-11-22 DIAGNOSIS — H5211 Myopia, right eye: Secondary | ICD-10-CM | POA: Diagnosis not present

## 2018-11-22 DIAGNOSIS — H524 Presbyopia: Secondary | ICD-10-CM | POA: Diagnosis not present

## 2018-11-23 DIAGNOSIS — R262 Difficulty in walking, not elsewhere classified: Secondary | ICD-10-CM | POA: Diagnosis not present

## 2018-11-23 DIAGNOSIS — M545 Low back pain: Secondary | ICD-10-CM | POA: Diagnosis not present

## 2018-11-23 DIAGNOSIS — M6281 Muscle weakness (generalized): Secondary | ICD-10-CM | POA: Diagnosis not present

## 2018-11-23 DIAGNOSIS — R293 Abnormal posture: Secondary | ICD-10-CM | POA: Diagnosis not present

## 2018-11-24 NOTE — Progress Notes (Signed)
[facility] Note  11/26/2018     CHIEF COMPLAINT Patient presents for Retina Evaluation   HISTORY OF PRESENT ILLNESS: Annette Rios is a 63 y.o. female who presents to the clinic today for:   HPI    Retina Evaluation    In right eye.  This started 4 days ago.  Duration of 4 days.  Context:  distance vision, mid-range vision and near vision.  Treatments tried include no treatments.  I, the attending physician,  performed the HPI with the patient and updated documentation appropriately.          Comments    63 y/o female pt referred by Dr. Wyatt Portela for eval of choroidal nevus OD.  Saw Dr. Zenia Resides 4 days ago.  VA good OU cc.  Denies pain, flashes, floaters.  No gtts.       Last edited by Bernarda Caffey, MD on 11/26/2018 12:58 PM. (History)    pt states she saw Dr. Katy Fitch recently for routine eye exam bc she had not had one in 2 years, pt states her vision has been blurry for about 2 years, she states Dr. Katy Fitch said he saw a mole in her right eye  Referring physician: Debbra Riding, MD [address] [place], [place] [address]  HISTORICAL INFORMATION:   Selected notes from the MEDICAL RECORD NUMBER Referred by Dr. Wyatt Portela for concern of a nevus OD LEE: (S. Groat) [BCVA: OD: OS:] Ocular Hx-cataract OD PMH-arthritis, borderline diabetic, headaches, heart murmur, HLD, HTN    CURRENT MEDICATIONS: No current outpatient medications on file. (Ophthalmic Drugs)   No current facility-administered medications for this visit.  (Ophthalmic Drugs)   Current Outpatient Medications (Other)  Medication Sig  . amLODipine (NORVASC) 5 MG tablet Take 1 tablet (5 mg total) by mouth daily.  Marland Kitchen aspirin 81 MG tablet Take 81 mg by mouth daily.  Marland Kitchen atorvastatin (LIPITOR) 80 MG tablet TAKE 1 TABLET(80 MG) BY MOUTH DAILY  . cyclobenzaprine (FLEXERIL) 10 MG tablet Take 1 tablet (10 mg total) by mouth daily as needed. (Patient taking differently: Take 10 mg  by mouth daily as needed for muscle spasms. )  . esomeprazole (NEXIUM) 40 MG capsule Take 1 capsule (40 mg total) by mouth daily as needed (for acid reflux).  . Omega-3 Fatty Acids (FISH OIL PO) Take 1 capsule by mouth daily.  . traMADol (ULTRAM) 50 MG tablet TAKE 2 TABLET BY MOUTH TWICE DAILY AS NEEDED FOR PAIN   No current facility-administered medications for this visit.  (Other)      REVIEW OF SYSTEMS: ROS    Positive for: Gastrointestinal, Musculoskeletal, Eyes   Negative for: Constitutional, Neurological, Skin, Genitourinary, HENT, Endocrine, Cardiovascular, Respiratory, Psychiatric, Allergic/Imm, Heme/Lymph   Last edited by Matthew Folks, COA on 11/26/2018 10:18 AM. (History)       ALLERGIES Allergies  Allergen Reactions  . Penicillins Rash and Other (See Comments)    PATIENT HAS HAD A PCN REACTION WITH IMMEDIATE RASH, FACIAL/TONGUE/THROAT SWELLING, SOB, OR LIGHTHEADEDNESS WITH HYPOTENSION:  #  #  #  YES  #  #  #   Has patient had a PCN reaction causing severe rash involving mucus membranes or skin necrosis: NO Has patient had a PCN reaction that required hospitalization NO Has patient had a PCN reaction occurring within the last 10 years: NO If all of the above answers are "NO", then may proceed with Cephalosporin use.    PAST  MEDICAL HISTORY Past Medical History:  Diagnosis Date  . Arthritis    "in my back" (05/28/2016)  . Borderline diabetes   . Cataract    RIGHT EYE  . Chronic lower back pain   . Claustrophobia   . GERD (gastroesophageal reflux disease)   . Headache    "monthly" (05/28/2016)  . Heart murmur   . High cholesterol   . Hypertension   . Pre-diabetes    BORDERLINE....WATCHES WHAT SHE EATS   Past Surgical History:  Procedure Laterality Date  . BREAST BIOPSY Left   . BREAST CYST EXCISION Left   . BREAST CYST EXCISION Right   . BREAST EXCISIONAL BIOPSY Left    benign  . CARPAL TUNNEL RELEASE Bilateral 02/2009 - 2016   right-left  . COLON  SURGERY  05/2007   Archie Endo 02/05/2011  . COLONOSCOPY W/ BIOPSIES AND POLYPECTOMY    . I&D EXTREMITY Right 05/28/2016   "hand"  . I&D EXTREMITY Right 05/28/2016   Procedure: IRRIGATION AND DEBRIDEMENT EXTREMITY;  Surgeon: Charlotte Crumb, MD;  Location: [facility];  Service: Orthopedics;  Laterality: Right;  . LUMBAR FUSION  11/20/2017  . VAGINAL HYSTERECTOMY     "had fibroids"    FAMILY HISTORY History reviewed. No pertinent family history.  SOCIAL HISTORY Social History   Tobacco Use  . Smoking status: Never Smoker  . Smokeless tobacco: Never Used  Substance Use Topics  . Alcohol use: No  . Drug use: No         OPHTHALMIC EXAM:  Base Eye Exam    Visual Acuity (Snellen - Linear)      Right Left   Dist cc 20/15 -2 20/25 +2   Dist ph cc  20/20 -2   Correction:  Glasses       Tonometry (Tonopen, 10:19 AM)      Right Left   Pressure 23 18       Pupils      Dark Light Shape React APD   Right 4 3 Round Brisk None   Left 4 3 Round Brisk None       Visual Fields (Counting fingers)      Left Right    Full Full       Extraocular Movement      Right Left    Full, Ortho Full, Ortho       Neuro/Psych    Oriented x3:  Yes   Mood/Affect:  Normal       Dilation    Both eyes:  1.0% Mydriacyl, 2.5% Phenylephrine @ 10:19 AM        Slit Lamp and Fundus Exam    Slit Lamp Exam      Right Left   Lids/Lashes Dermatochalasis - upper lid Dermatochalasis - upper lid   Conjunctiva/Sclera mild Melanosis mild Melanosis   Cornea Arcus Arcus, 1+ Punctate epithelial erosions   Anterior Chamber Deep and quiet Deep and quiet   Iris Round and dilated Round and dilated   Lens 1+ Nuclear sclerosis, 2-3+ Cortical cataract 1+ Nuclear sclerosis, 2-3+ Cortical cataract   Vitreous Vitreous syneresis Vitreous syneresis       Fundus Exam      Right Left   Disc Pink and Sharp Pink and Sharp, milt tilt   C/D Ratio 0.4 0.4   Macula Good foveal reflex, Retinal pigment epithelial  mottling, No heme or edema Good foveal reflex, Retinal pigment epithelial mottling, No heme or edema   Vessels Vascular attenuation, Tortuousity Vascular attenuation, Tortuousity  Periphery Attached, oval shaped pigmented lesion at 1030 nevus v CHRPE Attached, shallow schisis with inner retinal holes at 0400          Refraction    Wearing Rx      Sphere Cylinder Axis Add   Right -0.50 +0.50 150 +2.25   Left +0.25 Sphere  +2.25   Age:  89yrs   Type:  Bifocal       Manifest Refraction      Sphere Cylinder Axis Dist VA   Right -0.50 +0.50 150 20/15-2   Left +0.25 +0.25 040 20/20-2          IMAGING AND PROCEDURES  Imaging and Procedures for @TODAY @  OCT, Retina - OU - Both Eyes       Right Eye Quality was good. Central Foveal Thickness: 278. Progression has no prior data. Findings include normal foveal contour, no SRF, no IRF, vitreomacular adhesion .   Left Eye Quality was good. Central Foveal Thickness: 280. Progression has no prior data. Findings include normal foveal contour, no SRF, no IRF, vitreomacular adhesion .   Notes *Images captured and stored on drive  Diagnosis / Impression:  NFP, no IRF/SRF OU VMA OU   Clinical management:  See below  Abbreviations: NFP - Normal foveal profile. CME - cystoid macular edema. PED - pigment epithelial detachment. IRF - intraretinal fluid. SRF - subretinal fluid. EZ - ellipsoid zone. ERM - epiretinal membrane. ORA - outer retinal atrophy. ORT - outer retinal tubulation. SRHM - subretinal hyper-reflective material        Color Fundus Photography Optos - OU - Both Eyes       Right Eye Disc findings include normal observations. Macula : normal observations. Vessels : tortuous vessels, attenuated. Periphery : RPE abnormality (CHRPE at 1030).   Left Eye Disc findings include normal observations. Macula : normal observations. Vessels : tortuous vessels, attenuated. Periphery : (Retinoschisis w/ inner retinal holes at  0400).   Notes **Images stored on drive**  Impression: OD:  Oval CHRPE at 1030 OS:  Retinoschisis w/ inner retinal holes at 0400                 ASSESSMENT/PLAN:    ICD-10-CM   1. 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 Left retinoschisis H33.102 Color Fundus Photography Optos - OU - Both Eyes  3. Retinal edema H35.81 OCT, Retina - OU - Both Eyes  4. Essential hypertension I10   5. Hypertensive retinopathy of both eyes H35.033   6. Combined forms of age-related cataract of both eyes H25.813     1. CHRPE OD  - flat, well-demarcated, pigmented lesion located at 1030  - no SRF  - differential includes nevus  - baseline optos images obtained today  - monitor for now  - f/u 3 months  2. Retinoschisis OS  - shallow schisis located at 0400 with inner retinal holes  - no RD  - monitor  - f/u 3 mos  3. No retinal edema on exam or OCT  4,5. Hypertensive retinopathy OU  - discussed importance of tight BP control  - monitor  6. Mixed form age related cataracts  - The symptoms of cataract, surgical options, and treatments and risks were discussed with patient.  - discussed diagnosis and progression  - not yet visually significant  - monitor for now   Ophthalmic Meds Ordered this visit:  No orders of the defined types were placed  in this encounter.      Return in about 3 months (around 02/24/2019) for f/u nevus OD, DFE, OCT.  There are no Patient Instructions on file for this visit.   Explained the diagnoses, plan, and follow up with the patient and they expressed understanding.  Patient expressed understanding of the importance of proper follow up care.   This document serves as a record of services personally performed by Gardiner Sleeper, MD, PhD. It was created on their behalf by Ernest Mallick, OA, an ophthalmic assistant. The creation of this record is the provider's dictation and/or  activities during the visit.    Electronically signed by: Ernest Mallick, OA  02.19.2020 1:16 PM    Gardiner Sleeper, M.D., Ph.D. Diseases & Surgery of the Retina and Vitreous [facility]  I have reviewed the above documentation for accuracy and completeness, and I agree with the above. Gardiner Sleeper, M.D., Ph.D. 11/26/18 1:21 PM    Abbreviations: M myopia (nearsighted); A astigmatism; H hyperopia (farsighted); P presbyopia; Mrx spectacle prescription;  CTL contact lenses; OD right eye; OS left eye; OU both eyes  XT exotropia; ET esotropia; PEK punctate epithelial keratitis; PEE punctate epithelial erosions; DES dry eye syndrome; MGD meibomian gland dysfunction; ATs artificial tears; PFAT's preservative free artificial tears; [place] nuclear sclerotic cataract; PSC posterior subcapsular cataract; ERM epi-retinal membrane; PVD posterior vitreous detachment; RD retinal detachment; DM diabetes mellitus; DR diabetic retinopathy; NPDR non-proliferative diabetic retinopathy; PDR proliferative diabetic retinopathy; CSME clinically significant macular edema; DME diabetic macular edema; dbh dot blot hemorrhages; CWS cotton wool spot; POAG primary open angle glaucoma; C/D cup-to-disc ratio; HVF humphrey visual field; GVF goldmann visual field; OCT optical coherence tomography; IOP intraocular pressure; BRVO Branch retinal vein occlusion; CRVO central retinal vein occlusion; CRAO central retinal artery occlusion; BRAO branch retinal artery occlusion; RT retinal tear; SB scleral buckle; PPV pars plana vitrectomy; VH Vitreous hemorrhage; PRP panretinal laser photocoagulation; IVK intravitreal kenalog; VMT vitreomacular traction; MH Macular hole;  NVD neovascularization of the disc; NVE neovascularization elsewhere; AREDS age related eye disease study; ARMD age related macular degeneration; POAG primary open angle glaucoma; EBMD epithelial/anterior basement membrane dystrophy; ACIOL anterior  chamber intraocular lens; IOL intraocular lens; PCIOL posterior chamber intraocular lens; Phaco/IOL phacoemulsification with intraocular lens placement; [place] photorefractive keratectomy; LASIK laser assisted in situ keratomileusis; HTN hypertension; DM diabetes mellitus; COPD chronic obstructive pulmonary disease

## 2018-11-25 DIAGNOSIS — R293 Abnormal posture: Secondary | ICD-10-CM | POA: Diagnosis not present

## 2018-11-25 DIAGNOSIS — M6281 Muscle weakness (generalized): Secondary | ICD-10-CM | POA: Diagnosis not present

## 2018-11-25 DIAGNOSIS — R262 Difficulty in walking, not elsewhere classified: Secondary | ICD-10-CM | POA: Diagnosis not present

## 2018-11-25 DIAGNOSIS — M545 Low back pain: Secondary | ICD-10-CM | POA: Diagnosis not present

## 2018-11-26 ENCOUNTER — Ambulatory Visit (INDEPENDENT_AMBULATORY_CARE_PROVIDER_SITE_OTHER): Payer: Medicare Other | Admitting: Ophthalmology

## 2018-11-26 ENCOUNTER — Encounter (INDEPENDENT_AMBULATORY_CARE_PROVIDER_SITE_OTHER): Payer: Self-pay | Admitting: Ophthalmology

## 2018-11-26 DIAGNOSIS — H35033 Hypertensive retinopathy, bilateral: Secondary | ICD-10-CM | POA: Diagnosis not present

## 2018-11-26 DIAGNOSIS — Q141 Congenital malformation of retina: Secondary | ICD-10-CM | POA: Diagnosis not present

## 2018-11-26 DIAGNOSIS — I10 Essential (primary) hypertension: Secondary | ICD-10-CM | POA: Diagnosis not present

## 2018-11-26 DIAGNOSIS — H3581 Retinal edema: Secondary | ICD-10-CM

## 2018-11-26 DIAGNOSIS — H33102 Unspecified retinoschisis, left eye: Secondary | ICD-10-CM

## 2018-11-26 DIAGNOSIS — H25813 Combined forms of age-related cataract, bilateral: Secondary | ICD-10-CM | POA: Diagnosis not present

## 2018-11-29 DIAGNOSIS — M545 Low back pain: Secondary | ICD-10-CM | POA: Diagnosis not present

## 2018-11-29 DIAGNOSIS — R293 Abnormal posture: Secondary | ICD-10-CM | POA: Diagnosis not present

## 2018-11-29 DIAGNOSIS — M6281 Muscle weakness (generalized): Secondary | ICD-10-CM | POA: Diagnosis not present

## 2018-11-29 DIAGNOSIS — R262 Difficulty in walking, not elsewhere classified: Secondary | ICD-10-CM | POA: Diagnosis not present

## 2018-11-30 ENCOUNTER — Ambulatory Visit (INDEPENDENT_AMBULATORY_CARE_PROVIDER_SITE_OTHER): Payer: Medicare Other | Admitting: Family Medicine

## 2018-11-30 ENCOUNTER — Other Ambulatory Visit: Payer: Self-pay

## 2018-11-30 ENCOUNTER — Ambulatory Visit (HOSPITAL_COMMUNITY)
Admission: RE | Admit: 2018-11-30 | Discharge: 2018-11-30 | Disposition: A | Discharge status: Home / Self Care | Payer: Medicare Other | Source: Ambulatory Visit | Attending: Family Medicine | Admitting: Family Medicine

## 2018-11-30 ENCOUNTER — Encounter: Payer: Self-pay | Admitting: Family Medicine

## 2018-11-30 VITALS — BP 138/76 | HR 66 | Temp 98.1°F | Ht 68.0 in | Wt 199.0 lb

## 2018-11-30 DIAGNOSIS — R002 Palpitations: Secondary | ICD-10-CM | POA: Diagnosis not present

## 2018-11-30 DIAGNOSIS — M545 Low back pain: Secondary | ICD-10-CM | POA: Diagnosis not present

## 2018-11-30 DIAGNOSIS — I10 Essential (primary) hypertension: Secondary | ICD-10-CM | POA: Diagnosis not present

## 2018-11-30 DIAGNOSIS — G8929 Other chronic pain: Secondary | ICD-10-CM | POA: Diagnosis not present

## 2018-11-30 DIAGNOSIS — E7800 Pure hypercholesterolemia, unspecified: Secondary | ICD-10-CM | POA: Diagnosis not present

## 2018-11-30 MED ORDER — AMITRIPTYLINE HCL 10 MG PO TABS: ORAL_TABLET | ORAL | 3 refills | Status: DC

## 2018-11-30 NOTE — Patient Instructions (Signed)
I will send you a note about your labs Great to see you!  Call back and let me know what the name of that prescription creme was and I will call some in for you.

## 2018-12-01 ENCOUNTER — Encounter: Payer: Self-pay | Admitting: Family Medicine

## 2018-12-01 DIAGNOSIS — M545 Low back pain: Secondary | ICD-10-CM | POA: Diagnosis not present

## 2018-12-01 DIAGNOSIS — M6281 Muscle weakness (generalized): Secondary | ICD-10-CM | POA: Diagnosis not present

## 2018-12-01 DIAGNOSIS — R293 Abnormal posture: Secondary | ICD-10-CM | POA: Diagnosis not present

## 2018-12-01 DIAGNOSIS — R262 Difficulty in walking, not elsewhere classified: Secondary | ICD-10-CM | POA: Diagnosis not present

## 2018-12-01 LAB — CMP14+EGFR
ALBUMIN: 4.5 g/dL (ref 3.8–4.8)
ALT: 40 IU/L — ABNORMAL HIGH (ref 0–32)
AST: 24 IU/L (ref 0–40)
Albumin/Globulin Ratio: 1.7 (ref 1.2–2.2)
Alkaline Phosphatase: 54 IU/L (ref 39–117)
BUN/Creatinine Ratio: 14 (ref 12–28)
BUN: 11 mg/dL (ref 8–27)
Bilirubin Total: 0.7 mg/dL (ref 0.0–1.2)
CHLORIDE: 104 mmol/L (ref 96–106)
CO2: 26 mmol/L (ref 20–29)
Calcium: 9.7 mg/dL (ref 8.7–10.3)
Creatinine, Ser: 0.78 mg/dL (ref 0.57–1.00)
GFR calc Af Amer: 94 mL/min/{1.73_m2} (ref >59)
GFR calc non Af Amer: 81 mL/min/{1.73_m2} (ref >59)
Globulin, Total: 2.6 g/dL (ref 1.5–4.5)
Glucose: 131 mg/dL — ABNORMAL HIGH (ref 65–99)
Potassium: 4.7 mmol/L (ref 3.5–5.2)
Sodium: 143 mmol/L (ref 134–144)
Total Protein: 7.1 g/dL (ref 6.0–8.5)

## 2018-12-01 LAB — LDL CHOLESTEROL, DIRECT: LDL Direct: 51 mg/dL (ref 0–99)

## 2018-12-02 ENCOUNTER — Telehealth: Payer: Self-pay | Admitting: *Deleted

## 2018-12-02 NOTE — Telephone Encounter (Signed)
Pt calls because she could not remember the name of the medication for her pain at last visit. She reports that it was voltaren gel and she would like a refill  Maurya Nethery, [name], [place]

## 2018-12-03 DIAGNOSIS — R293 Abnormal posture: Secondary | ICD-10-CM | POA: Diagnosis not present

## 2018-12-03 DIAGNOSIS — R262 Difficulty in walking, not elsewhere classified: Secondary | ICD-10-CM | POA: Diagnosis not present

## 2018-12-03 DIAGNOSIS — M6281 Muscle weakness (generalized): Secondary | ICD-10-CM | POA: Diagnosis not present

## 2018-12-03 DIAGNOSIS — M545 Low back pain: Secondary | ICD-10-CM | POA: Diagnosis not present

## 2018-12-03 MED ORDER — DICLOFENAC SODIUM 1 % TD GEL: 2.0000 g | Freq: Four times a day (QID) | TRANSDERMAL | 5 refills | Status: DC

## 2018-12-03 NOTE — Assessment & Plan Note (Signed)
Check her LDL today.  She is not having any problems with her medicines and seems to be doing well so we will not change her medication regimen at this time.

## 2018-12-03 NOTE — Assessment & Plan Note (Signed)
Doing well on current medication.  We will check some labs and continue.  Refills given.

## 2018-12-03 NOTE — Assessment & Plan Note (Signed)
I encouraged her to participate in her intensive outpatient physical therapy program.  I know she does not want to have anymore surgery.  She seems to be improving.  Using her tramadol only intermittently, much less than she needed to before with the surgery.

## 2018-12-03 NOTE — Progress Notes (Signed)
    CHIEF COMPLAINT / HPI: 1.  Hypertension: Doing well.  No chest pain, no shortness of breath, no headaches.  Every time she has been to different doctors office, her blood pressure has been controlled and she is very happy about that.  Needs some refills. #2.  Has been taking her cholesterol medicine regularly.  No problems specifically no myalgias or arthralgias.  Not really following a very strict diet for lipid management.  Has not been exercising regularly due to her back issues. #3.  Had surgery on her back and that is improved and her pain but has had trouble getting back to baseline activities.  Her neurosurgeon put her back in intensive physical therapy.  She is quite hopeful this will help.  REVIEW OF SYSTEMS: No chest pain, no shortness of breath, no lower extremity edema.  No headaches.  She has had improvement in her back pain.  No lower extremity weakness, no incontinence of bowel or bladder.  She does continue to have low back and some mild radicular pain.  No fever, no unusual weight change.  PERTINENT  PMH / PSH: I have reviewed the patient's medications, allergies, past medical and surgical history, smoking status and updated in the EMR as appropriate.   OBJECTIVE: Vital signs reviewed. GENERAL: Well-developed, well-nourished, no acute distress. CARDIOVASCULAR: Regular rate and rhythm no murmur gallop or rub I heard a few extra beats during auscultation so we did an EKG which shows no change since prior EKG 2 years ago.  On both of them she had some premature atrial contractions.  This obviously accounts for the mildly abnormal clinical exam. LUNGS: Clear to auscultation bilaterally, no rales or wheeze. ABDOMEN: Soft positive bowel sounds NEURO: No gross focal neurological deficits. MSK: Movement of extremity x 4.     ASSESSMENT / PLAN:  No problem-specific Assessment & Plan notes found for this encounter.

## 2018-12-08 DIAGNOSIS — M6281 Muscle weakness (generalized): Secondary | ICD-10-CM | POA: Diagnosis not present

## 2018-12-08 DIAGNOSIS — M545 Low back pain: Secondary | ICD-10-CM | POA: Diagnosis not present

## 2018-12-08 DIAGNOSIS — R262 Difficulty in walking, not elsewhere classified: Secondary | ICD-10-CM | POA: Diagnosis not present

## 2018-12-08 DIAGNOSIS — R293 Abnormal posture: Secondary | ICD-10-CM | POA: Diagnosis not present

## 2018-12-10 DIAGNOSIS — R262 Difficulty in walking, not elsewhere classified: Secondary | ICD-10-CM | POA: Diagnosis not present

## 2018-12-10 DIAGNOSIS — R293 Abnormal posture: Secondary | ICD-10-CM | POA: Diagnosis not present

## 2018-12-10 DIAGNOSIS — M6281 Muscle weakness (generalized): Secondary | ICD-10-CM | POA: Diagnosis not present

## 2018-12-10 DIAGNOSIS — M545 Low back pain: Secondary | ICD-10-CM | POA: Diagnosis not present

## 2018-12-30 ENCOUNTER — Other Ambulatory Visit: Payer: Self-pay | Admitting: Family Medicine

## 2018-12-30 NOTE — Telephone Encounter (Signed)
Patient called for a refill on medication but patient didn't remember the name of it. Patient stated that the "cream" is for poison ivy. Please call patient back.

## 2019-01-04 MED ORDER — TRIAMCINOLONE ACETONIDE 0.1 % EX CREA: 1.0000 "application " | TOPICAL_CREAM | Freq: Two times a day (BID) | CUTANEOUS | 0 refills | Status: DC

## 2019-01-04 NOTE — Telephone Encounter (Signed)
Attempted to call pt but couldn't leave a voicemail. Will try again later. Deseree Kennon Holter, CMA

## 2019-01-04 NOTE — Telephone Encounter (Signed)
Dear [name] Team Please let her know I sent it in [facility]! Dorcas Mcmurray

## 2019-01-10 ENCOUNTER — Other Ambulatory Visit: Payer: Self-pay | Admitting: Family Medicine

## 2019-02-15 ENCOUNTER — Other Ambulatory Visit: Payer: Self-pay | Admitting: Family Medicine

## 2019-02-24 ENCOUNTER — Encounter (INDEPENDENT_AMBULATORY_CARE_PROVIDER_SITE_OTHER): Payer: Medicare Other | Admitting: Ophthalmology

## 2019-05-15 ENCOUNTER — Other Ambulatory Visit: Payer: Self-pay | Admitting: Family Medicine

## 2019-05-20 ENCOUNTER — Other Ambulatory Visit: Payer: Self-pay | Admitting: Family Medicine

## 2019-06-01 ENCOUNTER — Other Ambulatory Visit: Payer: Self-pay

## 2019-06-01 ENCOUNTER — Telehealth (INDEPENDENT_AMBULATORY_CARE_PROVIDER_SITE_OTHER): Payer: Medicare HMO | Admitting: Family Medicine

## 2019-06-01 DIAGNOSIS — M25473 Effusion, unspecified ankle: Secondary | ICD-10-CM | POA: Diagnosis not present

## 2019-06-02 ENCOUNTER — Other Ambulatory Visit: Payer: Self-pay | Admitting: Family Medicine

## 2019-06-02 DIAGNOSIS — Z1231 Encounter for screening mammogram for malignant neoplasm of breast: Secondary | ICD-10-CM

## 2019-06-02 NOTE — Progress Notes (Signed)
[facility] Telemedicine Visit  Patient consented to have visit conducted via telephone/video phone call. Two separate patient identifiers used to verify identity.  Encounter participants: Patient: Annette Rios  Provider: Dorcas Mcmurray  Others (if applicable): none  Chief Complaint / HPI:  Swelling of her ankle.  She has a little swelling on both sides but 1 of her ankle swells more than the other.  This concerns her.  She is also noticed that if she leans on her leg with her elbow for quite a while, she will have a small indentation into the thigh area.  This goes away usually less than a minute.  She does not notice any tightness of her shoes, no difficulty wearing them.  Her feet do not hurt.  She has had no shortness of breath with exercise or at rest.  She continues to sleep on 1-2 pillows which is her baseline long-term.  She is not had any episodes where she has to wake up at night short of breath.  She feels her weight has been relatively stable.  She has not been checking her blood pressure.  ROS: No fever No productive cough.  No chest pain.  No unusual weight change No abdominal pain  OBJECTIVE observed via telephone device: PSYCH: AxOx4.  Appropriate speech fluency and content. Asks and answers questions appropriately. Mood is congruent. RESPIRATORY: no unusual sounds of labored breathing    Pertinent PMHx / PSHx:  I have reviewed the patient's medications, allergies, past medical and surgical history, smoking status and updated in the EMR as appropriate.   Assessment/Plan:  Mild lower extremity edema We discussed at length.  She has no other signs of fluid overload.  I told her what to watch out for and certainly if she has any new red flags or symptoms she will let me know.  She is doing back in the next 1 to 2 months for regular checkup where we will also recheck her labs.  She is on amlodipine 5 mg but has been on that long-term.  If she notes  increasing problems with edema or swelling, she will let me know.  I think she was mostly worried because 1 ankle was bigger than the other 1.  Time spent on phone or on video call with patient: 18 minutes

## 2019-06-22 ENCOUNTER — Encounter: Payer: Self-pay | Admitting: Family Medicine

## 2019-06-22 ENCOUNTER — Ambulatory Visit (INDEPENDENT_AMBULATORY_CARE_PROVIDER_SITE_OTHER): Payer: Medicare HMO | Admitting: Family Medicine

## 2019-06-22 ENCOUNTER — Other Ambulatory Visit: Payer: Self-pay

## 2019-06-22 VITALS — BP 136/78 | HR 66 | Wt 193.4 lb

## 2019-06-22 DIAGNOSIS — R609 Edema, unspecified: Secondary | ICD-10-CM | POA: Diagnosis not present

## 2019-06-22 DIAGNOSIS — I10 Essential (primary) hypertension: Secondary | ICD-10-CM | POA: Diagnosis not present

## 2019-06-22 DIAGNOSIS — E7800 Pure hypercholesterolemia, unspecified: Secondary | ICD-10-CM | POA: Diagnosis not present

## 2019-06-22 MED ORDER — INDAPAMIDE 1.25 MG PO TABS: 1.2500 mg | ORAL_TABLET | Freq: Every day | ORAL | 3 refills | Status: DC

## 2019-06-22 MED ORDER — DICLOFENAC SODIUM 1 % TD GEL: 2.0000 g | Freq: Four times a day (QID) | TRANSDERMAL | 5 refills | Status: DC

## 2019-06-22 NOTE — Patient Instructions (Addendum)
Let's start adding a single pill of indapamide daily. Stay on your other meds the same. I also sent in a refill on the voltaren gel Come back and see me in about a month!  Great to see you!

## 2019-06-23 ENCOUNTER — Telehealth: Payer: Self-pay | Admitting: *Deleted

## 2019-06-23 LAB — BASIC METABOLIC PANEL
BUN/Creatinine Ratio: 15 (ref 12–28)
BUN: 9 mg/dL (ref 8–27)
CO2: 29 mmol/L (ref 20–29)
Calcium: 9.5 mg/dL (ref 8.7–10.3)
Chloride: 102 mmol/L (ref 96–106)
Creatinine, Ser: 0.62 mg/dL (ref 0.57–1.00)
GFR calc Af Amer: 110 mL/min/{1.73_m2} (ref >59)
GFR calc non Af Amer: 96 mL/min/{1.73_m2} (ref >59)
Glucose: 148 mg/dL — ABNORMAL HIGH (ref 65–99)
Potassium: 4.2 mmol/L (ref 3.5–5.2)
Sodium: 143 mmol/L (ref 134–144)

## 2019-06-23 NOTE — Telephone Encounter (Signed)
Med approved.  Pharmacy informed. Christen Bame, CMA

## 2019-06-23 NOTE — Telephone Encounter (Signed)
Received fax from pharmacy, PA needed on diclofenac.  Clinical questions submitted via Cover My Meds.  Waiting on response, could take up to 72 hours.  Cover My Meds info: Key: [id number]  Christen Bame, CMA

## 2019-06-24 DIAGNOSIS — R609 Edema, unspecified: Secondary | ICD-10-CM | POA: Insufficient documentation

## 2019-06-24 NOTE — Progress Notes (Signed)
    CHIEF COMPLAINT / HPI: Lower extremity swelling.  Occurs most days.  Goes down and almost resolves overnight.  No shortness of breath.  Normal urination.  She does worry a little bit because the left ankle seems to swell more than the right.  No calf pain. #2.  Hypertension: Taking her blood pressure medicine regularly without problem.  No chest pain. 3.  Hypercholesterolemia: Taking her medication regularly without any problems.  REVIEW OF SYSTEMS: See HPI  PERTINENT  PMH / PSH: I have reviewed the patient's medications, allergies, past medical and surgical history, smoking status and updated in the EMR as appropriate.   OBJECTIVE:  Vital signs reviewed. GENERAL: Well-developed, well-nourished, no acute distress. CARDIOVASCULAR: Regular rate and rhythm no murmur gallop or rub LUNGS: Clear to auscultation bilaterally, no rales or wheeze. ABDOMEN: Soft positive bowel sounds NEURO: No gross focal neurological deficits. MSK: Movement of extremity x 4. EXTREMITY: Trace pitting edema lower extremity from about mid shin down.  Is a little more pronounced on the left but not terribly asymmetrical.  Bilaterally her calves are soft.  Negative Homans sign.   ASSESSMENT / PLAN:   Edema Will add small dose indapamide.  Follow-up 2 weeks for lab, 4 weeks for recheck.  We will also get blood work today.  HYPERTENSION, BENIGN SYSTEMIC Blood pressures doing well and I think she can tolerate adding a small dose diuretic but will keep an eye on this when I see her back for recheck.  HYPERCHOLESTEROLEMIA Continue current medications

## 2019-06-24 NOTE — Assessment & Plan Note (Signed)
Will add small dose indapamide.  Follow-up 2 weeks for lab, 4 weeks for recheck.  We will also get blood work today.

## 2019-06-24 NOTE — Assessment & Plan Note (Signed)
Continue current medications. 

## 2019-06-24 NOTE — Assessment & Plan Note (Signed)
Blood pressures doing well and I think she can tolerate adding a small dose diuretic but will keep an eye on this when I see her back for recheck.

## 2019-07-04 ENCOUNTER — Other Ambulatory Visit: Payer: Self-pay

## 2019-07-04 DIAGNOSIS — Z20822 Contact with and (suspected) exposure to covid-19: Secondary | ICD-10-CM

## 2019-07-04 DIAGNOSIS — R6889 Other general symptoms and signs: Secondary | ICD-10-CM | POA: Diagnosis not present

## 2019-07-05 ENCOUNTER — Encounter: Payer: Self-pay | Admitting: Family Medicine

## 2019-07-05 LAB — NOVEL CORONAVIRUS, NAA: SARS-CoV-2, NAA: NOT DETECTED

## 2019-07-07 ENCOUNTER — Telehealth: Payer: Self-pay

## 2019-07-07 NOTE — Telephone Encounter (Signed)
Pt. Called back and given COVID 19 results.

## 2019-07-15 ENCOUNTER — Other Ambulatory Visit: Payer: Self-pay

## 2019-07-15 ENCOUNTER — Ambulatory Visit
Admission: RE | Admit: 2019-07-15 | Discharge: 2019-07-15 | Disposition: A | Discharge status: Home / Self Care | Payer: Medicare HMO | Source: Ambulatory Visit | Attending: Family Medicine | Admitting: Family Medicine

## 2019-07-15 DIAGNOSIS — Z1231 Encounter for screening mammogram for malignant neoplasm of breast: Secondary | ICD-10-CM

## 2019-08-24 ENCOUNTER — Ambulatory Visit (INDEPENDENT_AMBULATORY_CARE_PROVIDER_SITE_OTHER): Payer: Medicare HMO | Admitting: Family Medicine

## 2019-08-24 ENCOUNTER — Encounter: Payer: Self-pay | Admitting: Family Medicine

## 2019-08-24 ENCOUNTER — Other Ambulatory Visit: Payer: Self-pay

## 2019-08-24 VITALS — BP 134/78 | HR 64 | Wt 193.6 lb

## 2019-08-24 DIAGNOSIS — E7800 Pure hypercholesterolemia, unspecified: Secondary | ICD-10-CM

## 2019-08-24 DIAGNOSIS — I10 Essential (primary) hypertension: Secondary | ICD-10-CM

## 2019-08-24 DIAGNOSIS — R609 Edema, unspecified: Secondary | ICD-10-CM

## 2019-08-24 NOTE — Patient Instructions (Signed)
Great to see you! Be safe!

## 2019-08-24 NOTE — Assessment & Plan Note (Signed)
No lower extremity edema which was somewhat asymmetrical with the left ankle swelling more has seemed to resolve with the addition of the indapamide.

## 2019-08-24 NOTE — Progress Notes (Signed)
    CHIEF COMPLAINT / HPI: #1.  Follow-up hypertension in some readjustment of her medicines.  She is feeling well.  Having less lower extremity edema.  No problems. 2.  Continues on her cholesterol medicine without problems. 3.  The ankle swelling she was having has resolved. 4.  She like to get back out exercising or go back to the [facility] but has questions about Covid.  REVIEW OF SYSTEMS: Denies chest pain, no shortness of breath.  No headaches.  See HPI.  PERTINENT  PMH / PSH: I have reviewed the patient's medications, allergies, past medical and surgical history, smoking status and updated in the EMR as appropriate.   OBJECTIVE:  Vital signs reviewed. GENERAL: Well-developed, well-nourished, no acute distress. CARDIOVASCULAR: Regular rate and rhythm no murmur gallop or rub LUNGS: Clear to auscultation bilaterally, no rales or wheeze. ABDOMEN: Soft positive bowel sounds NEURO: No gross focal neurological deficits. MSK: Movement of extremity x 4.   ASSESSMENT / PLAN:   No problem-specific Assessment & Plan notes found for this encounter.

## 2019-08-24 NOTE — Assessment & Plan Note (Signed)
Excellent blood pressure control on current medication.  Continue and will check creatinine and potassium today as we had recently started indapamide.

## 2019-08-24 NOTE — Assessment & Plan Note (Signed)
No problems with her medications.  We will continue current regimen and see her back in 6 months.

## 2019-08-25 LAB — BASIC METABOLIC PANEL
BUN/Creatinine Ratio: 19 (ref 12–28)
BUN: 15 mg/dL (ref 8–27)
CO2: 23 mmol/L (ref 20–29)
Calcium: 10.1 mg/dL (ref 8.7–10.3)
Chloride: 103 mmol/L (ref 96–106)
Creatinine, Ser: 0.78 mg/dL (ref 0.57–1.00)
GFR calc Af Amer: 93 mL/min/{1.73_m2} (ref >59)
GFR calc non Af Amer: 81 mL/min/{1.73_m2} (ref >59)
Glucose: 141 mg/dL — ABNORMAL HIGH (ref 65–99)
Potassium: 3.8 mmol/L (ref 3.5–5.2)
Sodium: 142 mmol/L (ref 134–144)

## 2019-08-26 ENCOUNTER — Encounter: Payer: Self-pay | Admitting: Family Medicine

## 2019-11-23 ENCOUNTER — Other Ambulatory Visit: Payer: Self-pay | Admitting: *Deleted

## 2019-11-24 MED ORDER — AMLODIPINE BESYLATE 5 MG PO TABS: 5.0000 mg | ORAL_TABLET | Freq: Every day | ORAL | 3 refills | Status: DC

## 2019-12-30 ENCOUNTER — Other Ambulatory Visit: Payer: Self-pay | Admitting: *Deleted

## 2019-12-30 MED ORDER — TRAMADOL HCL 50 MG PO TABS: ORAL_TABLET | ORAL | 4 refills | Status: DC

## 2020-01-03 ENCOUNTER — Other Ambulatory Visit: Payer: Self-pay | Admitting: *Deleted

## 2020-01-04 MED ORDER — AMITRIPTYLINE HCL 10 MG PO TABS: ORAL_TABLET | ORAL | 3 refills | Status: DC

## 2020-01-18 DIAGNOSIS — D3131 Benign neoplasm of right choroid: Secondary | ICD-10-CM | POA: Diagnosis not present

## 2020-01-18 DIAGNOSIS — H25813 Combined forms of age-related cataract, bilateral: Secondary | ICD-10-CM | POA: Diagnosis not present

## 2020-01-30 NOTE — Progress Notes (Signed)
[facility] Note  02/01/2020     CHIEF COMPLAINT Patient presents for Retina Follow Up   HISTORY OF PRESENT ILLNESS: Annette Rios is a 64 y.o. female who presents to the clinic today for:   HPI    Retina Follow Up    Patient presents with  Other.  In right eye.  This started 3 months ago.  Severity is moderate.  I, the attending physician,  performed the HPI with the patient and updated documentation appropriately.          Comments    Patient here for 3 month retina follow up for nevus OD. Patient states vision doing pretty good. OS has sharp pain like something sticking it. Sees on occasion black things in OS. Has cataracts OU. Has a mole behind OD to be checked.        Last edited by Bernarda Caffey, MD on 02/03/2020 12:41 AM. (History)    pt states vision is the same, pt states Dr. Katy Fitch has mentioned she is ready for cataract sx   Referring physician: Dickie La, MD [address] [place],  [place] [address]  HISTORICAL INFORMATION:   Selected notes from the MEDICAL RECORD NUMBER Referred by Dr. Wyatt Portela for concern of a nevus OD LEE: (S. Groat) [BCVA: OD: OS:] Ocular Hx-cataract OD PMH-arthritis, borderline diabetic, headaches, heart murmur, HLD, HTN    CURRENT MEDICATIONS: No current outpatient medications on file. (Ophthalmic Drugs)   No current facility-administered medications for this visit. (Ophthalmic Drugs)   Current Outpatient Medications (Other)  Medication Sig  . amitriptyline (ELAVIL) 10 MG tablet Take one by mouth at bedtime for muscle spasm may make you sleepy  . amLODipine (NORVASC) 5 MG tablet Take 1 tablet (5 mg total) by mouth daily.  Marland Kitchen atorvastatin (LIPITOR) 80 MG tablet TAKE 1 TABLET BY MOUTH EVERY DAY  . diclofenac sodium (VOLTAREN) 1 % GEL Apply 2 g topically 4 (four) times daily.  Marland Kitchen esomeprazole (NEXIUM) 40 MG capsule Take 1 capsule (40 mg total) by mouth daily as needed (for acid reflux).  .  indapamide (LOZOL) 1.25 MG tablet Take 1 tablet (1.25 mg total) by mouth daily.  . traMADol (ULTRAM) 50 MG tablet TAKE 2 TABLETS BY MOUTH TWICE DAILY AS NEEDED FOR PAIN   No current facility-administered medications for this visit. (Other)      REVIEW OF SYSTEMS: ROS    Positive for: Gastrointestinal, Musculoskeletal, Eyes   Negative for: Constitutional, Neurological, Skin, Genitourinary, HENT, Endocrine, Cardiovascular, Respiratory, Psychiatric, Allergic/Imm, Heme/Lymph   Last edited by Theodore Demark, COA on 02/01/2020  9:44 AM. (History)       ALLERGIES Allergies  Allergen Reactions  . Penicillins Rash and Other (See Comments)    PATIENT HAS HAD A PCN REACTION WITH IMMEDIATE RASH, FACIAL/TONGUE/THROAT SWELLING, SOB, OR LIGHTHEADEDNESS WITH HYPOTENSION:  #  #  #  YES  #  #  #   Has patient had a PCN reaction causing severe rash involving mucus membranes or skin necrosis: NO Has patient had a PCN reaction that required hospitalization NO Has patient had a PCN reaction occurring within the last 10 years: NO If all of the above answers are "NO", then may proceed with Cephalosporin use.    PAST MEDICAL HISTORY Past Medical History:  Diagnosis Date  . Arthritis    "in my back" (05/28/2016)  . Borderline diabetes   . Cataract    RIGHT EYE  . Chronic lower  back pain   . Claustrophobia   . GERD (gastroesophageal reflux disease)   . Headache    "monthly" (05/28/2016)  . Heart murmur   . High cholesterol   . Hypertension   . Pre-diabetes    BORDERLINE....WATCHES WHAT SHE EATS   Past Surgical History:  Procedure Laterality Date  . BREAST BIOPSY Left   . BREAST CYST EXCISION Left   . BREAST CYST EXCISION Right   . BREAST EXCISIONAL BIOPSY Left    benign  . CARPAL TUNNEL RELEASE Bilateral 02/2009 - 2016   right-left  . COLON SURGERY  05/2007   Archie Endo 02/05/2011  . COLONOSCOPY W/ BIOPSIES AND POLYPECTOMY    . I & D EXTREMITY Right 05/28/2016   "hand"  . I & D EXTREMITY  Right 05/28/2016   Procedure: IRRIGATION AND DEBRIDEMENT EXTREMITY;  Surgeon: Charlotte Crumb, MD;  Location: [facility];  Service: Orthopedics;  Laterality: Right;  . LUMBAR FUSION  11/20/2017  . VAGINAL HYSTERECTOMY     "had fibroids"    FAMILY HISTORY History reviewed. No pertinent family history.  SOCIAL HISTORY Social History   Tobacco Use  . Smoking status: Never Smoker  . Smokeless tobacco: Never Used  Substance Use Topics  . Alcohol use: No  . Drug use: No         OPHTHALMIC EXAM:  Base Eye Exam    Visual Acuity (Snellen - Linear)      Right Left   Dist [place] 20/20 -1 20/20 -2       Tonometry (Tonopen, 9:40 AM)      Right Left   Pressure 16 16       Pupils      Dark Light Shape React APD   Right 4 3 Round Brisk None   Left 4 3 Round Brisk None       Visual Fields (Counting fingers)      Left Right    Full Full       Extraocular Movement      Right Left    Full, Ortho Full, Ortho       Neuro/Psych    Oriented x3: Yes   Mood/Affect: Normal       Dilation    Both eyes: 1.0% Mydriacyl, 2.5% Phenylephrine @ 9:40 AM        Slit Lamp and Fundus Exam    Slit Lamp Exam      Right Left   Lids/Lashes Dermatochalasis - upper lid, mild Meibomian gland dysfunction Dermatochalasis - upper lid, mild Meibomian gland dysfunction   Conjunctiva/Sclera mild Melanosis mild Melanosis   Cornea Arcus, 1+ Punctate epithelial erosions Arcus, trace Punctate epithelial erosions   Anterior Chamber Deep and quiet Deep and quiet   Iris Round and dilated Round and dilated   Lens 1-2+ Nuclear sclerosis, 3+ Cortical cataract 2+ Nuclear sclerosis, 3+ Cortical cataract   Vitreous Vitreous syneresis Vitreous syneresis       Fundus Exam      Right Left   Disc Pink and Sharp, +PPP Pink and Sharp, milt tilt   C/D Ratio 0.4 0.4   Macula Flat, Good foveal reflex, Retinal pigment epithelial mottling, No heme or edema Flat, Good foveal reflex, Retinal pigment epithelial mottling, No  heme or edema   Vessels Vascular attenuation, Tortuousity Vascular attenuation, Tortuousity   Periphery Attached, oval shaped pigmented lesion at 1030 nevus v CHRPE, shallow schisis ST periphery Attached, shallow schisis with inner retinal holes at 0400  Refraction    Wearing Rx      Sphere Cylinder Axis Add   Right -0.50 +0.50 150 +2.25   Left +0.25 Sphere  +2.25   Type: Bifocal          IMAGING AND PROCEDURES  Imaging and Procedures for @TODAY @  OCT, Retina - OU - Both Eyes       Right Eye Quality was good. Central Foveal Thickness: 279. Progression has been stable. Findings include normal foveal contour, no SRF, no IRF, vitreomacular adhesion  (Peripheral schisis ST quad caught on widefield).   Left Eye Quality was good. Central Foveal Thickness: 279. Progression has no prior data. Findings include normal foveal contour, no SRF, no IRF, vitreomacular adhesion .   Notes *Images captured and stored on drive  Diagnosis / Impression:  NFP, no IRF/SRF OU VMA OU OD: Peripheral schisis ST quad caught on widefield   Clinical management:  See below  Abbreviations: NFP - Normal foveal profile. CME - cystoid macular edema. PED - pigment epithelial detachment. IRF - intraretinal fluid. SRF - subretinal fluid. EZ - ellipsoid zone. ERM - epiretinal membrane. ORA - outer retinal atrophy. ORT - outer retinal tubulation. SRHM - subretinal hyper-reflective material        Color Fundus Photography Optos - OU - Both Eyes       Right Eye Progression has been stable. Disc findings include normal observations. Macula : normal observations. Vessels : tortuous vessels, attenuated. Periphery : RPE abnormality (CHRPE at 1030).   Left Eye Progression has been stable. Disc findings include normal observations. Macula : normal observations. Vessels : tortuous vessels, attenuated. Periphery : (Retinoschisis w/ inner retinal holes at 0400).   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 Left retinoschisis  H33.102 Color Fundus Photography Optos - OU - Both Eyes  3. Retinal edema  H35.81 OCT, Retina - OU - Both Eyes  4. Essential hypertension  I10   5. Hypertensive retinopathy of both eyes  H35.033   6. Combined forms of age-related cataract of both eyes  H25.813     1. CHRPE OD  - flat, well-demarcated, pigmented lesion located at 1030  - no SRF  - differential includes nevus  - optos images obtained today (04.28.21) -- stable from prior  - monitor for now  - f/u 1 year, DFE, OCT, optos colors/FAF  2. Retinoschisis OS  - shallow schisis located at 0400 with inner retinal holes  - no RD  - monitor  - f/u 1 yr, sooner prn  3. No retinal edema on exam or OCT  4,5. Hypertensive retinopathy OU  - discussed importance of tight BP control  - monitor  6. Mixed form age related cataracts  - The symptoms of cataract, surgical options, and treatments and risks were discussed with patient.  - discussed diagnosis and progression  - under the expert management of Dr. Zenia Resides   Ophthalmic Meds Ordered this visit:  No orders of the defined types were placed in this encounter.      Return in about 1 year (around 01/31/2021) for f/u CHRPE OD, DFE, OCT, OPTOS.  There are no Patient Instructions on file  for this visit.   Explained the diagnoses, plan, and follow up with the patient and they expressed understanding.  Patient expressed understanding of the importance of proper follow up care.   This document serves as a record of services personally performed by Gardiner Sleeper, MD, PhD. It was created on their behalf by Annette Rios, OA, an ophthalmic assistant. The creation of this record is the  provider's dictation and/or activities during the visit.    Electronically signed by: Annette Rios, OA 04.28.2021 12:43 AM  Gardiner Sleeper, M.D., Ph.D. Diseases & Surgery of the Retina and [place] 02/01/2020   I have reviewed the above documentation for accuracy and completeness, and I agree with the above. Gardiner Sleeper, M.D., Ph.D. 02/03/20 12:43 AM   Abbreviations: M myopia (nearsighted); A astigmatism; H hyperopia (farsighted); P presbyopia; Mrx spectacle prescription;  CTL contact lenses; OD right eye; OS left eye; OU both eyes  XT exotropia; ET esotropia; PEK punctate epithelial keratitis; PEE punctate epithelial erosions; DES dry eye syndrome; MGD meibomian gland dysfunction; ATs artificial tears; PFAT's preservative free artificial tears; [place] nuclear sclerotic cataract; PSC posterior subcapsular cataract; ERM epi-retinal membrane; PVD posterior vitreous detachment; RD retinal detachment; DM diabetes mellitus; DR diabetic retinopathy; NPDR non-proliferative diabetic retinopathy; PDR proliferative diabetic retinopathy; CSME clinically significant macular edema; DME diabetic macular edema; dbh dot blot hemorrhages; CWS cotton wool spot; POAG primary open angle glaucoma; C/D cup-to-disc ratio; HVF humphrey visual field; GVF goldmann visual field; OCT optical coherence tomography; IOP intraocular pressure; BRVO Branch retinal vein occlusion; CRVO central retinal vein occlusion; CRAO central retinal artery occlusion; BRAO branch retinal artery occlusion; RT retinal tear; SB scleral buckle; PPV pars plana vitrectomy; VH Vitreous hemorrhage; PRP panretinal laser photocoagulation; IVK intravitreal kenalog; VMT vitreomacular traction; MH Macular hole;  NVD neovascularization of the disc; NVE neovascularization elsewhere; AREDS age related eye disease study; ARMD age related macular degeneration; POAG primary open angle glaucoma; EBMD epithelial/anterior basement  membrane dystrophy; ACIOL anterior chamber intraocular lens; IOL intraocular lens; PCIOL posterior chamber intraocular lens; Phaco/IOL phacoemulsification with intraocular lens placement; [place] photorefractive keratectomy; LASIK laser assisted in situ keratomileusis; HTN hypertension; DM diabetes mellitus; COPD chronic obstructive pulmonary disease

## 2020-02-01 ENCOUNTER — Ambulatory Visit (INDEPENDENT_AMBULATORY_CARE_PROVIDER_SITE_OTHER): Payer: Medicare HMO | Admitting: Ophthalmology

## 2020-02-01 ENCOUNTER — Encounter (INDEPENDENT_AMBULATORY_CARE_PROVIDER_SITE_OTHER): Payer: Self-pay | Admitting: Ophthalmology

## 2020-02-01 DIAGNOSIS — H25813 Combined forms of age-related cataract, bilateral: Secondary | ICD-10-CM | POA: Diagnosis not present

## 2020-02-01 DIAGNOSIS — I10 Essential (primary) hypertension: Secondary | ICD-10-CM | POA: Diagnosis not present

## 2020-02-01 DIAGNOSIS — Q141 Congenital malformation of retina: Secondary | ICD-10-CM | POA: Diagnosis not present

## 2020-02-01 DIAGNOSIS — H3581 Retinal edema: Secondary | ICD-10-CM

## 2020-02-01 DIAGNOSIS — H33102 Unspecified retinoschisis, left eye: Secondary | ICD-10-CM | POA: Diagnosis not present

## 2020-02-01 DIAGNOSIS — H35033 Hypertensive retinopathy, bilateral: Secondary | ICD-10-CM | POA: Diagnosis not present

## 2020-02-14 ENCOUNTER — Other Ambulatory Visit: Payer: Self-pay | Admitting: *Deleted

## 2020-02-15 MED ORDER — ATORVASTATIN CALCIUM 80 MG PO TABS: 80.0000 mg | ORAL_TABLET | Freq: Every day | ORAL | 3 refills | Status: DC

## 2020-03-30 IMAGING — MG MM DIGITAL SCREENING BILAT W/ TOMO W/ CAD
8 series · 8 of 24 positions shown · non-contrast
Comparison: Previous exam(s).

CLINICAL DATA: Screening.

EXAM:
DIGITAL SCREENING BILATERAL MAMMOGRAM WITH TOMO AND CAD

[R MLO synth-2D]
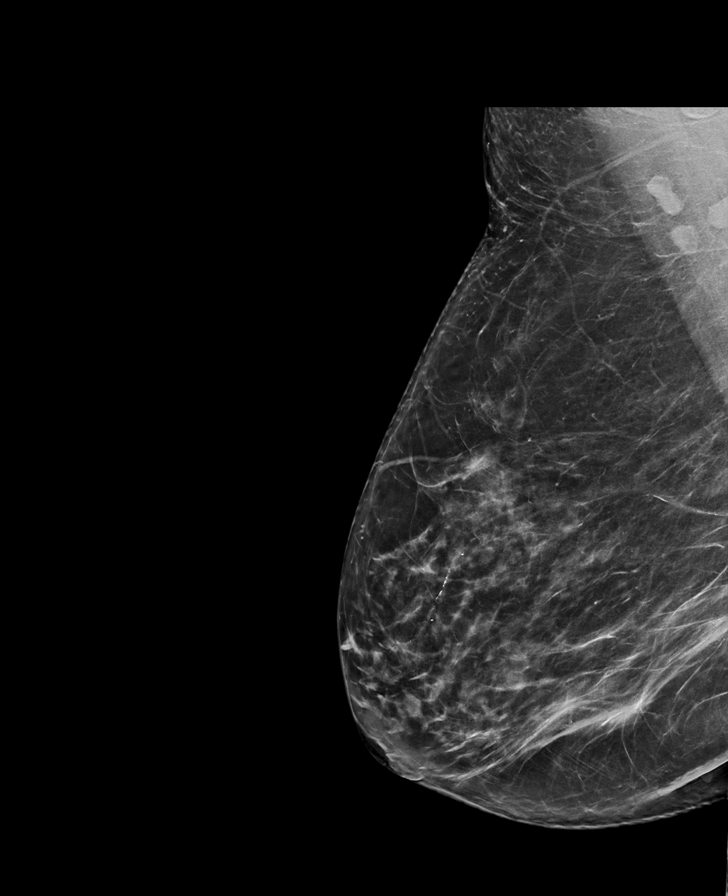

[L CC synth-2D]
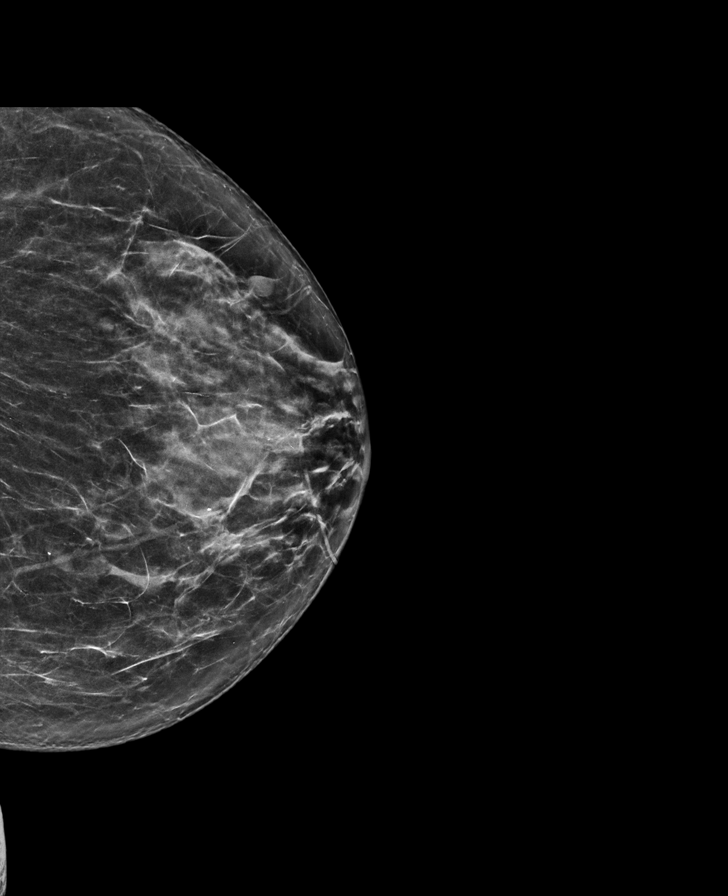

[R CC synth-2D]
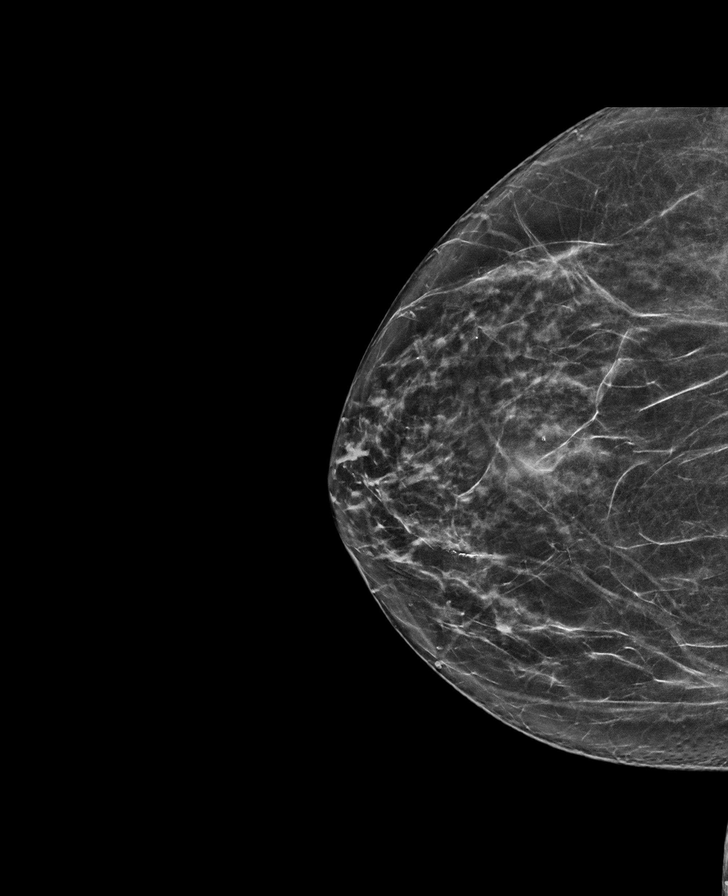

[L MLO synth-2D]
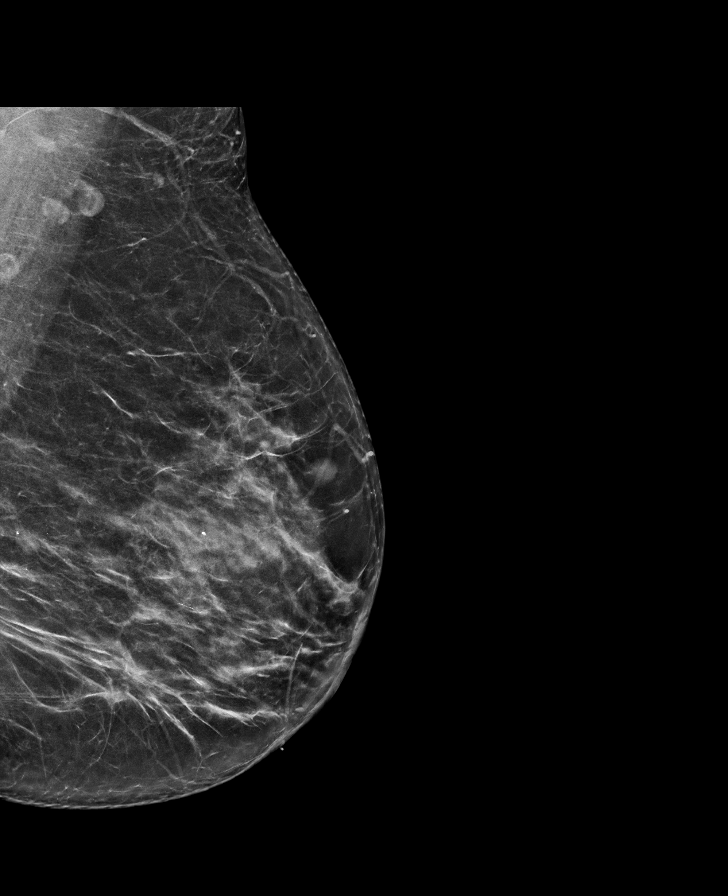

[R MLO tomo · tomo slice 41/82.0]
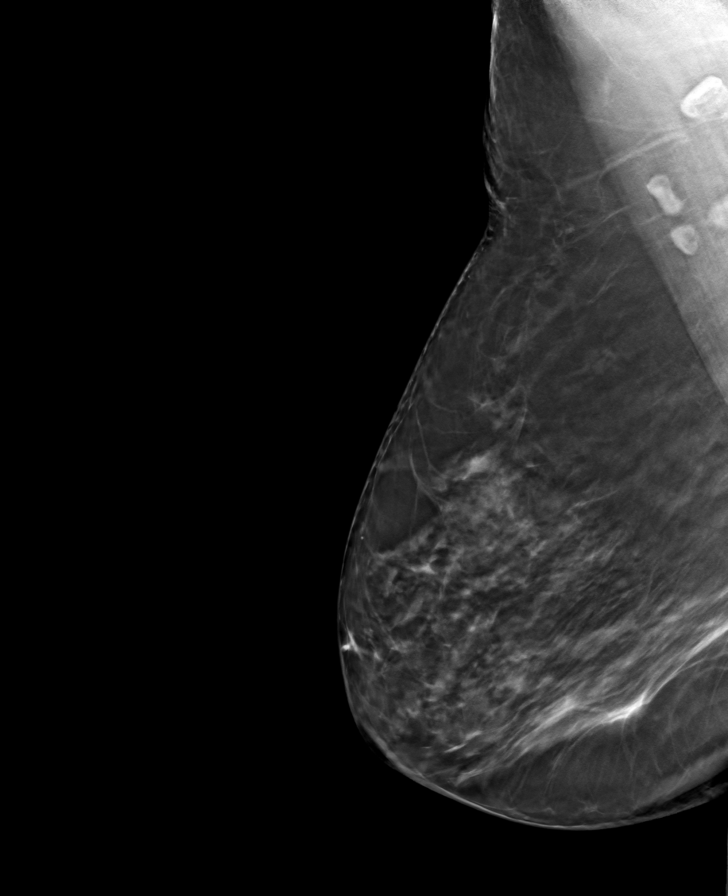

[R CC tomo · tomo slice 35/69.0]
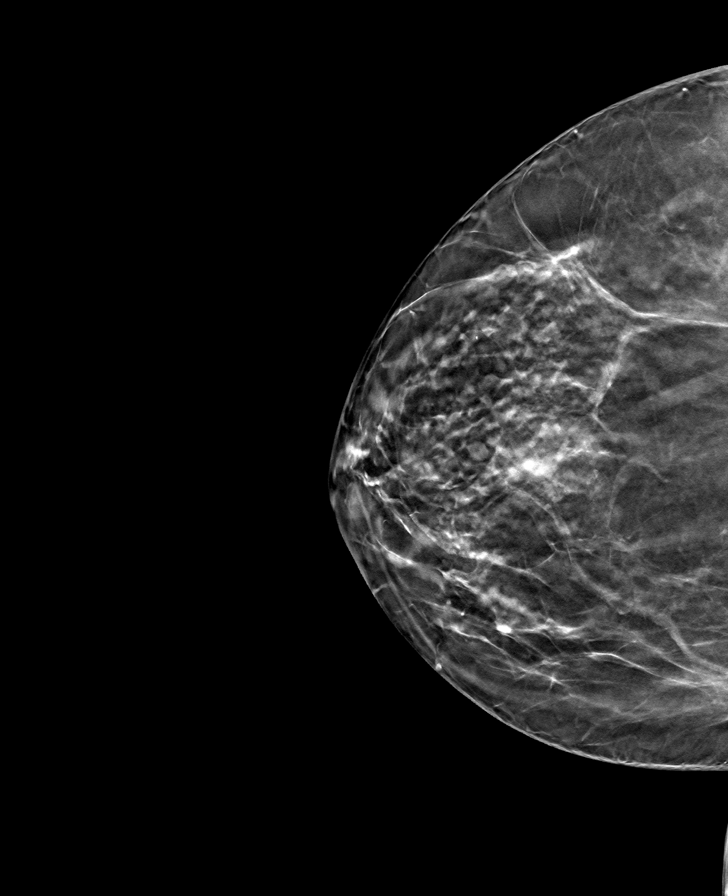

[L MLO tomo · tomo slice 37/74.0]
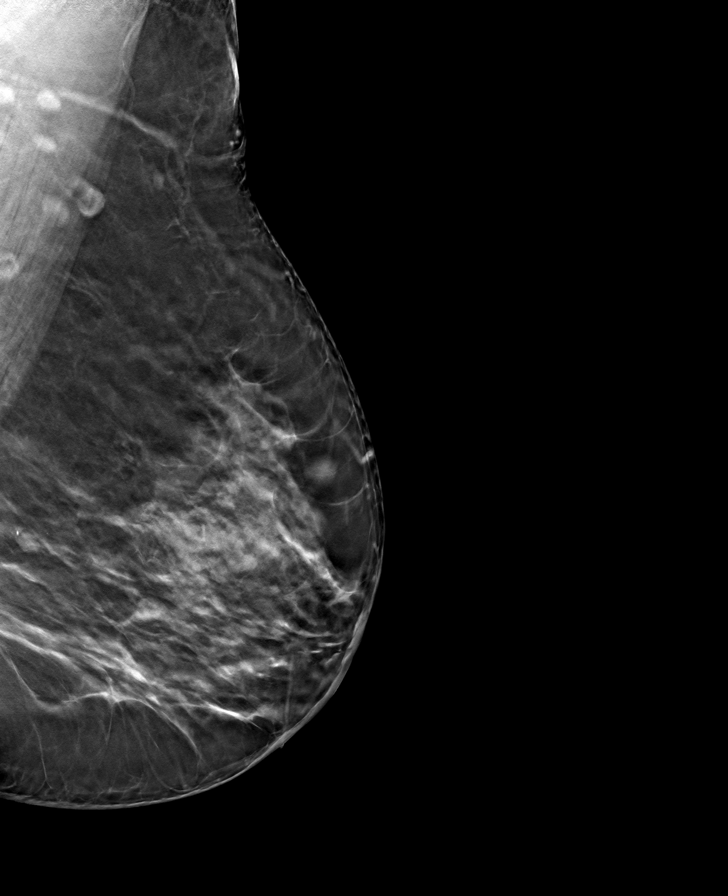

[L CC tomo · tomo slice 35/69.0]
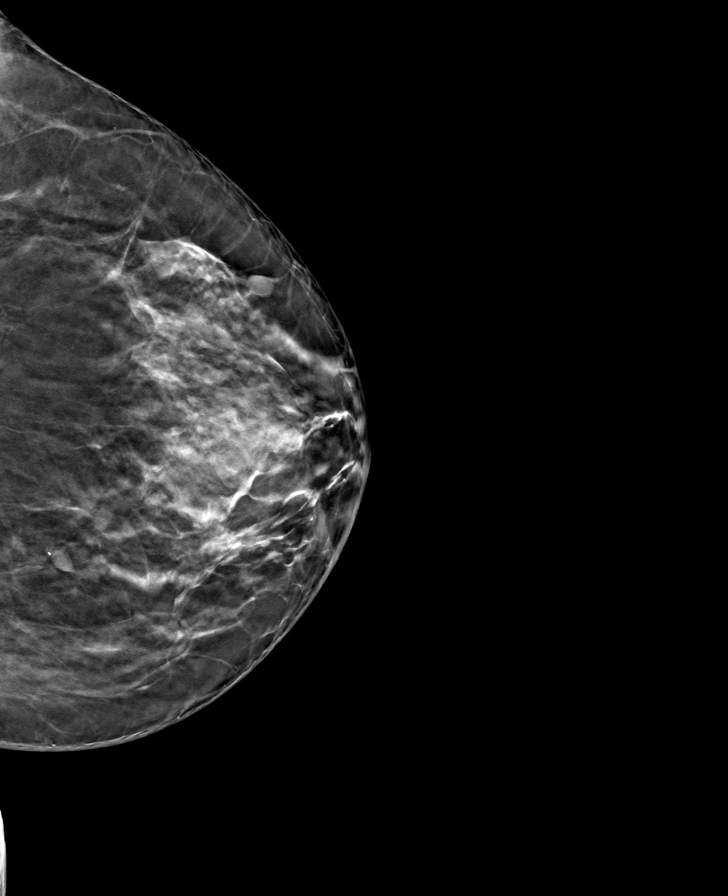

[8 of 24 positions shown; findings below may reference images not displayed]

ACR Breast Density Category c: The breast tissue is heterogeneously
dense, which may obscure small masses.
FINDINGS: There are no findings suspicious for malignancy. Images were
processed with CAD.
IMPRESSION: No mammographic evidence of malignancy. A result letter of this
screening mammogram will be mailed directly to the patient.

RECOMMENDATION:
Screening mammogram in one year. (Code:[id number])

BI-RADS CATEGORY  1: Negative.

## 2020-06-19 ENCOUNTER — Other Ambulatory Visit: Payer: Self-pay | Admitting: *Deleted

## 2020-06-20 MED ORDER — INDAPAMIDE 1.25 MG PO TABS: 1.2500 mg | ORAL_TABLET | Freq: Every day | ORAL | 3 refills | Status: DC

## 2020-08-07 ENCOUNTER — Other Ambulatory Visit: Payer: Self-pay | Admitting: Family Medicine

## 2020-08-14 ENCOUNTER — Other Ambulatory Visit: Payer: Self-pay

## 2020-08-14 MED ORDER — ESOMEPRAZOLE MAGNESIUM 40 MG PO CPDR: 40.0000 mg | DELAYED_RELEASE_CAPSULE | Freq: Every day | ORAL | 3 refills | Status: DC | PRN

## 2020-08-14 NOTE — Telephone Encounter (Signed)
Patient calls nurse line requesting refill on nexium. Patient is due for yearly physical. Scheduled for next Wednesday 11/17.   Talbot Grumbling, RN

## 2020-08-22 ENCOUNTER — Encounter: Payer: Self-pay | Admitting: Family Medicine

## 2020-08-22 ENCOUNTER — Other Ambulatory Visit: Payer: Self-pay

## 2020-08-22 ENCOUNTER — Ambulatory Visit (INDEPENDENT_AMBULATORY_CARE_PROVIDER_SITE_OTHER): Payer: Medicare HMO | Admitting: Family Medicine

## 2020-08-22 VITALS — BP 122/72 | HR 62 | Ht 68.0 in | Wt 185.4 lb

## 2020-08-22 DIAGNOSIS — M545 Low back pain, unspecified: Secondary | ICD-10-CM | POA: Diagnosis not present

## 2020-08-22 DIAGNOSIS — Z9049 Acquired absence of other specified parts of digestive tract: Secondary | ICD-10-CM | POA: Diagnosis not present

## 2020-08-22 DIAGNOSIS — I10 Essential (primary) hypertension: Secondary | ICD-10-CM

## 2020-08-22 DIAGNOSIS — E7800 Pure hypercholesterolemia, unspecified: Secondary | ICD-10-CM

## 2020-08-22 DIAGNOSIS — R739 Hyperglycemia, unspecified: Secondary | ICD-10-CM

## 2020-08-22 DIAGNOSIS — Z0000 Encounter for general adult medical examination without abnormal findings: Secondary | ICD-10-CM

## 2020-08-22 DIAGNOSIS — G8929 Other chronic pain: Secondary | ICD-10-CM | POA: Diagnosis not present

## 2020-08-22 DIAGNOSIS — R5383 Other fatigue: Secondary | ICD-10-CM | POA: Diagnosis not present

## 2020-08-22 LAB — POCT GLYCOSYLATED HEMOGLOBIN (HGB A1C): Hemoglobin A1C: 11.3 % — AB (ref 4.0–5.6)

## 2020-08-22 MED ORDER — AMLODIPINE BESYLATE 5 MG PO TABS
5.0000 mg | ORAL_TABLET | Freq: Every day | ORAL | 3 refills | Status: DC
Start: 2020-08-22 — End: 2021-09-12

## 2020-08-22 MED ORDER — ATORVASTATIN CALCIUM 80 MG PO TABS
80.0000 mg | ORAL_TABLET | Freq: Every day | ORAL | 3 refills | Status: DC
Start: 2020-08-22 — End: 2021-08-14

## 2020-08-22 MED ORDER — INDAPAMIDE 1.25 MG PO TABS
1.2500 mg | ORAL_TABLET | Freq: Every day | ORAL | 3 refills | Status: DC
Start: 2020-08-22 — End: 2021-09-12

## 2020-08-22 MED ORDER — ESOMEPRAZOLE MAGNESIUM 40 MG PO CPDR: 40.0000 mg | DELAYED_RELEASE_CAPSULE | Freq: Every day | ORAL | 3 refills | Status: DC | PRN

## 2020-08-22 NOTE — Patient Instructions (Signed)
I will send you a note about your labs. Continue your meds unchanged.  I have sent in refills. Have a great holiday season! Let me see you in 6 months for a check up and certainly call sooner if you need something.

## 2020-08-23 LAB — COMPREHENSIVE METABOLIC PANEL
ALT: 40 IU/L — ABNORMAL HIGH (ref 0–32)
AST: 27 IU/L (ref 0–40)
Albumin/Globulin Ratio: 1.4 (ref 1.2–2.2)
Albumin: 4 g/dL (ref 3.8–4.8)
Alkaline Phosphatase: 57 IU/L (ref 44–121)
BUN/Creatinine Ratio: 13 (ref 12–28)
BUN: 10 mg/dL (ref 8–27)
Bilirubin Total: 0.5 mg/dL (ref 0.0–1.2)
CO2: 25 mmol/L (ref 20–29)
Calcium: 9.1 mg/dL (ref 8.7–10.3)
Chloride: 100 mmol/L (ref 96–106)
Creatinine, Ser: 0.77 mg/dL (ref 0.57–1.00)
GFR calc Af Amer: 94 mL/min/{1.73_m2} (ref >59)
GFR calc non Af Amer: 81 mL/min/{1.73_m2} (ref >59)
Globulin, Total: 2.9 g/dL (ref 1.5–4.5)
Glucose: 230 mg/dL — ABNORMAL HIGH (ref 65–99)
Potassium: 3.6 mmol/L (ref 3.5–5.2)
Sodium: 139 mmol/L (ref 134–144)
Total Protein: 6.9 g/dL (ref 6.0–8.5)

## 2020-08-23 LAB — CBC
Hematocrit: 37.8 % (ref 34.0–46.6)
Hemoglobin: 12.9 g/dL (ref 11.1–15.9)
MCH: 31.5 pg (ref 26.6–33.0)
MCHC: 34.1 g/dL (ref 31.5–35.7)
MCV: 92 fL (ref 79–97)
Platelets: 261 10*3/uL (ref 150–450)
RBC: 4.09 x10E6/uL (ref 3.77–5.28)
RDW: 12.3 % (ref 11.7–15.4)
WBC: 4.9 10*3/uL (ref 3.4–10.8)

## 2020-08-23 LAB — LDL CHOLESTEROL, DIRECT: LDL Direct: 49 mg/dL (ref 0–99)

## 2020-08-23 NOTE — Assessment & Plan Note (Signed)
Excellent control.  Continue current medications and will check some labs today.

## 2020-08-23 NOTE — Assessment & Plan Note (Signed)
Currently she is doing well on

## 2020-08-23 NOTE — Assessment & Plan Note (Signed)
She does not want influenza or Covid vaccine.

## 2020-08-23 NOTE — Assessment & Plan Note (Signed)
.  We will check labs.  Continue atorvastatin at current dose.

## 2020-08-23 NOTE — Progress Notes (Signed)
    CHIEF COMPLAINT / HPI: Here for yearly checkup.  Has no current issues.  Tramadol is working well for her back pain.  She is taking all of her medicines regularly without issue.  Her energy level is little down in the last 4 to 6 weeks but that may be related to seasonal issues.  She does not feel depressed however.   PERTINENT  PMH / PSH: I have reviewed the patient's medications, allergies, past medical and surgical history, smoking status and updated in the EMR as appropriate. Partial colectomy  OBJECTIVE:  BP 122/72   Pulse 62   Ht 5\' 8"  (1.727 m)   Wt 185 lb 6.4 oz (84.1 kg)   SpO2 97%   BMI 28.19 kg/m  Vital signs reviewed. GENERAL: Well-developed, well-nourished, no acute distress. CARDIOVASCULAR: Regular rate and rhythm no murmur gallop or rub LUNGS: Clear to auscultation bilaterally, no rales or wheeze. ABDOMEN: Soft positive bowel sounds NEURO: No gross focal neurological deficits. MSK: Movement of extremity x 4.    ASSESSMENT / PLAN:   Well adult exam She does not want influenza or Covid vaccine.  BACK PAIN s/p PLIF 2019 Currently she is doing well on  HYPERTENSION, BENIGN SYSTEMIC Excellent control.  Continue current medications and will check some labs today.  HYPERCHOLESTEROLEMIA .We will check labs.  Continue atorvastatin at current dose.   Dorcas Mcmurray MD

## 2020-08-24 ENCOUNTER — Other Ambulatory Visit: Payer: Self-pay | Admitting: Family Medicine

## 2020-08-24 DIAGNOSIS — Z1231 Encounter for screening mammogram for malignant neoplasm of breast: Secondary | ICD-10-CM

## 2020-09-04 NOTE — Progress Notes (Signed)
We have scheduled her for appt tomorrow to discuss her blood sugar

## 2020-09-05 ENCOUNTER — Other Ambulatory Visit: Payer: Self-pay | Admitting: Family Medicine

## 2020-09-05 ENCOUNTER — Other Ambulatory Visit: Payer: Self-pay

## 2020-09-05 ENCOUNTER — Encounter: Payer: Self-pay | Admitting: Family Medicine

## 2020-09-05 ENCOUNTER — Ambulatory Visit (INDEPENDENT_AMBULATORY_CARE_PROVIDER_SITE_OTHER): Payer: Medicare HMO | Admitting: Family Medicine

## 2020-09-05 DIAGNOSIS — E119 Type 2 diabetes mellitus without complications: Secondary | ICD-10-CM | POA: Diagnosis not present

## 2020-09-05 MED ORDER — METFORMIN HCL ER 500 MG PO TB24: ORAL_TABLET | ORAL | 1 refills | Status: DC

## 2020-09-05 NOTE — Patient Instructions (Addendum)
You now have now diabetes. Let us start with a new medicine. For the first week, take one a day. Starting the second week take 2 a day. You can take them at the same time of day. Keep taking two a day until I see you back. Let me see you in January  Diabetes Basics  Diabetes (diabetes mellitus) is a long-term (chronic) disease. It occurs when the body does not properly use sugar (glucose) that is released from food after you eat. Diabetes may be caused by one or both of these problems:  Your pancreas does not make enough of a hormone called insulin.  Your body does not react in a normal way to insulin that it makes. Insulin lets sugars (glucose) go into cells in your body. This gives you energy. If you have diabetes, sugars cannot get into cells. This causes high blood sugar (hyperglycemia). Follow these instructions at home: How is diabetes treated? Diabetes medicines  Name of medicine: ____metformin__________________________  When is my next doctor's visit? January

## 2020-09-06 NOTE — Assessment & Plan Note (Signed)
Long discussion.  She has a lot of basic questions.  I gave her an addended handout for some basic information.  I think her main concern is that she does not want to have another chronic illness and she does not want to take any additional medication.  Much of our conversation was around whether or not she could get rid of this.  I tried to emphasize that treatment is our current goal.  I will start with a gradual increase of the low dose of Metformin.  She would likely benefit from multiple drug therapy but given our discussion, I would like to ramp up slowly.  I have made her an appointment to see me back in January.  I have not given her any instructions to do fingersticks at this point.  I do not think it would be beneficial.  We can reconsider that in the future.  Can also consider dietary education in the future.  Right now I think it would be overwhelming to her.  Total time spent 30 minutes.

## 2020-09-06 NOTE — Progress Notes (Signed)
    CHIEF COMPLAINT / HPI:  Follow-up lab result of hemoglobin A1c 11.3.  At last office visit she had complained of some fatigue and in her lab work she had elevated A1c.  It had been mildly elevated previously but we had not checked it in greater than 1 year.   She is very concerned that this is going she will be required to be on another long-term medication.  She has a lot of questions about diabetes. PERTINENT  PMH / PSH: I have reviewed the patient's medications, allergies, past medical and surgical history, smoking status and updated in the EMR as appropriate. Laboratory hemoglobin A1c 11.3  OBJECTIVE:  BP 116/68   Pulse 68   Ht 5\' 8"  (1.727 m)   Wt 185 lb 3.2 oz (84 kg)   SpO2 97%   BMI 28.16 kg/m  PSYCH: AxOx4. Good eye contact.. No psychomotor retardation or agitation. Appropriate speech fluency and content. Asks and answers questions appropriately. Mood is congruent. GENERAL: Well-developed female no acute distress  ASSESSMENT / PLAN:   Type 2 diabetes mellitus without complications ([place]) Long discussion.  She has a lot of basic questions.  I gave her an addended handout for some basic information.  I think her main concern is that she does not want to have another chronic illness and she does not want to take any additional medication.  Much of our conversation was around whether or not she could get rid of this.  I tried to emphasize that treatment is our current goal.  I will start with a gradual increase of the low dose of Metformin.  She would likely benefit from multiple drug therapy but given our discussion, I would like to ramp up slowly.  I have made her an appointment to see me back in January.  I have not given her any instructions to do fingersticks at this point.  I do not think it would be beneficial.  We can reconsider that in the future.  Can also consider dietary education in the future.  Right now I think it would be overwhelming to her.  Total time spent 30  minutes.   Dorcas Mcmurray MD

## 2020-09-26 ENCOUNTER — Telehealth: Payer: Self-pay

## 2020-09-26 MED ORDER — ONETOUCH VERIO W/DEVICE KIT: PACK | 0 refills | Status: DC

## 2020-09-26 NOTE — Telephone Encounter (Signed)
Patient calls nurse line requesting another glucometer be sent to her pharmacy. Patient reports hers is very old and has stopped working. Will send in a meter that matches her strips and insurance plan.

## 2020-10-10 ENCOUNTER — Encounter: Payer: Self-pay | Admitting: Family Medicine

## 2020-10-10 ENCOUNTER — Other Ambulatory Visit: Payer: Self-pay

## 2020-10-10 ENCOUNTER — Ambulatory Visit (INDEPENDENT_AMBULATORY_CARE_PROVIDER_SITE_OTHER): Payer: Medicare Other | Admitting: Family Medicine

## 2020-10-10 VITALS — BP 140/80 | HR 88 | Ht 68.0 in | Wt 184.0 lb

## 2020-10-10 DIAGNOSIS — E119 Type 2 diabetes mellitus without complications: Secondary | ICD-10-CM | POA: Diagnosis not present

## 2020-10-10 LAB — GLUCOSE, POCT (MANUAL RESULT ENTRY): POC Glucose: 124 mg/dl — AB (ref 70–99)

## 2020-10-11 ENCOUNTER — Ambulatory Visit
Admission: RE | Admit: 2020-10-11 | Discharge: 2020-10-11 | Disposition: A | Discharge status: Home / Self Care | Payer: Medicare HMO | Source: Ambulatory Visit | Attending: Family Medicine | Admitting: Family Medicine

## 2020-10-11 DIAGNOSIS — Z1231 Encounter for screening mammogram for malignant neoplasm of breast: Secondary | ICD-10-CM

## 2020-10-11 NOTE — Progress Notes (Signed)
    CHIEF COMPLAINT / HPI:  f/u unconrolled DM. She hs been taking metformin--at least once every day and about half the time every other day she will get both doses in. Watching her diet.   PERTINENT  PMH / PSH: I have reviewed the patient's medications, allergies, past medical and surgical history, smoking status and updated in the EMR as appropriate.   OBJECTIVE:  BP 140/80   Pulse 88   Ht 5\' 8"  (1.727 m)   Wt 184 lb (83.5 kg)   SpO2 95%   BMI 27.98 kg/m   Vital signs reviewed. GENERAL: Well-developed, well-nourished, no acute distress. CARDIOVASCULAR: Regular rate and rhythm no murmur gallop or rub LUNGS: Clear to auscultation bilaterally, no rales or wheeze. ABDOMEN: Soft positive bowel sounds MSK: Movement of extremity x 4.   ASSESSMENT / PLAN:   Type 2 diabetes mellitus without complications ([facility]) Counseling re DM  30 minutes face to face Applauded her attention to diet and improvement in medication compliance---although encouraged her to get both doses of metformin in on most days She has some increase in stools with that so she is hesitant CBG in office encouraging F/u 2 m if she can continue dietary and medication work, we hopefully sill not have to increase dose or add second agent (she does NOT want additional meds for DM)   MD

## 2020-10-11 NOTE — Assessment & Plan Note (Signed)
Counseling re DM  30 minutes face to face Applauded her attention to diet and improvement in medication compliance---although encouraged her to get both doses of metformin in on most days She has some increase in stools with that so she is hesitant CBG in office encouraging F/u 2 m if she can continue dietary and medication work, we hopefully sill not have to increase dose or add second agent (she does NOT want additional meds for DM)

## 2020-11-12 ENCOUNTER — Encounter: Payer: Self-pay | Admitting: Family Medicine

## 2020-11-28 ENCOUNTER — Other Ambulatory Visit: Payer: Self-pay | Admitting: Family Medicine

## 2020-12-03 ENCOUNTER — Other Ambulatory Visit: Payer: Self-pay | Admitting: Family Medicine

## 2021-01-30 ENCOUNTER — Ambulatory Visit (INDEPENDENT_AMBULATORY_CARE_PROVIDER_SITE_OTHER): Payer: Medicare Other

## 2021-01-30 VITALS — BP 136/72 | HR 76 | Wt 179.0 lb

## 2021-01-30 DIAGNOSIS — Z0000 Encounter for general adult medical examination without abnormal findings: Secondary | ICD-10-CM | POA: Diagnosis not present

## 2021-01-30 NOTE — Patient Instructions (Addendum)
You spoke to Dorna Bloom, [place] for your annual wellness visit.  We discussed goals: Goals    . HEMOGLOBIN A1C < 7     11.08 August 2020 Counseled on diet and exercise      We also discussed recommended health maintenance. As discussed, you are due for the following.  Health Maintenance  Topic Date Due  . COVID-19 Vaccine (1) Never done  . FOOT EXAM  Never done  . OPHTHALMOLOGY EXAM  Never done  . URINE MICROALBUMIN  Never done  . DEXA SCAN  Never done  . PNA vac Low Risk Adult (1 of 2 - PCV13) Never done  . HEMOGLOBIN A1C  02/19/2021  . INFLUENZA VACCINE  05/06/2021  . MAMMOGRAM  10/11/2022  . TETANUS/TDAP  10/25/2025  . COLONOSCOPY (Pts 45-73yrs Insurance coverage will need to be confirmed)  06/18/2027  . Hepatitis C Screening  Completed  . HIV Screening  Completed  . HPV VACCINES  Aged Out   Keep up the good work going to the [employer]! Cut back on the teas and juices as we discussed to help lower your A1C. Fill out an advance directive. PCP apt scheduled for 02/20/2021 @9 :50am. Schedule a DEXA exam with next mammogram apt.   We also discussed getting the Covid Vaccine. We have [facility] here if you are ever interested. Please call and make a nurse visit.   Health Maintenance, Female Adopting a healthy lifestyle and getting preventive care are important in promoting health and wellness. Ask your health care provider about:  The right schedule for you to have regular tests and exams.  Things you can do on your own to prevent diseases and keep yourself healthy. What should I know about diet, weight, and exercise? Eat a healthy diet  Eat a diet that includes plenty of vegetables, fruits, low-fat dairy products, and lean protein.  Do not eat a lot of foods that are high in solid fats, added sugars, or sodium.   Maintain a healthy weight Body mass index (BMI) is used to identify weight problems. It estimates body fat based on height and weight. Your health care provider can  help determine your BMI and help you achieve or maintain a healthy weight. Get regular exercise Get regular exercise. This is one of the most important things you can do for your health. Most adults should:  Exercise for at least 150 minutes each week. The exercise should increase your heart rate and make you sweat (moderate-intensity exercise).  Do strengthening exercises at least twice a week. This is in addition to the moderate-intensity exercise.  Spend less time sitting. Even light physical activity can be beneficial. Watch cholesterol and blood lipids Have your blood tested for lipids and cholesterol at 66 years of age, then have this test every 5 years. Have your cholesterol levels checked more often if:  Your lipid or cholesterol levels are high.  You are older than 65 years of age.  You are at high risk for heart disease. What should I know about cancer screening? Depending on your health history and family history, you may need to have cancer screening at various ages. This may include screening for:  Breast cancer.  Cervical cancer.  Colorectal cancer.  Skin cancer.  Lung cancer. What should I know about heart disease, diabetes, and high blood pressure? Blood pressure and heart disease  High blood pressure causes heart disease and increases the risk of stroke. This is more likely to develop in people who have  high blood pressure readings, are of African descent, or are overweight.  Have your blood pressure checked: ? Every 3-5 years if you are 56-4 years of age. ? Every year if you are 84 years old or older. Diabetes Have regular diabetes screenings. This checks your fasting blood sugar level. Have the screening done:  Once every three years after age 98 if you are at a normal weight and have a low risk for diabetes.  More often and at a younger age if you are overweight or have a high risk for diabetes. What should I know about preventing infection? Hepatitis  B If you have a higher risk for hepatitis B, you should be screened for this virus. Talk with your health care provider to find out if you are at risk for hepatitis B infection. Hepatitis C Testing is recommended for:  Everyone born from [birth year] through 1966.  Anyone with known risk factors for hepatitis C. Sexually transmitted infections (STIs)  Get screened for STIs, including gonorrhea and chlamydia, if: ? You are sexually active and are younger than 66 years of age. ? You are older than 66 years of age and your health care provider tells you that you are at risk for this type of infection. ? Your sexual activity has changed since you were last screened, and you are at increased risk for chlamydia or gonorrhea. Ask your health care provider if you are at risk.  Ask your health care provider about whether you are at high risk for HIV. Your health care provider may recommend a prescription medicine to help prevent HIV infection. If you choose to take medicine to prevent HIV, you should first get tested for HIV. You should then be tested every 3 months for as long as you are taking the medicine. Pregnancy  If you are about to stop having your period (premenopausal) and you may become pregnant, seek counseling before you get pregnant.  Take 400 to 800 micrograms (mcg) of folic acid every day if you become pregnant.  Ask for birth control (contraception) if you want to prevent pregnancy. Osteoporosis and menopause Osteoporosis is a disease in which the bones lose minerals and strength with aging. This can result in bone fractures. If you are 62 years old or older, or if you are at risk for osteoporosis and fractures, ask your health care provider if you should:  Be screened for bone loss.  Take a calcium or vitamin D supplement to lower your risk of fractures.  Be given hormone replacement therapy (HRT) to treat symptoms of menopause. Follow these instructions at home: Lifestyle  Do not  use any products that contain nicotine or tobacco, such as cigarettes, e-cigarettes, and chewing tobacco. If you need help quitting, ask your health care provider.  Do not use street drugs.  Do not share needles.  Ask your health care provider for help if you need support or information about quitting drugs. Alcohol use  Do not drink alcohol if: ? Your health care provider tells you not to drink. ? You are pregnant, may be pregnant, or are planning to become pregnant.  If you drink alcohol: ? Limit how much you use to 0-1 drink a day. ? Limit intake if you are breastfeeding.  Be aware of how much alcohol is in your drink. In the [place] one drink equals one 12 oz bottle of beer (355 mL), one 5 oz glass of wine (148 mL), or one 1 oz glass of hard liquor (44 mL).  General instructions  Schedule regular health, dental, and eye exams.  Stay current with your vaccines.  Tell your health care provider if: ? You often feel depressed. ? You have ever been abused or do not feel safe at home. Summary  Adopting a healthy lifestyle and getting preventive care are important in promoting health and wellness.  Follow your health care provider's instructions about healthy diet, exercising, and getting tested or screened for diseases.  Follow your health care provider's instructions on monitoring your cholesterol and blood pressure. This information is not intended to replace advice given to you by your health care provider. Make sure you discuss any questions you have with your health care provider. Document Revised: 09/15/2018 Document Reviewed: 09/15/2018 Elsevier Patient Education  [place].   Diet Recommendations for Diabetes   1. Eat at least 3 meals and 1-2 snacks per day. Never go more than 4-5 hours while awake without eating. Eat breakfast within the first hour of getting up.   2. Limit starchy foods to TWO per meal and ONE per snack. ONE portion of a starchy  food is equal  to the following:   - ONE slice of bread (or its equivalent, such as half of a hamburger bun).   - 1/2 cup of a "scoopable" starchy food such as potatoes or rice.   - 15 grams of Total Carbohydrate as shown on food label.  3. Include at every meal: a protein food, a carb food, and vegetables and/or fruit.   - Obtain twice the volume of vegetables as protein or carbohydrate foods for both lunch and dinner.   - Fresh or frozen vegetables are best.   - Keep frozen vegetables on hand for a quick vegetable serving.       Starchy (carb) foods: Bread, rice, pasta, potatoes, corn, cereal, grits, crackers, bagels, muffins, all baked goods.  (Fruits, milk, and yogurt also have carbohydrate, but most of these foods will not spike your blood sugar as most starchy foods will.)  A few fruits do cause high blood sugars; use small portions of bananas (limit to 1/2 at a time), grapes, watermelon, oranges, and most tropical fruits.    Protein foods: Meat, fish, poultry, eggs, dairy foods, and beans such as pinto and kidney beans (beans also provide carbohydrate).      Here is an example of what a healthy plate looks like:    ? Make half your plate fruits and vegetables.     ? Focus on whole fruits.     ? Vary your veggies.  ? Make half your grains whole grains. -     ? Look for the word "whole" at the beginning of the ingredients list    ? Some whole-grain ingredients include whole oats, whole-wheat flour,        whole-grain corn, whole-grain brown rice, and whole rye.  ? Move to low-fat and fat-free milk or yogurt.  ? Vary your protein routine. - Meat, fish, poultry (chicken, [place]), eggs, beans (kidney, pinto), dairy.  ? Drink and eat less sodium, saturated fat, and added sugars.   Our clinic's number is [phone]. Please call with questions or concerns about what we discussed today.

## 2021-01-30 NOTE — Progress Notes (Addendum)
Subjective:   Annette Rios is a 65 y.o. female who presents for Medicare Annual (Subsequent) preventive examination.  Review of Systems: Defer to PCP.  Cardiac Risk Factors include: advanced age (>76mn, >>40women);diabetes mellitus;hypertension  Objective:   Vitals: BP 136/72   Pulse 76   Wt 179 lb (81.2 kg)   SpO2 95%   BMI 27.22 kg/m   Body mass index is 27.22 kg/m.  Advanced Directives 01/30/2021 10/10/2020 08/22/2020 06/22/2019 11/30/2018 11/20/2017 11/19/2016  Does Patient Have a Medical Advance Directive? No - No No No No No  Would patient like information on creating a medical advance directive? No - Patient declined No - Patient declined Yes (MAU/Ambulatory/Procedural Areas - Information given) No - Patient declined No - Patient declined No - Patient declined No - Patient declined   Tobacco Social History   Tobacco Use  Smoking Status Never Smoker  Smokeless Tobacco Never Used     Clinical Intake:  Pre-visit preparation completed: Yes  Pain Score: 0-No pain  How often do you need to have someone help you when you read instructions, pamphlets, or other written materials from your doctor or pharmacy?: 2 - Rarely What is the last grade level you completed in school?: High School  Past Medical History:  Diagnosis Date  . Arthritis    "in my back" (05/28/2016)  . Borderline diabetes   . Cataract    RIGHT EYE  . Chronic lower back pain   . Claustrophobia   . GERD (gastroesophageal reflux disease)   . Headache    "monthly" (05/28/2016)  . Heart murmur   . High cholesterol   . Hypertension   . Pre-diabetes    BORDERLINE....WATCHES WHAT SHE EATS   Past Surgical History:  Procedure Laterality Date  . BREAST BIOPSY Left   . BREAST CYST EXCISION Left   . BREAST CYST EXCISION Right   . BREAST EXCISIONAL BIOPSY Left    benign  . CARPAL TUNNEL RELEASE Bilateral 02/2009 - 2016   right-left  . COLON SURGERY  05/2007   /Archie Endo5/11/2010  . COLONOSCOPY W/ BIOPSIES  AND POLYPECTOMY    . I & D EXTREMITY Right 05/28/2016   "hand"  . I & D EXTREMITY Right 05/28/2016   Procedure: IRRIGATION AND DEBRIDEMENT EXTREMITY;  Surgeon: MCharlotte Crumb MD;  [facility]  Service: Orthopedics;  Laterality: Right;  . LUMBAR FUSION  11/20/2017  . VAGINAL HYSTERECTOMY     "had fibroids"   Family History  Problem Relation Age of Onset  . Hypertension Mother   . Hypertension Father   . Drug abuse Father   . Hypertension Sister   . Hypertension Brother    Social History   Socioeconomic History  . Marital status: Divorced    Spouse name: Not on file  . Number of children: 2  . Years of education: 156 . Highest education level: High school graduate  Occupational History  . Not on file  Tobacco Use  . Smoking status: Never Smoker  . Smokeless tobacco: Never Used  Vaping Use  . Vaping Use: Never used  Substance and Sexual Activity  . Alcohol use: No  . Drug use: No  . Sexual activity: Not Currently  Other Topics Concern  . Not on file  Social History Narrative   Patient lives with her niece in [place]    Patient has two sons who live in [place]   [facility] [employer] days a [facility].  Patient likes to play horse shoes and bowl.   Patient enjoys watching scary movies, soap operas, and westerns.             Social Determinants of Health   Financial Resource Strain: Low Risk   . Difficulty of Paying Living Expenses: Not very hard  Food Insecurity: No Food Insecurity  . Worried About Charity fundraiser in the Last Year: Never true  . Ran Out of Food in the Last Year: Never true  Transportation Needs: No Transportation Needs  . Lack of Transportation (Medical): No  . Lack of Transportation (Non-Medical): No  Physical Activity: Sufficiently Active  . Days of Exercise per Week: 3 days  . Minutes of Exercise per Session: 140 min  Stress: No Stress Concern Present  . Feeling of Stress : Only a little  Social  Connections: Moderately Isolated  . Frequency of Communication with Friends and Family: More than three times a week  . Frequency of Social Gatherings with Friends and Family: More than three times a week  . Attends Religious Services: 1 to 4 times per year  . Active Member of Clubs or Organizations: No  . Attends Archivist Meetings: Never  . Marital Status: Divorced   Outpatient Encounter Medications as of 01/30/2021  Medication Sig  . amitriptyline (ELAVIL) 10 MG tablet Take one by mouth at bedtime for muscle spasm may make you sleepy  . amLODipine (NORVASC) 5 MG tablet Take 1 tablet (5 mg total) by mouth daily.  Marland Kitchen atorvastatin (LIPITOR) 80 MG tablet Take 1 tablet (80 mg total) by mouth daily.  . Blood Glucose Monitoring Suppl (ONETOUCH VERIO) w/Device KIT Use to check blood sugars 3x per day. E11.9  . diclofenac sodium (VOLTAREN) 1 % GEL Apply 2 g topically 4 (four) times daily.  Marland Kitchen esomeprazole (NEXIUM) 40 MG capsule Take 1 capsule (40 mg total) by mouth daily as needed (for acid reflux).  . indapamide (LOZOL) 1.25 MG tablet Take 1 tablet (1.25 mg total) by mouth daily.  . metFORMIN (GLUCOPHAGE-XR) 500 MG 24 hr tablet TAKE 1 TABLET BY MOUTH EVERY DAY WITH BREAKFAST FOR 1 WEEK, THEN INCREASE TO 2 TABLETS DAILY WITH BREAKFAST THEREAFTER.  . traMADol (ULTRAM) 50 MG tablet TAKE 2 TABLETS BY MOUTH TWICE DAILY AS NEEDED FOR PAIN   No facility-administered encounter medications on file as of 01/30/2021.   Activities of Daily Living In your present state of health, do you have any difficulty performing the following activities: 01/30/2021  Hearing? N  Vision? N  Difficulty concentrating or making decisions? N  Walking or climbing stairs? N  Dressing or bathing? N  Doing errands, shopping? N  Preparing Food and eating ? N  Using the Toilet? N  In the past six months, have you accidently leaked urine? N  Do you have problems with loss of bowel control? N  Managing your Medications?  N  Managing your Finances? N  Housekeeping or managing your Housekeeping? N  Some recent data might be hidden   Patient Care Team: Dickie La, MD as PCP - General (Family Medicine) Bernarda Caffey, MD as Consulting Physician (Ophthalmology)    Assessment:   This is a routine wellness examination for Yanet.  Exercise Activities and Dietary recommendations Current Exercise Habits: Home exercise routine, Type of exercise: walking;treadmill;stretching;strength training/weights, Time (Minutes): > 60, Frequency (Times/Week): 3, Weekly Exercise (Minutes/Week): 0, Intensity: Moderate, Exercise limited by: orthopedic condition(s)  Goals    . HEMOGLOBIN A1C < 7  11.08 August 2020 Counseled on diet and exercise      Fall Risk Fall Risk  01/30/2021 10/10/2020 09/05/2020 08/24/2019 06/22/2019  Falls in the past year? 0 0 0 0 0  Number falls in past yr: - 0 0 0 -  Injury with Fall? - 0 - - -  Follow up - - Falls evaluation completed Falls evaluation completed -   Is the patient's home free of loose throw rugs in walkways, pet beds, electrical cords, etc?   yes      Grab bars in the bathroom? yes      Handrails on the stairs?   yes      Adequate lighting?   yes  Patient rating of health (0-10) scale: 10  Depression Screen PHQ 2/9 Scores 01/30/2021 01/30/2021 10/10/2020 09/05/2020  PHQ - 2 Score 0 0 0 0  PHQ- 9 Score - - 0 2    Cognitive Function 6CIT Screen 01/30/2021  What Year? 0 points  What month? 0 points  What time? 0 points  Count back from 20 0 points  Months in reverse 0 points  Repeat phrase 0 points  Total Score 0   Immunization History  Administered Date(s) Administered  . Td 06/06/1997  . Tdap 10/26/2015   Screening Tests Health Maintenance  Topic Date Due  . COVID-19 Vaccine (1) Never done  . FOOT EXAM  Never done  . OPHTHALMOLOGY EXAM  Never done  . URINE MICROALBUMIN  Never done  . DEXA SCAN  Never done  . PNA vac Low Risk Adult (1 of 2 - PCV13) Never done   . HEMOGLOBIN A1C  02/19/2021  . INFLUENZA VACCINE  05/06/2021  . MAMMOGRAM  10/11/2022  . TETANUS/TDAP  10/25/2025  . COLONOSCOPY (Pts 45-64yr Insurance coverage will need to be confirmed)  06/18/2027  . Hepatitis C Screening  Completed  . HIV Screening  Completed  . HPV VACCINES  Aged Out   Cancer Screenings: Lung: Low Dose CT Chest recommended if Age 66-80years, 30 pack-year currently smoking OR have quit w/in 15years. Patient does not qualify. Breast:  Up to date on Mammogram? Yes   Up to date of Bone Density/Dexa? No Colorectal: Due 06/18/2027  Additional Screenings: Hepatitis C Screening: Completed  HIV Screening: Completed  Pap smear: Hysterectomy   Plan:  Keep up the good work going to the [employer] Cut back on the teas and juices as we discussed to help lower your A1C. Fill out an advance directive. PCP apt scheduled for 02/20/2021 '@9' :50am. Schedule a DEXA exam with next mammogram apt.   I have personally reviewed and noted the following in the patient's chart:   . Medical and social history . Use of alcohol, tobacco or illicit drugs  . Current medications and supplements . Functional ability and status . Nutritional status . Physical activity . Advanced directives . List of other physicians . Hospitalizations, surgeries, and ER visits in previous 12 months . Vitals . Screenings to include cognitive, depression, and falls . Referrals and appointments  In addition, I have reviewed and discussed with patient certain preventive protocols, quality metrics, and best practice recommendations. A written personalized care plan for preventive services as well as general preventive health recommendations were provided to patient.  EDorna Bloom [place] 01/30/2021   I have reviewed this visit and agree with the documentation.  SDorcas Mcmurray

## 2021-02-04 NOTE — Progress Notes (Signed)
[facility] Note  02/06/2021     CHIEF COMPLAINT Patient presents for Retina Follow Up   HISTORY OF PRESENT ILLNESS: Annette Rios is a 65 y.o. female who presents to the clinic today for:   HPI    Retina Follow Up    Patient presents with  Other.  In right eye.  This started years ago.  Severity is mild.  Duration of 1 year.  Since onset it is stable.  I, the attending physician,  performed the HPI with the patient and updated documentation appropriately.          Comments    65 y/o female pt here for 1 yr f/u for CHRPE OD.  No change in [place] OU.  Denies pain, FOL.  Has small floaters OU.  AT prn OU.  BS 120 2 mos ago.  A1C unknown.       Last edited by Bernarda Caffey, MD on 02/08/2021 11:34 PM. (History)    pt states no change in vision   Referring physician: Debbra Riding, MD [address] [place],  [place] [address]  HISTORICAL INFORMATION:   Selected notes from the MEDICAL RECORD NUMBER Referred by Dr. Wyatt Portela for concern of a nevus OD LEE: (S. Groat) [BCVA: OD: OS:] Ocular Hx-cataract OD PMH-arthritis, borderline diabetic, headaches, heart murmur, HLD, HTN    CURRENT MEDICATIONS: No current outpatient medications on file. (Ophthalmic Drugs)   No current facility-administered medications for this visit. (Ophthalmic Drugs)   Current Outpatient Medications (Other)  Medication Sig  . amitriptyline (ELAVIL) 10 MG tablet Take one by mouth at bedtime for muscle spasm may make you sleepy  . amLODipine (NORVASC) 5 MG tablet Take 1 tablet (5 mg total) by mouth daily.  Marland Kitchen atorvastatin (LIPITOR) 80 MG tablet Take 1 tablet (80 mg total) by mouth daily.  . Blood Glucose Monitoring Suppl (ONETOUCH VERIO) w/Device KIT Use to check blood sugars 3x per day. E11.9  . diclofenac sodium (VOLTAREN) 1 % GEL Apply 2 g topically 4 (four) times daily.  Marland Kitchen esomeprazole (NEXIUM) 40 MG capsule Take 1 capsule (40 mg total) by mouth daily as needed (for  acid reflux).  . indapamide (LOZOL) 1.25 MG tablet Take 1 tablet (1.25 mg total) by mouth daily.  . metFORMIN (GLUCOPHAGE-XR) 500 MG 24 hr tablet TAKE 1 TABLET BY MOUTH EVERY DAY WITH BREAKFAST FOR 1 WEEK, THEN INCREASE TO 2 TABLETS DAILY WITH BREAKFAST THEREAFTER.  . traMADol (ULTRAM) 50 MG tablet TAKE 2 TABLETS BY MOUTH TWICE DAILY AS NEEDED FOR PAIN   No current facility-administered medications for this visit. (Other)      REVIEW OF SYSTEMS: ROS    Positive for: Gastrointestinal, Musculoskeletal, Endocrine, Eyes   Negative for: Constitutional, Neurological, Skin, Genitourinary, HENT, Cardiovascular, Respiratory, Psychiatric, Allergic/Imm, Heme/Lymph   Last edited by Matthew Folks, COA on 02/06/2021  9:44 AM. (History)       ALLERGIES Allergies  Allergen Reactions  . Penicillins Rash and Other (See Comments)    PATIENT HAS HAD A PCN REACTION WITH IMMEDIATE RASH, FACIAL/TONGUE/THROAT SWELLING, SOB, OR LIGHTHEADEDNESS WITH HYPOTENSION:  #  #  #  YES  #  #  #   Has patient had a PCN reaction causing severe rash involving mucus membranes or skin necrosis: NO Has patient had a PCN reaction that required hospitalization NO Has patient had a PCN reaction occurring within the last 10 years: NO If all of the above answers  are "NO", then may proceed with Cephalosporin use.    PAST MEDICAL HISTORY Past Medical History:  Diagnosis Date  . Arthritis    "in my back" (05/28/2016)  . Borderline diabetes   . Cataract    RIGHT EYE  . Chronic lower back pain   . Claustrophobia   . GERD (gastroesophageal reflux disease)   . Headache    "monthly" (05/28/2016)  . Heart murmur   . High cholesterol   . Hypertension   . Hypertensive retinopathy    OU  . Pre-diabetes    BORDERLINE....WATCHES WHAT SHE EATS   Past Surgical History:  Procedure Laterality Date  . BREAST BIOPSY Left   . BREAST CYST EXCISION Left   . BREAST CYST EXCISION Right   . BREAST EXCISIONAL BIOPSY Left    benign   . CARPAL TUNNEL RELEASE Bilateral 02/2009 - 2016   right-left  . COLON SURGERY  05/2007   Archie Endo 02/05/2011  . COLONOSCOPY W/ BIOPSIES AND POLYPECTOMY    . I & D EXTREMITY Right 05/28/2016   "hand"  . I & D EXTREMITY Right 05/28/2016   Procedure: IRRIGATION AND DEBRIDEMENT EXTREMITY;  Surgeon: Charlotte Crumb, MD;  Location: [facility];  Service: Orthopedics;  Laterality: Right;  . LUMBAR FUSION  11/20/2017  . VAGINAL HYSTERECTOMY     "had fibroids"    FAMILY HISTORY Family History  Problem Relation Age of Onset  . Hypertension Mother   . Hypertension Father   . Drug abuse Father   . Hypertension Sister   . Hypertension Brother     SOCIAL HISTORY Social History   Tobacco Use  . Smoking status: Never Smoker  . Smokeless tobacco: Never Used  Vaping Use  . Vaping Use: Never used  Substance Use Topics  . Alcohol use: No  . Drug use: No         OPHTHALMIC EXAM:  Base Eye Exam    Visual Acuity (Snellen - Linear)      Right Left   Dist [place] 20/20 - 20/20 -2       Tonometry (Tonopen, 9:47 AM)      Right Left   Pressure 16 17       Pupils      Dark Light Shape React APD   Right 4 3 Round Brisk None   Left 4 3 Round Brisk None       Visual Fields (Counting fingers)      Left Right    Full Full       Extraocular Movement      Right Left    Full, Ortho Full, Ortho       Neuro/Psych    Oriented x3: Yes   Mood/Affect: Normal       Dilation    Both eyes: 1.0% Mydriacyl, 2.5% Phenylephrine @ 9:47 AM        Slit Lamp and Fundus Exam    Slit Lamp Exam      Right Left   Lids/Lashes Dermatochalasis - upper lid, mild Meibomian gland dysfunction Dermatochalasis - upper lid, mild Meibomian gland dysfunction   Conjunctiva/Sclera mild Melanosis mild Melanosis   Cornea Arcus, trace PEE Arcus, 1+ inferior Punctate epithelial erosions   Anterior Chamber Deep and quiet Deep and quiet   Iris Round and dilated Round and dilated   Lens 2+ Nuclear sclerosis, 3+ Cortical  cataract 2+ Nuclear sclerosis, 3+ Cortical cataract   Vitreous Vitreous syneresis Vitreous syneresis       Fundus Exam  Right Left   Disc Pink and Sharp, +PPP, Compact Pink and Sharp, milt tilt   C/D Ratio 0.4 0.4   Macula Flat, Good foveal reflex, Retinal pigment epithelial mottling, No heme or edema Flat, Good foveal reflex, Retinal pigment epithelial mottling, No heme or edema   Vessels attenuated, mild tortuousity attenuated, mild tortuousity   Periphery Attached, oval shaped pigmented lesion at 1030 nevus v CHRPE - stable from prior, shallow schisis ST periphery Attached, shallow schisis with inner retinal holes at 0400            IMAGING AND PROCEDURES  Imaging and Procedures for '@TODAY' @  OCT, Retina - OU - Both Eyes       Right Eye Quality was good. Central Foveal Thickness: 281. Progression has been stable. Findings include normal foveal contour, no SRF, no IRF, vitreomacular adhesion  (Peripheral schisis ST quad caught on widefield -- not imaged today).   Left Eye Quality was good. Central Foveal Thickness: 284. Progression has been stable. Findings include normal foveal contour, no SRF, no IRF, vitreomacular adhesion .   Notes *Images captured and stored on drive  Diagnosis / Impression:  NFP, no IRF/SRF OU VMA OU OD: Peripheral schisis ST quad caught on widefield -- not imaged today   Clinical management:  See below  Abbreviations: NFP - Normal foveal profile. CME - cystoid macular edema. PED - pigment epithelial detachment. IRF - intraretinal fluid. SRF - subretinal fluid. EZ - ellipsoid zone. ERM - epiretinal membrane. ORA - outer retinal atrophy. ORT - outer retinal tubulation. SRHM - subretinal hyper-reflective material        Color Fundus Photography Optos - OU - Both Eyes       Right Eye Progression has been stable. Disc findings include normal observations. Macula : normal observations. Vessels : tortuous vessels, attenuated. Periphery : RPE  abnormality (CHRPE at 1030).   Left Eye Progression has been stable. Disc findings include normal observations. Macula : normal observations. Vessels : tortuous vessels, attenuated. Periphery : (Retinoschisis w/ inner retinal holes at 0400).   Notes **Images stored on drive**  Impression: OD:  Oval CHRPE at 1030 OS:  Retinoschisis w/ inner retinal holes at 0400                 ASSESSMENT/PLAN:    ICD-10-CM   1. Congenital hypertrophy of retinal pigment epithelium  Q14.1 Color Fundus Photography Optos - OU - Both Eyes  2. Left retinoschisis  H33.102 Color Fundus Photography Optos - OU - Both Eyes  3. Retinal edema  H35.81 OCT, Retina - OU - Both Eyes  4. Essential hypertension  I10   5. Hypertensive retinopathy of both eyes  H35.033   6. Combined forms of age-related cataract of both eyes  H25.813     1. CHRPE OD  - flat, well-demarcated, pigmented lesion located at 1030  - no SRF  - repeat optos images obtained today (05.04.22) -- stable from prior  - monitor for now  - f/u 1 year, DFE, OCT, optos colors/FAF  2. Retinoschisis OS  - shallow schisis located at 0400 with inner retinal holes  - no RD and no progression or change from prior  - monitor  - f/u in 1 year, sooner prn  3. No retinal edema on exam or OCT  4,5. Hypertensive retinopathy OU  - discussed importance of tight BP control  - monitor  6. Mixed form age related cataracts  - The symptoms of cataract, surgical options, and treatments and  risks were discussed with patient.  - discussed diagnosis and progression  - under the expert management of Dr. Zenia Resides  - clear from a retina standpoint to proceed with cataract surgery when pt and surgeon are ready    Ophthalmic Meds Ordered this visit:  No orders of the defined types were placed in this encounter.      Return in about 1 year (around 02/06/2022) for f/u CHRPE OD / schisis OS, DFE, OCT.  There are no Patient Instructions on file for this  visit.   Explained the diagnoses, plan, and follow up with the patient and they expressed understanding.  Patient expressed understanding of the importance of proper follow up care.   This document serves as a record of services personally performed by Gardiner Sleeper, MD, PhD. It was created on their behalf by Roselee Nova, COMT. The creation of this record is the provider's dictation and/or activities during the visit.  Electronically signed by: Roselee Nova, COMT 02/08/21 11:38 PM  This document serves as a record of services personally performed by Gardiner Sleeper, MD, PhD. It was created on their behalf by San Jetty. Owens Shark, OA an ophthalmic technician. The creation of this record is the provider's dictation and/or activities during the visit.    Electronically signed by: San Jetty. Owens Shark, [place] 05.04.2022 11:38 PM   Gardiner Sleeper, M.D., Ph.D. Diseases & Surgery of the Retina and Vitreous [facility]  I have reviewed the above documentation for accuracy and completeness, and I agree with the above. Gardiner Sleeper, M.D., Ph.D. 02/08/21 11:38 PM   Abbreviations: M myopia (nearsighted); A astigmatism; H hyperopia (farsighted); P presbyopia; Mrx spectacle prescription;  CTL contact lenses; OD right eye; OS left eye; OU both eyes  XT exotropia; ET esotropia; PEK punctate epithelial keratitis; PEE punctate epithelial erosions; DES dry eye syndrome; MGD meibomian gland dysfunction; ATs artificial tears; PFAT's preservative free artificial tears; [place] nuclear sclerotic cataract; PSC posterior subcapsular cataract; ERM epi-retinal membrane; PVD posterior vitreous detachment; RD retinal detachment; DM diabetes mellitus; DR diabetic retinopathy; NPDR non-proliferative diabetic retinopathy; PDR proliferative diabetic retinopathy; CSME clinically significant macular edema; DME diabetic macular edema; dbh dot blot hemorrhages; CWS cotton wool spot; POAG primary open angle glaucoma;  C/D cup-to-disc ratio; HVF humphrey visual field; GVF goldmann visual field; OCT optical coherence tomography; IOP intraocular pressure; BRVO Branch retinal vein occlusion; CRVO central retinal vein occlusion; CRAO central retinal artery occlusion; BRAO branch retinal artery occlusion; RT retinal tear; SB scleral buckle; PPV pars plana vitrectomy; VH Vitreous hemorrhage; PRP panretinal laser photocoagulation; IVK intravitreal kenalog; VMT vitreomacular traction; MH Macular hole;  NVD neovascularization of the disc; NVE neovascularization elsewhere; AREDS age related eye disease study; ARMD age related macular degeneration; POAG primary open angle glaucoma; EBMD epithelial/anterior basement membrane dystrophy; ACIOL anterior chamber intraocular lens; IOL intraocular lens; PCIOL posterior chamber intraocular lens; Phaco/IOL phacoemulsification with intraocular lens placement; [place] photorefractive keratectomy; LASIK laser assisted in situ keratomileusis; HTN hypertension; DM diabetes mellitus; COPD chronic obstructive pulmonary disease

## 2021-02-06 ENCOUNTER — Ambulatory Visit (INDEPENDENT_AMBULATORY_CARE_PROVIDER_SITE_OTHER): Payer: Medicaid Other | Admitting: Ophthalmology

## 2021-02-06 ENCOUNTER — Other Ambulatory Visit: Payer: Self-pay

## 2021-02-06 ENCOUNTER — Encounter (INDEPENDENT_AMBULATORY_CARE_PROVIDER_SITE_OTHER): Payer: Self-pay | Admitting: Ophthalmology

## 2021-02-06 ENCOUNTER — Other Ambulatory Visit: Payer: Self-pay | Admitting: Family Medicine

## 2021-02-06 DIAGNOSIS — I10 Essential (primary) hypertension: Secondary | ICD-10-CM

## 2021-02-06 DIAGNOSIS — H3581 Retinal edema: Secondary | ICD-10-CM | POA: Diagnosis not present

## 2021-02-06 DIAGNOSIS — H33102 Unspecified retinoschisis, left eye: Secondary | ICD-10-CM | POA: Diagnosis not present

## 2021-02-06 DIAGNOSIS — Q141 Congenital malformation of retina: Secondary | ICD-10-CM | POA: Diagnosis not present

## 2021-02-06 DIAGNOSIS — H25813 Combined forms of age-related cataract, bilateral: Secondary | ICD-10-CM

## 2021-02-06 DIAGNOSIS — H35033 Hypertensive retinopathy, bilateral: Secondary | ICD-10-CM

## 2021-02-08 ENCOUNTER — Encounter (INDEPENDENT_AMBULATORY_CARE_PROVIDER_SITE_OTHER): Payer: Self-pay | Admitting: Ophthalmology

## 2021-02-20 ENCOUNTER — Ambulatory Visit (INDEPENDENT_AMBULATORY_CARE_PROVIDER_SITE_OTHER): Payer: Medicare Other | Admitting: Family Medicine

## 2021-02-20 ENCOUNTER — Encounter: Payer: Self-pay | Admitting: Family Medicine

## 2021-02-20 ENCOUNTER — Other Ambulatory Visit: Payer: Self-pay

## 2021-02-20 VITALS — BP 132/80 | HR 66 | Ht 68.0 in | Wt 180.8 lb

## 2021-02-20 DIAGNOSIS — E119 Type 2 diabetes mellitus without complications: Secondary | ICD-10-CM

## 2021-02-20 DIAGNOSIS — I10 Essential (primary) hypertension: Secondary | ICD-10-CM

## 2021-02-20 DIAGNOSIS — L98499 Non-pressure chronic ulcer of skin of other sites with unspecified severity: Secondary | ICD-10-CM

## 2021-02-20 LAB — POCT GLYCOSYLATED HEMOGLOBIN (HGB A1C): HbA1c, POC (controlled diabetic range): 6.9 % (ref 0.0–7.0)

## 2021-02-20 MED ORDER — DIAZEPAM 5 MG PO TABS: ORAL_TABLET | ORAL | 0 refills | Status: DC

## 2021-02-20 NOTE — Patient Instructions (Signed)
Let me see you in 3 months! 

## 2021-02-21 ENCOUNTER — Encounter: Payer: Self-pay | Admitting: Family Medicine

## 2021-02-21 NOTE — Progress Notes (Signed)
    CHIEF COMPLAINT / HPI: DM follow up. No med problems and taking regulary. Exerecising at [facility]. Wants me to review the work out [employer] on left foot. Has tried OTC remedies without much improvement.  Fu HTN. Taking meds regulary with no problems.No chest pains or LE edema.    PERTINENT  PMH / PSH: I have reviewed the patient's medications, allergies, past medical and surgical history, smoking status and updated in the EMR as appropriate.   OBJECTIVE:  BP 132/80   Pulse 66   Ht 5\' 8"  (1.727 m)   Wt 180 lb 12.8 oz (82 kg)   SpO2 99%   BMI 27.49 kg/m  Vital signs reviewed. GENERAL: Well-developed, well-nourished, no acute distress. CARDIOVASCULAR: Regular rate and rhythm no murmur gallop or rub LUNGS: Clear to auscultation bilaterally, no rales or wheeze. ABDOMEN: Soft positive bowel sounds NEURO: No gross focal neurological deficits. MSK: Movement of extremity x 4.  SKIN lateral left foot plantar portion 1 cm callous with small central open area draining serous type fluid. TTP  ASSESSMENT / PLAN: 1.Corn and callous with small ulcer in DM: refer to podiatry  HYPERTENSION, BENIGN SYSTEMIC Good control Will continue current medication regimen.  Type 2 diabetes mellitus without complications (HCC) Awesome improvement in control. Agree wit exercise plan. Will continue current medication regimen. Recheck 3 m   Annette Mcmurray MD

## 2021-02-21 NOTE — Assessment & Plan Note (Signed)
Good control Will continue current medication regimen.

## 2021-02-21 NOTE — Assessment & Plan Note (Signed)
Awesome improvement in control. Agree wit exercise plan. Will continue current medication regimen. Recheck 3 m

## 2021-02-25 ENCOUNTER — Ambulatory Visit (INDEPENDENT_AMBULATORY_CARE_PROVIDER_SITE_OTHER): Payer: Medicare Other | Admitting: Podiatry

## 2021-02-25 ENCOUNTER — Encounter: Payer: Self-pay | Admitting: Podiatry

## 2021-02-25 ENCOUNTER — Other Ambulatory Visit: Payer: Self-pay

## 2021-02-25 DIAGNOSIS — E119 Type 2 diabetes mellitus without complications: Secondary | ICD-10-CM | POA: Diagnosis not present

## 2021-02-25 DIAGNOSIS — M2141 Flat foot [pes planus] (acquired), right foot: Secondary | ICD-10-CM

## 2021-02-25 DIAGNOSIS — L84 Corns and callosities: Secondary | ICD-10-CM | POA: Diagnosis not present

## 2021-02-25 DIAGNOSIS — M2142 Flat foot [pes planus] (acquired), left foot: Secondary | ICD-10-CM

## 2021-02-25 MED ORDER — UREA 40 % EX CREA: 1.0000 "application " | TOPICAL_CREAM | Freq: Every day | CUTANEOUS | 1 refills | Status: DC

## 2021-02-25 NOTE — Patient Instructions (Signed)
Look for urea 40% cream or ointment and apply to the thickened dry skin / calluses. This can be bought over the counter, at a pharmacy or online such as [employer].  

## 2021-02-25 NOTE — Progress Notes (Signed)
  Subjective:  Patient ID: Annette Rios, female    DOB: Jun 26, 1956,  MRN: [id number]  Chief Complaint  Patient presents with  . Callouses    Painful callus lesion    65 y.o. female presents with the above complaint. History confirmed with patient.  They are quite painful for her and she has been putting corn remover pads on them  Objective:  Physical Exam: warm, good capillary refill, no trophic changes or ulcerative lesions, normal DP and PT pulses and normal sensory exam. She has pes planus deformity bilaterally Left Foot: Submet 1 and submet 5 base hyperkeratotic preulcerative calluses,   Assessment:   1. Pre-ulcerative calluses      Plan:  Patient was evaluated and treated and all questions answered.  Patient educated on diabetes. Discussed proper diabetic foot care and discussed risks and complications of disease. Educated patient in depth on reasons to return to the office immediately should he/she discover anything concerning or new on the feet. All questions answered. Discussed proper shoes as well.   I recommended diabetic multi density insoles and shoes for her.  We will have her scheduled for fitting.  I also discussed that the flip-flop she had on today probably have the strap that is rubbing against the fifth met base lesion.  Recommend she try a slide type sandals with a strap across the top.  All symptomatic hyperkeratoses were safely debrided with a sterile #15 blade to patient's level of comfort without incident. We discussed preventative and palliative care of these lesions including supportive and accommodative shoegear, padding, prefabricated and custom molded accommodative orthoses, use of a pumice stone and lotions/creams daily.   Return in about 3 months (around 05/28/2021) for at risk diabetic foot care.

## 2021-03-07 ENCOUNTER — Telehealth: Payer: Self-pay | Admitting: Podiatry

## 2021-03-07 NOTE — Telephone Encounter (Signed)
Pt called and stated that her insurance company faxed over another type of cream that they would pay for. She was calling to follow up to make sure you received this fax.

## 2021-03-07 NOTE — Telephone Encounter (Signed)
Patient calling to inform Dr. Sherryle Lis that her insurance will not pay for the carmol cream. Patient is under the impression that the pharmacy should be reaching out to Dr. Sherryle Lis to ensure that it is ok for patient to use the cream, or offer for another alternative. Please advise

## 2021-03-12 ENCOUNTER — Telehealth: Payer: Self-pay | Admitting: *Deleted

## 2021-03-12 MED ORDER — AMMONIUM LACTATE 12 % EX CREA: TOPICAL_CREAM | CUTANEOUS | 0 refills | Status: DC | PRN

## 2021-03-12 NOTE — Telephone Encounter (Signed)
error 

## 2021-03-12 NOTE — Telephone Encounter (Signed)
Returned call to patient, insurance will cover Ammonium Lactate if it can be used but rivitaderm is too expensive, has no money. Please advise.

## 2021-03-12 NOTE — Telephone Encounter (Signed)
Returned call to patient and informed that the prescription was sent to pharmacy on file, verbalized understanding.

## 2021-03-12 NOTE — Telephone Encounter (Signed)
Ammonium lactate is fine, sent Rx

## 2021-03-24 ENCOUNTER — Other Ambulatory Visit: Payer: Self-pay | Admitting: Family Medicine

## 2021-04-25 ENCOUNTER — Other Ambulatory Visit: Payer: Self-pay

## 2021-04-26 MED ORDER — ONETOUCH DELICA LANCETS 33G MISC: 12 refills | Status: DC

## 2021-04-26 MED ORDER — ONETOUCH VERIO VI STRP: ORAL_STRIP | 12 refills | Status: DC

## 2021-05-28 ENCOUNTER — Other Ambulatory Visit: Payer: Self-pay

## 2021-05-28 ENCOUNTER — Ambulatory Visit (INDEPENDENT_AMBULATORY_CARE_PROVIDER_SITE_OTHER): Payer: Medicare Other | Admitting: Podiatry

## 2021-05-28 ENCOUNTER — Encounter: Payer: Self-pay | Admitting: Podiatry

## 2021-05-28 DIAGNOSIS — E119 Type 2 diabetes mellitus without complications: Secondary | ICD-10-CM

## 2021-05-28 DIAGNOSIS — L84 Corns and callosities: Secondary | ICD-10-CM | POA: Diagnosis not present

## 2021-06-01 NOTE — Progress Notes (Signed)
  Subjective:  Patient ID: Annette Rios, female    DOB: 03/18/1956,  MRN: [id number]  Annette Rios presents to clinic today for preventative diabetic foot care and callus(es) of both feet. Aggravating factors include weightbearing with and without shoe gear. Pain is relieved with periodic professional debridement.  Patient is not required to monitor blood glucose daily.Her last A1c was 6.9%.  PCP is Dickie La, MD , and last visit was 02/20/2021.  Allergies  Allergen Reactions   Penicillins Rash and Other (See Comments)    PATIENT HAS HAD A PCN REACTION WITH IMMEDIATE RASH, FACIAL/TONGUE/THROAT SWELLING, SOB, OR LIGHTHEADEDNESS WITH HYPOTENSION:  #  #  #  YES  #  #  #   Has patient had a PCN reaction causing severe rash involving mucus membranes or skin necrosis: NO Has patient had a PCN reaction that required hospitalization NO Has patient had a PCN reaction occurring within the last 10 years: NO If all of the above answers are "NO", then may proceed with Cephalosporin use.    Review of Systems: Negative except as noted in the HPI. Objective:   Constitutional Annette Rios is a pleasant 65 y.o. African American female, WD, WN in NAD. AAO x 3.   Vascular Capillary refill time to digits immediate b/l. Palpable pedal pulses b/l LE. Pedal hair present. Lower extremity skin temperature gradient within normal limits. No pain with calf compression b/l. No edema noted b/l lower extremities. No cyanosis or clubbing noted.  Neurologic Normal speech. Oriented to person, place, and time. Protective sensation intact 5/5 intact bilaterally with 10g monofilament b/l. Vibratory sensation intact b/l.  Dermatologic Pedal skin with normal turgor, texture and tone b/l lower extremities. No open wounds b/l lower extremities. Toenails 1-5 bilaterally well maintained with adequate length. No erythema, no edema, no drainage, no fluctuance. Preulcerative lesion noted submet head 1 left foot, submet head  1 right foot, and sub 5th met base left foot. There is visible subdermal hemorrhage. There is no surrounding erythema, no edema, no drainage, no odor, no fluctuance.  Orthopedic: Normal muscle strength 5/5 to all lower extremity muscle groups bilaterally. Pes planus deformity noted b/l lower extremities.   Radiographs: None Assessment:   1. Pre-ulcerative calluses   2. Type 2 diabetes mellitus without complication, without long-term current use of insulin ([place])    Plan:  -Examined patient. -Continue diabetic foot care principles: inspect feet daily, monitor glucose as recommended by PCP and/or Endocrinologist, and follow prescribed diet per PCP, Endocrinologist and/or dietician. -Patient to continue soft, supportive shoe gear daily. -Preulcerative lesion pared submet head 1 left foot, submet head 1 right foot, and sub 5th met base left foot. Total number pared=3. -Patient to report any pedal injuries to medical professional immediately. -Patient/POA to call should there be question/concern in the interim.  Return in about 3 months (around 08/28/2021).  Marzetta Board, DPM

## 2021-06-19 ENCOUNTER — Telehealth: Payer: Self-pay | Admitting: *Deleted

## 2021-06-19 DIAGNOSIS — E119 Type 2 diabetes mellitus without complications: Secondary | ICD-10-CM

## 2021-06-19 DIAGNOSIS — H259 Unspecified age-related cataract: Secondary | ICD-10-CM

## 2021-06-19 NOTE — Telephone Encounter (Signed)
Insurance called on patient's behalf asking for a new referral for ophthalmology.  Patient was seeing Dr. Katy Fitch in the past but they are no longer in network with patient's plan.  Referral will be sent to [facility] for cataract evaluation and treatment.  Will forward to MD to place a new referral.  Bilal Manzer,CMA

## 2021-08-05 ENCOUNTER — Telehealth: Payer: Self-pay

## 2021-08-05 NOTE — Telephone Encounter (Signed)
Patient calls nurse line stating she has not been able to pick up testing strips or lancets. #12 refills is on July prescription. I called the pharmacy and they will get supplies ready for patients.

## 2021-08-10 ENCOUNTER — Other Ambulatory Visit: Payer: Self-pay | Admitting: Family Medicine

## 2021-08-14 ENCOUNTER — Encounter: Payer: Self-pay | Admitting: Family Medicine

## 2021-08-14 ENCOUNTER — Ambulatory Visit (INDEPENDENT_AMBULATORY_CARE_PROVIDER_SITE_OTHER): Payer: Medicare Other | Admitting: Family Medicine

## 2021-08-14 ENCOUNTER — Other Ambulatory Visit: Payer: Self-pay

## 2021-08-14 VITALS — BP 145/78 | HR 80 | Ht 68.0 in | Wt 184.6 lb

## 2021-08-14 DIAGNOSIS — I10 Essential (primary) hypertension: Secondary | ICD-10-CM | POA: Diagnosis not present

## 2021-08-14 DIAGNOSIS — E7800 Pure hypercholesterolemia, unspecified: Secondary | ICD-10-CM | POA: Diagnosis not present

## 2021-08-14 DIAGNOSIS — E119 Type 2 diabetes mellitus without complications: Secondary | ICD-10-CM | POA: Diagnosis not present

## 2021-08-14 LAB — POCT GLYCOSYLATED HEMOGLOBIN (HGB A1C): HbA1c, POC (controlled diabetic range): 6.7 % (ref 0.0–7.0)

## 2021-08-14 MED ORDER — ROSUVASTATIN CALCIUM 20 MG PO TABS: 20.0000 mg | ORAL_TABLET | Freq: Every day | ORAL | 3 refills | Status: DC

## 2021-08-14 NOTE — Assessment & Plan Note (Signed)
Continue her current medication regimen.  We will check kidney function and electrolytes today.

## 2021-08-14 NOTE — Patient Instructions (Addendum)
.  fpcPsychiatry Resource List (Adults and Children) Most of these providers will take Medicaid. please consult your insurance for a complete and updated list of available providers. When calling to make an appointment have your insurance information available to confirm you are covered.   [facility]:Psychiatry and Counseling [address] [place], [place] [address] [phone]  [facility]  [place], [place]:   [facility]: [address]     [phone]   Linna Hoff: [place]. [place],        [phone] [place]: [place],    [place]: [phone] [phone],                   [phone] Children: [place] [address]         [phone]  [place] (virtual only) [phone]   [facility]  (Psychiatry only; Adults /children 12 and over, will take Medicaid)  [place], [place], [place] [address]       [phone]   [place] (Psychiatry & counseling ; adults & children ; will take Medicaid [address]  [address]  [place] [address]  Go on-line to complete referral ( [email or web address]    (Spanish speaking therapists)  [facility]  Psychiatry & counseling; Adults and children;  Call Registration prior to scheduling an appointment [phone]    [place], [place] [address]    [phone]  [facility] (Psychiatry & counseling; adults & children; Medicare no Medicaid)  [place], [place]  [address]      [phone]    [facility] (up to age 50)  Psychiatry & counseling ,will take Medicaid, must do counseling to receive psychiatry services  [address] [place] [address]        ([place] (Psychiatry & counseling; adults & children; will take Medicaid) Will need a referral from provider [address],  [place], [place]  [phone]   [facility] --- Walk-In Mon-Friday 8am-3pm ( will take Medicaid, Psychiatry, Adults & children,  [address] [place], [place]   [phone]   [facility] [place]--, Walk-in M-F 8am-12pm and 1pm -3pm   (Counseling, Psychiatry, will take Medicaid, adults & children)  [address], [place], [place]  [phone]

## 2021-08-14 NOTE — Assessment & Plan Note (Signed)
We will try switching her to Crestor.  She wants to finish up the pill she has, of the atorvastatin, and says she will crush those in applesauce and then start the atorvastatin.

## 2021-08-14 NOTE — Progress Notes (Addendum)
    CHIEF COMPLAINT / HPI: #1 hypertension: Has been under a little bit more stress recently because the granddaughter had been diagnosed with diabetes.  Overall she is feeling well.  No chest pains, no headaches, no lower extremity edema.  Taking her medicines regularly.  No change in exercise tolerance and she continues to exercise 4-6 times a week at the [facility] 2.  Hyperlipidemia: We had switched her to atorvastatin which was fine except the pills are very large.  She would like to try something different because they seem to be difficult for her to swallow.  She does not have any other problems swallowing anything else. #3.  Follow-up diabetes mellitus: She has been taking her medicines extremely regularly.  She is not checking her blood sugars very often but has had no episodes of low blood sugar.   PERTINENT  PMH / PSH: I have reviewed the patient's medications, allergies, past medical and surgical history, smoking status and updated in the EMR as appropriate.   OBJECTIVE:  BP (!) 145/78   Pulse 80   Ht 5\' 8"  (1.727 m)   Wt 184 lb 9.6 oz (83.7 kg)   SpO2 98%   BMI 28.07 kg/m  Vital signs reviewed. GENERAL: Well-developed, well-nourished, no acute distress. CARDIOVASCULAR: Regular rate and rhythm no murmur gallop or rub LUNGS: Clear to auscultation bilaterally, no rales or wheeze. ABDOMEN: Soft positive bowel sounds NEURO: No gross focal neurological deficits. MSK: Movement of extremity x 4.   ASSESSMENT / PLAN:   HYPERCHOLESTEROLEMIA We will try switching her to Crestor.  She wants to finish up the pill she has, of the atorvastatin, and says she will crush those in applesauce and then start the atorvastatin.  HYPERTENSION, BENIGN SYSTEMIC Continue her current medication regimen.  We will check kidney function and electrolytes today.  Type 2 diabetes mellitus without complications (HCC) HerDoing much better with her A1c stable at less than 7.  We will continue current  medications without change.  She is applauded continuing her regular exercise regimen.  Follow-up 6 months, sooner with problems. At risk for preulcerative callous due to foot deformities of pes planus and first ray bunion formation.   Dorcas Mcmurray MD

## 2021-08-14 NOTE — Assessment & Plan Note (Addendum)
HerDoing much better with her A1c stable at less than 7.  We will continue current medications without change.  She is applauded continuing her regular exercise regimen.  Follow-up 6 months, sooner with problems. At risk for preulcerative callous due to foot deformities of pes planus and first ray bunion formation.

## 2021-08-15 LAB — BASIC METABOLIC PANEL
BUN/Creatinine Ratio: 14 (ref 12–28)
BUN: 10 mg/dL (ref 8–27)
CO2: 26 mmol/L (ref 20–29)
Calcium: 10.1 mg/dL (ref 8.7–10.3)
Chloride: 101 mmol/L (ref 96–106)
Creatinine, Ser: 0.7 mg/dL (ref 0.57–1.00)
Glucose: 137 mg/dL — ABNORMAL HIGH (ref 70–99)
Potassium: 4.3 mmol/L (ref 3.5–5.2)
Sodium: 143 mmol/L (ref 134–144)
eGFR: 95 mL/min/{1.73_m2} (ref >59)

## 2021-08-15 LAB — LDL CHOLESTEROL, DIRECT: LDL Direct: 51 mg/dL (ref 0–99)

## 2021-08-16 ENCOUNTER — Encounter: Payer: Self-pay | Admitting: Family Medicine

## 2021-08-30 ENCOUNTER — Other Ambulatory Visit: Payer: Self-pay | Admitting: Family Medicine

## 2021-09-03 ENCOUNTER — Ambulatory Visit (INDEPENDENT_AMBULATORY_CARE_PROVIDER_SITE_OTHER): Payer: Medicare Other | Admitting: Podiatry

## 2021-09-03 ENCOUNTER — Other Ambulatory Visit: Payer: Self-pay

## 2021-09-03 ENCOUNTER — Encounter: Payer: Self-pay | Admitting: Podiatry

## 2021-09-03 DIAGNOSIS — E119 Type 2 diabetes mellitus without complications: Secondary | ICD-10-CM | POA: Diagnosis not present

## 2021-09-03 DIAGNOSIS — M2142 Flat foot [pes planus] (acquired), left foot: Secondary | ICD-10-CM

## 2021-09-03 DIAGNOSIS — M2141 Flat foot [pes planus] (acquired), right foot: Secondary | ICD-10-CM

## 2021-09-03 DIAGNOSIS — L84 Corns and callosities: Secondary | ICD-10-CM | POA: Diagnosis not present

## 2021-09-03 NOTE — Progress Notes (Signed)
Patient:   Annette Rios   Date:    09/04/2021  MRN:  [id number]  HISTORY OF PRESENT ILLNESS: Annette Rios is a 65 y.o. with Type 2 Diabetes diagnosed about one year ago per patient recollection. She is a seen for annual foot examination.  She is referred by Dr. Dorcas Mcmurray.  She is alone for today's appointment.     Do you have any pain in your calf muscles when walking?  No Do you have any pain in your foot or feet?  No. Have you had any problems with your feet since the last visit?  No Do you have any sores on your feet?  No.  Have you noticed any blood or discharge on your socks or hose?  No Does patient wear shoes that are closed toe, low heel, adequate toe room (no pointed toe), and breathable material like leather, canvas or suede?  No  PAST DIABETIC HISTORY:  History of amputations:  No. Previous foot ulcer:  No.  CURRENT MEDICATIONS: Current Outpatient Medications  Medication Sig Dispense Refill   ACCU-CHEK GUIDE test strip PLEASE USE TO CHECK BLOOD SUGAR THREE TIMES DAILY 100 strip 11   amitriptyline (ELAVIL) 10 MG tablet TAKE 1 TABLET BY MOUTH AT BEDTIME FOR MUSCLE SPASM. MAY MAKE YOU SLEEPY 30 tablet 3   amLODipine (NORVASC) 5 MG tablet Take 1 tablet (5 mg total) by mouth daily. 90 tablet 3   AMLODIPINE-ATORVASTATIN PO SMARTSIG:1 Tablet(s) By Mouth Daily     ammonium lactate (AMLACTIN) 12 % cream Apply topically as needed for dry skin. 385 g 0   Blood Glucose Monitoring Suppl (ONETOUCH VERIO) w/Device KIT Use to check blood sugars 3x per day. E11.9 1 kit 0   diazepam (VALIUM) 5 MG tablet Take one by mouth prior to airplane travel and may repeat once in 4 hours 10 tablet 0   diclofenac sodium (VOLTAREN) 1 % GEL Apply 2 g topically 4 (four) times daily. 100 g 5   esomeprazole (NEXIUM) 40 MG capsule Take 1 capsule (40 mg total) by mouth daily as needed (for acid reflux). 90 capsule 3   indapamide (LOZOL) 1.25 MG tablet Take 1 tablet (1.25 mg total) by mouth daily. 90  tablet 3   metFORMIN (GLUCOPHAGE-XR) 500 MG 24 hr tablet TAKE 1 TABLET BY MOUTH EVERY DAY WITH BREAKFAST FOR 1 WEEK, THEN INCREASE TO 2 TABLETS DAILY WITH BREAKFAST THEREAFTER. 180 tablet 3   OneTouch Delica Lancets 78L MISC Please use to check blood sugar three times per day. 100 each 12   rosuvastatin (CRESTOR) 20 MG tablet Take 1 tablet (20 mg total) by mouth daily. 90 tablet 3   traMADol (ULTRAM) 50 MG tablet TAKE 2 TABLETS BY MOUTH TWICE DAILY AS NEEDED FOR PAIN 120 tablet 5   No current facility-administered medications for this visit.     OBJECTIVE/FOOT EXAMINATION:  Foot:    No open wounds. No interdigital macerations. No scaling. Skin:    normal exam; no erythema, swelling or tenderness. normal turgor. Hyperkeratotic lesion(s) submet head 1 b/l and submet head 5 left foot. No edema, no erythema, no drainage, no fluctuance.  Nails:  onychomycosis, thickening, recently trimmed by paitent Sensory: normal to light touch bilaterally Other:  General appearance: no acute distress, alert/oriented x3, and appropriate mood and affect  Neurological Exam: normal and protective sensation with monofilament intact bilaterally and symmetrically. Vascular Exam: normal, pulse present, Tibialis posterior pulse: present, Dorsalis pedis pulse: present, and hair pattern: normal.  Right Pulses:  present Left Pulses: present  Pes planus foot deformity noted b/l. She is wearing flip flops which are not supportive.  RISK CATEGORIZATION: Low Risk Intact protective sensation - pedal pulses present No deformity; no prior foot ulcer(s) No amputation(s)  DIAGNOSIS: 1. Callus   2. Type 2 diabetes mellitus without complication, without long-term current use of insulin ([place])   3. Pes planus of both feet   4. Encounter for diabetic foot exam ([place])     PLAN: -Examined patient. -Discussed diabetic shoes. Patient qualifies based on diagnoses. She declines on today. Discussed inappropriate flip flop shoe  gear today. States she has [employer]chers shoes. -Medicaid ABN signed for this year. Patient refuses services of paring of calluses today. She was instructed to file with pumice stone after bath or shower once weekly. Copy of ABN refusal has been placed in patient chart. -Diabetic foot examination performed today. -Patient/POA to call should there be question/concern in the interim.  -Follow up 3 months.

## 2021-09-04 ENCOUNTER — Ambulatory Visit: Payer: Medicare Other | Admitting: Podiatry

## 2021-09-04 ENCOUNTER — Telehealth: Payer: Self-pay | Admitting: Podiatry

## 2021-09-04 NOTE — Telephone Encounter (Signed)
Patient called and stated that she is interested in diabetic shoes, so she would like an appointment next year with Aaron Edelman. She know that she told Dr. Elisha Ponder that she was not interested but she changed her mind.

## 2021-09-05 ENCOUNTER — Telehealth: Payer: Self-pay

## 2021-09-05 NOTE — Telephone Encounter (Addendum)
Received fax from [facility]. Pt insurance now prefers One Administrator, arts Plus Lancets and One Wellsite geologist. Please send new Rx. Ottis Stain, CMA

## 2021-09-09 MED ORDER — ONETOUCH DELICA LANCETS 33G MISC: 12 refills | Status: AC

## 2021-09-09 MED ORDER — ONETOUCH VERIO W/DEVICE KIT: PACK | 0 refills | Status: AC

## 2021-09-09 NOTE — Addendum Note (Signed)
Addended byDorcas Mcmurray L on: 09/09/2021 11:37 AM   Modules accepted: Orders

## 2021-09-12 ENCOUNTER — Other Ambulatory Visit: Payer: Self-pay | Admitting: Family Medicine

## 2021-09-12 ENCOUNTER — Telehealth: Payer: Self-pay | Admitting: Podiatry

## 2021-09-12 NOTE — Telephone Encounter (Signed)
Discussed diabetic shoes and how her medicare will cover 80% and she will have a 20% coinsurance, also mentioned she may have the deductible with it being the beginning of the year. She is now scheduled to come in and see Aaron Edelman on 1.6.2023 for diabetic shoes.

## 2021-10-10 ENCOUNTER — Ambulatory Visit: Payer: Medicare Other

## 2021-10-16 ENCOUNTER — Ambulatory Visit: Payer: Medicare Other

## 2021-10-16 ENCOUNTER — Other Ambulatory Visit: Payer: Self-pay

## 2021-10-16 ENCOUNTER — Telehealth: Payer: Self-pay | Admitting: Family Medicine

## 2021-10-16 ENCOUNTER — Telehealth: Payer: Self-pay

## 2021-10-16 DIAGNOSIS — E119 Type 2 diabetes mellitus without complications: Secondary | ICD-10-CM

## 2021-10-16 DIAGNOSIS — M2141 Flat foot [pes planus] (acquired), right foot: Secondary | ICD-10-CM

## 2021-10-16 DIAGNOSIS — M2142 Flat foot [pes planus] (acquired), left foot: Secondary | ICD-10-CM

## 2021-10-16 NOTE — Telephone Encounter (Signed)
Clinical info completed on Footwear form.  Place form in Dr. Verlon Au box for completion.  Joron Velis Zimmerman Rumple, CMA

## 2021-10-16 NOTE — Progress Notes (Signed)
SITUATION Reason for Consult: Evaluation for Prefabricated Diabetic Shoes and Bilateral Custom Diabetic Inserts. Patient / Caregiver Report: Patient would like well fitting shoes  OBJECTIVE DATA: Patient History / Diagnosis:    ICD-10-CM   1. Type 2 diabetes mellitus without complication, without long-term current use of insulin (HCC)  E11.9     2. Pes planus of both feet  M21.41    M21.42       Current or Previous Devices:   None and no history  In-Person Foot Examination: Ulcers & Callousing:   Callousing historical 1st met bilateral  Toe / Foot Deformities:   - Pes Planus / Cavus - Hindfoot Varus / Valgus - Forefoot ABduction / ADduction  - Hammertoes - Crossover toes - Midfoot collapse - Charcot Deformity   Shoe Size: 9.62M  ORTHOTIC RECOMMENDATION Recommended Devices: - 1x pair prefabricated PDAC approved diabetic shoes: X527W 9.62M - 3x pair custom-to-patient vacuum formed diabetic insoles.   GOALS OF SHOES AND INSOLES - Reduce shear and pressure - Reduce / Prevent callus formation - Reduce / Prevent ulceration - Protect the fragile healing compromised diabetic foot.  Patient would benefit from diabetic shoes and inserts as patient has diabetes mellitus and the patient has one or more of the following conditions: - History of partial or complete amputation of the foot - History of previous foot ulceration. - History of pre-ulcerative callus - Peripheral neuropathy with evidence of callus formation - Foot deformity - Poor circulation  ACTIONS PERFORMED Patient was casted for insoles via crush box and measured for shoes via brannock device. Procedure was explained and patient tolerated procedure well. All questions were answered and concerns addressed.  PLAN Patient is to ensure treating physician receives and completes diabetic paperwork. Casts and shoe order are to be held until paperwork is received. Once received patient is to be scheduled for fitting in  four weeks.

## 2021-10-16 NOTE — Telephone Encounter (Signed)
Patient took the paperwork you gave her this morning to her PCP, they will call her when it is completed and she will bring it back to you.

## 2021-10-16 NOTE — Telephone Encounter (Signed)
Patient dropped off form to be filled out by doctor for therapeutic footwear.  Once completed patient will pick up

## 2021-10-28 NOTE — Telephone Encounter (Signed)
Faxed

## 2021-12-03 ENCOUNTER — Telehealth: Payer: Self-pay

## 2021-12-03 NOTE — Telephone Encounter (Signed)
Casts sent to [employer] - HOLD FOR CMN

## 2021-12-13 ENCOUNTER — Other Ambulatory Visit: Payer: Self-pay | Admitting: Family Medicine

## 2021-12-13 ENCOUNTER — Encounter: Payer: Self-pay | Admitting: Podiatry

## 2021-12-13 ENCOUNTER — Other Ambulatory Visit: Payer: Self-pay

## 2021-12-13 ENCOUNTER — Ambulatory Visit (INDEPENDENT_AMBULATORY_CARE_PROVIDER_SITE_OTHER): Payer: Medicare Other | Admitting: Podiatry

## 2021-12-13 DIAGNOSIS — L84 Corns and callosities: Secondary | ICD-10-CM

## 2021-12-13 DIAGNOSIS — E119 Type 2 diabetes mellitus without complications: Secondary | ICD-10-CM

## 2021-12-22 NOTE — Progress Notes (Signed)
?  Subjective:  ?Patient ID: Annette Rios, female    DOB: [birth year],  MRN: [id number] ? ?Annette Rios presents to clinic today for preventative diabetic foot care and callus(es) b/l lower extremities. Aggravating factors include weightbearing with and without shoe gear. Pain is relieved with periodic professional debridement. ? ?Patient did not check blood glucose today. ? ?New problem(s): None.  ? ?PCP is Dickie La, MD , and last visit was August 14, 2021. ? ?Allergies  ?Allergen Reactions  ? Penicillins Rash and Other (See Comments)  ?  PATIENT HAS HAD A PCN REACTION WITH IMMEDIATE RASH, FACIAL/TONGUE/THROAT SWELLING, SOB, OR LIGHTHEADEDNESS WITH HYPOTENSION:  #  #  #  YES  #  #  #   ?Has patient had a PCN reaction causing severe rash involving mucus membranes or skin necrosis: NO ?Has patient had a PCN reaction that required hospitalization NO ?Has patient had a PCN reaction occurring within the last 10 years: NO ?If all of the above answers are "NO", then may proceed with Cephalosporin use.  ? ? ?Review of Systems: Negative except as noted in the HPI. ? ?Objective: No changes noted in today's physical examination. ? ?Constitutional Annette Rios is a pleasant 66 y.o. African American female, WD, WN in NAD. AAO x 3.   ?Vascular Capillary refill time to digits immediate b/l. Palpable pedal pulses b/l LE. Pedal hair present. Lower extremity skin temperature gradient within normal limits. No pain with calf compression b/l. No edema noted b/l lower extremities. No cyanosis or clubbing noted.  ?Neurologic Normal speech. Oriented to person, place, and time. Protective sensation intact 5/5 intact bilaterally with 10g monofilament b/l. Vibratory sensation intact b/l.  ?Dermatologic Pedal skin with normal turgor, texture and tone b/l lower extremities. No open wounds b/l lower extremities. Toenails 1-5 bilaterally well maintained with adequate length. No erythema, no edema, no drainage, no fluctuance.  Preulcerative lesion noted submet head 1 left foot, submet head 1 right foot, and sub 5th met base left foot. There is visible subdermal hemorrhage. There is no surrounding erythema, no edema, no drainage, no odor, no fluctuance.  ?Orthopedic: Normal muscle strength 5/5 to all lower extremity muscle groups bilaterally. Pes planus deformity noted b/l lower extremities.  ? ?Radiographs: None ?Hemoglobin A1C Latest Ref Rng & Units 08/14/2021 02/20/2021  ?HGBA1C 0.0 - 7.0 % 6.7 6.9  ?Some recent data might be hidden  ? ?Assessment/Plan: ?1. Pre-ulcerative calluses   ?2. Type 2 diabetes mellitus without complication, without long-term current use of insulin ([place])   ?  ?-Patient was evaluated and treated. All patient's and/or POA's questions/concerns answered on today's visit. ?-She was measured and casted for diabetic shoes/inserts on October 16, 2021. Awaiting signed certification form from PCP managing her diabetes. ?-Medicaid ABN signed for this year. Patient consents for services of paring of calluses on today. Copy has been placed in patient chart. ?-Preulcerative lesion pared submet head 1 b/l and submet head 5 left foot. Total number pared=3. ?-Patient/POA to call should there be question/concern in the interim.  ? ?Return in about 3 months (around 03/15/2022). ? ?Marzetta Board, DPM  ?

## 2022-01-03 ENCOUNTER — Encounter: Payer: Self-pay | Admitting: Family Medicine

## 2022-01-03 NOTE — Progress Notes (Signed)
3rd time I am faxing copy for certificate of need for her diabetic shoes to Dr. Elisha Ponder ?Date of office visit was 08/14/2022 ?

## 2022-01-06 ENCOUNTER — Telehealth: Payer: Self-pay

## 2022-01-06 NOTE — Telephone Encounter (Signed)
Shoes Ordered - Apex Women - Boss Runner Silver/Sea Blue [phone] 9.12M ?

## 2022-01-15 ENCOUNTER — Ambulatory Visit (INDEPENDENT_AMBULATORY_CARE_PROVIDER_SITE_OTHER): Payer: Medicare Other

## 2022-01-15 DIAGNOSIS — M2142 Flat foot [pes planus] (acquired), left foot: Secondary | ICD-10-CM

## 2022-01-15 DIAGNOSIS — L84 Corns and callosities: Secondary | ICD-10-CM

## 2022-01-15 DIAGNOSIS — E119 Type 2 diabetes mellitus without complications: Secondary | ICD-10-CM

## 2022-01-15 DIAGNOSIS — M2141 Flat foot [pes planus] (acquired), right foot: Secondary | ICD-10-CM

## 2022-01-15 NOTE — Progress Notes (Signed)
SITUATION ?Reason for Visit: Fitting of Diabetic [place] ?Patient / Caregiver Report:  Patient is satisfied with fit and function of shoes and insoles. ? ?OBJECTIVE DATA: ?Patient History / Diagnosis:   ?  ICD-10-CM   ?1. Type 2 diabetes mellitus without complication, without long-term current use of insulin (HCC)  E11.9   ?  ?2. Pes planus of both feet  M21.41   ? M21.42   ?  ?3. Callus  L84   ?  ? ? ?Change in Status:   None ? ?ACTIONS PERFORMED: ?In-Person Delivery, patient was fit with: ?- 1x pair [id number] PDAC approved prefabricated Diabetic Shoes: Apex X527W 9.10M ?- 3x pair V4008 PDAC approved vacuum formed custom diabetic insoles; [employer]: [id number] ? ?Shoes and insoles were verified for structural integrity and safety. Patient wore shoes and insoles in office. Skin was inspected and free of areas of concern after wearing shoes and inserts. Shoes and inserts fit properly. Patient / Caregiver provided with ferbal instruction and demonstration regarding donning, doffing, wear, care, proper fit, function, purpose, cleaning, and use of shoes and insoles ' and in all related precautions and risks and benefits regarding shoes and insoles. Patient / Caregiver was instructed to wear properly fitting socks with shoes at all times. Patient was also provided with verbal instruction regarding how to report any failures or malfunctions of shoes or inserts, and necessary follow up care. Patient / Caregiver was also instructed to contact physician regarding change in status that may affect function of shoes and inserts.  ? ?Patient / Caregiver verbalized undersatnding of instruction provided. Patient / Caregiver demonstrated independence with proper donning and doffing of shoes and inserts. ? ?PLAN ?Patient to follow with treating physician as recommended. Plan of care was discussed with and agreed upon by patient and/or caregiver. All questions were answered and concerns addressed. ? ?

## 2022-01-16 ENCOUNTER — Ambulatory Visit: Payer: Medicare Other

## 2022-01-16 DIAGNOSIS — M2142 Flat foot [pes planus] (acquired), left foot: Secondary | ICD-10-CM

## 2022-01-16 DIAGNOSIS — L84 Corns and callosities: Secondary | ICD-10-CM

## 2022-01-16 DIAGNOSIS — E119 Type 2 diabetes mellitus without complications: Secondary | ICD-10-CM

## 2022-01-16 NOTE — Progress Notes (Signed)
SITUATION ?Reason for Consult: Follow-up with diabetic shoes and insoles ?Patient / Caregiver Report: Patient reports the shoes are too heavy and she cannot perform on her walking track adequately ? ?OBJECTIVE DATA ?History / Diagnosis:  ?  ICD-10-CM   ?1. Type 2 diabetes mellitus without complication, without long-term current use of insulin (HCC)  E11.9   ?  ?2. Pes planus of both feet  M21.41   ? M21.42   ?  ?3. Callus  L84   ?  ? ? ?Change in Pathology: None ? ?ACTIONS PERFORMED ?Patient's equipment was checked for structural stability and fit. Took Apex [id number] for return. Patient selected Shon Millet Black [id number] in exchange. Ordered replacement shoes. All questions answered and concerns addressed. ? ?PLAN ?Patient to be called when shoes ready for pickup. Plan of care discussed with and agreed upon by patient / caregiver. ? ?

## 2022-02-05 ENCOUNTER — Encounter (INDEPENDENT_AMBULATORY_CARE_PROVIDER_SITE_OTHER): Payer: Medicaid Other | Admitting: Ophthalmology

## 2022-02-12 ENCOUNTER — Ambulatory Visit: Payer: Medicare Other | Admitting: Family Medicine

## 2022-02-12 ENCOUNTER — Encounter: Payer: Self-pay | Admitting: Family Medicine

## 2022-02-12 ENCOUNTER — Ambulatory Visit (INDEPENDENT_AMBULATORY_CARE_PROVIDER_SITE_OTHER): Payer: Medicare Other | Admitting: Family Medicine

## 2022-02-12 VITALS — BP 126/72 | HR 62 | Ht 68.0 in | Wt 176.6 lb

## 2022-02-12 DIAGNOSIS — E7800 Pure hypercholesterolemia, unspecified: Secondary | ICD-10-CM

## 2022-02-12 DIAGNOSIS — I10 Essential (primary) hypertension: Secondary | ICD-10-CM | POA: Diagnosis not present

## 2022-02-12 DIAGNOSIS — E119 Type 2 diabetes mellitus without complications: Secondary | ICD-10-CM

## 2022-02-12 LAB — POCT GLYCOSYLATED HEMOGLOBIN (HGB A1C): HbA1c, POC (controlled diabetic range): 6.4 % (ref 0.0–7.0)

## 2022-02-12 MED ORDER — METFORMIN HCL ER 500 MG PO TB24: ORAL_TABLET | ORAL | 3 refills | Status: DC

## 2022-02-12 NOTE — Assessment & Plan Note (Signed)
Good control.  We will continue current medication and follow-up 3 months. ?

## 2022-02-12 NOTE — Assessment & Plan Note (Signed)
Much improved A1c.  She is adamant that she wants to get off the metformin but does not want to try any kind of injectable.  We made a compromise to decrease her metformin to 1 tablet a day and with follow-up in 3 months. ?

## 2022-02-12 NOTE — Patient Instructions (Signed)
We are decreasing your metformin dose to ONE TAB a day but be sure and take it every day. See me back in 3 months. ?

## 2022-02-12 NOTE — Assessment & Plan Note (Signed)
Tolerating the switch in medication well.  No issues.  We will continue current dosing. ?

## 2022-02-12 NOTE — Progress Notes (Signed)
? ? ?  CHIEF COMPLAINT / HPI: ?Follow-up diabetes mellitus: Does not check her blood sugar but is not having episodes of low blood sugar.  She is reluctantly taking both of her metformin every day but says she really has trouble swallowing pills with no if there is some smaller pill that she can be taking ?2.  We switched her cholesterol medicine and she is tolerating the new 1 much better.  It is a much smaller pill. ?3.  Hypertension: Taking her medicines regularly without problem. ? ? ?PERTINENT  PMH / PSH: I have reviewed the patient?s medications, allergies, past medical and surgical history, smoking status and updated in the EMR as appropriate. ? ? ?OBJECTIVE: ? BP 126/72   Pulse 62   Ht '5\' 8"'$  (1.727 m)   Wt 176 lb 9.6 oz (80.1 kg)   SpO2 97%   BMI 26.85 kg/m?  ?Vital signs reviewed. ?GENERAL: Well-developed, well-nourished, no acute distress. ?CARDIOVASCULAR: Regular rate and rhythm no murmur gallop or rub ?LUNGS: Clear to auscultation bilaterally, no rales or wheeze. ?NEURO: No gross focal neurological deficits. ?MSK: Movement of extremity x 4. ? ? ?ASSESSMENT / PLAN: ? ? ?Type 2 diabetes mellitus without complications ([place]) ?Much improved A1c.  She is adamant that she wants to get off the metformin but does not want to try any kind of injectable.  We made a compromise to decrease her metformin to 1 tablet a day and with follow-up in 3 months. ? ?HYPERTENSION, BENIGN SYSTEMIC ?Good control.  We will continue current medication and follow-up 3 months. ? ?HYPERCHOLESTEROLEMIA ?Tolerating the switch in medication well.  No issues.  We will continue current dosing. ?  ?Dorcas Mcmurray MD ?

## 2022-02-17 NOTE — Progress Notes (Signed)
[facility] Note  02/21/2022     CHIEF COMPLAINT Patient presents for Retina Follow Up   HISTORY OF PRESENT ILLNESS: Annette Rios is a 66 y.o. female who presents to the clinic today for:   HPI     Retina Follow Up   Patient presents with  Other.  In right eye.  This started 1 year ago.  I, the attending physician,  performed the HPI with the patient and updated documentation appropriately.        Comments   Patient here for 1 year retina follow up for CHRPE OD, retinoschisis OS. Patient states vision still blurry and still sees black specks. Sometimes has eye pain.       Last edited by Bernarda Caffey, MD on 02/25/2022 10:55 PM.     pt states her vision looks blurry and she is seeing floaters, no new health concerns   Referring physician: Debbra Riding, MD [address] [place],  [place] [address]  HISTORICAL INFORMATION:   Selected notes from the MEDICAL RECORD NUMBER Referred by Dr. Wyatt Portela for concern of a nevus OD LEE: (S. Groat) [BCVA: OD: OS:] Ocular Hx-cataract OD PMH-arthritis, borderline diabetic, headaches, heart murmur, HLD, HTN    CURRENT MEDICATIONS: No current outpatient medications on file. (Ophthalmic Drugs)   No current facility-administered medications for this visit. (Ophthalmic Drugs)   Current Outpatient Medications (Other)  Medication Sig   ACCU-CHEK GUIDE test strip PLEASE USE TO CHECK BLOOD SUGAR THREE TIMES DAILY   amitriptyline (ELAVIL) 10 MG tablet TAKE 1 TABLET BY MOUTH AT BEDTIME FOR MUSCLE SPASM. MAY MAKE YOU SLEEPY   amLODipine (NORVASC) 5 MG tablet TAKE 1 TABLET(5 MG) BY MOUTH DAILY   ammonium lactate (AMLACTIN) 12 % cream Apply topically as needed for dry skin.   Blood Glucose Monitoring Suppl (ONETOUCH VERIO) w/Device KIT Use to check blood sugars 3x per day. E11.9   esomeprazole (NEXIUM) 40 MG capsule Take 1 capsule (40 mg total) by mouth daily as needed (for acid reflux).    indapamide (LOZOL) 1.25 MG tablet TAKE 1 TABLET(1.25 MG) BY MOUTH DAILY   metFORMIN (GLUCOPHAGE-XR) 500 MG 24 hr tablet Take one by mouth daily   OneTouch Delica Lancets 93H MISC Please use to check blood sugar three times per day.   rosuvastatin (CRESTOR) 20 MG tablet Take 1 tablet (20 mg total) by mouth daily.   traMADol (ULTRAM) 50 MG tablet TAKE 2 TABLETS BY MOUTH TWICE DAILY AS NEEDED FOR PAIN   No current facility-administered medications for this visit. (Other)   REVIEW OF SYSTEMS: ROS   Positive for: Gastrointestinal, Musculoskeletal, Endocrine, Eyes Negative for: Constitutional, Neurological, Skin, Genitourinary, HENT, Cardiovascular, Respiratory, Psychiatric, Allergic/Imm, Heme/Lymph Last edited by Theodore Demark, COA on 02/21/2022 10:26 AM.     ALLERGIES Allergies  Allergen Reactions   Penicillins Rash and Other (See Comments)    PATIENT HAS HAD A PCN REACTION WITH IMMEDIATE RASH, FACIAL/TONGUE/THROAT SWELLING, SOB, OR LIGHTHEADEDNESS WITH HYPOTENSION:  #  #  #  YES  #  #  #   Has patient had a PCN reaction causing severe rash involving mucus membranes or skin necrosis: NO Has patient had a PCN reaction that required hospitalization NO Has patient had a PCN reaction occurring within the last 10 years: NO If all of the above answers are "NO", then may proceed with Cephalosporin use.   PAST MEDICAL HISTORY Past Medical History:  Diagnosis Date  Arthritis    "in my back" (05/28/2016)   Borderline diabetes    Cataract    RIGHT EYE   Chronic lower back pain    Claustrophobia    GERD (gastroesophageal reflux disease)    Headache    "monthly" (05/28/2016)   Heart murmur    High cholesterol    Hypertension    Hypertensive retinopathy    OU   Pre-diabetes    BORDERLINE....WATCHES WHAT SHE EATS   Past Surgical History:  Procedure Laterality Date   BREAST BIOPSY Left    BREAST CYST EXCISION Left    BREAST CYST EXCISION Right    BREAST EXCISIONAL BIOPSY Left     benign   CARPAL TUNNEL RELEASE Bilateral 02/2009 - 2016   right-left   COLON SURGERY  05/2007   Archie Endo 02/05/2011   COLONOSCOPY W/ BIOPSIES AND POLYPECTOMY     I & D EXTREMITY Right 05/28/2016   "hand"   I & D EXTREMITY Right 05/28/2016   Procedure: IRRIGATION AND DEBRIDEMENT EXTREMITY;  Surgeon: Charlotte Crumb, MD;  Location: [facility];  Service: Orthopedics;  Laterality: Right;   LUMBAR FUSION  11/20/2017   VAGINAL HYSTERECTOMY     "had fibroids"   FAMILY HISTORY Family History  Problem Relation Age of Onset   Hypertension Mother    Hypertension Father    Drug abuse Father    Hypertension Sister    Hypertension Brother    SOCIAL HISTORY Social History   Tobacco Use   Smoking status: Never   Smokeless tobacco: Never  Vaping Use   Vaping Use: Never used  Substance Use Topics   Alcohol use: No   Drug use: No       OPHTHALMIC EXAM:  Base Eye Exam     Visual Acuity (Snellen - Linear)       Right Left   Dist [place] 20/20 -1 20/25 -2   Dist ph [place]  20/20 -2         Tonometry (Tonopen, 10:23 AM)       Right Left   Pressure 18 22         Pupils       Dark Light Shape React APD   Right 4 3 Round Brisk None   Left 4 3 Round Brisk None         Visual Fields (Counting fingers)       Left Right    Full Full         Extraocular Movement       Right Left    Full, Ortho Full, Ortho         Neuro/Psych     Oriented x3: Yes   Mood/Affect: Normal         Dilation     Both eyes: 1.0% Mydriacyl, 2.5% Phenylephrine @ 10:23 AM           Slit Lamp and Fundus Exam     Slit Lamp Exam       Right Left   Lids/Lashes Dermatochalasis - upper lid, mild Meibomian gland dysfunction Dermatochalasis - upper lid, mild Meibomian gland dysfunction   Conjunctiva/Sclera mild Melanosis mild Melanosis   Cornea Arcus, trace PEE Arcus, trace PEE   Anterior Chamber Deep and quiet Deep and quiet   Iris Round and dilated Round and dilated   Lens 2-3+ Nuclear  sclerosis, 3+ Cortical cataract 2-3+ Nuclear sclerosis, 3+ Cortical cataract   Anterior Vitreous Vitreous syneresis Vitreous syneresis  Fundus Exam       Right Left   Disc Pink and Sharp, +PPP, Compact Pink and Sharp, milt tilt   C/D Ratio 0.4 0.3   Macula Flat, Good foveal reflex, Retinal pigment epithelial mottling, No heme or edema Flat, Good foveal reflex, Retinal pigment epithelial mottling, No heme or edema   Vessels attenuated, mild tortuosity mild attenuation, mild tortuosity   Periphery Attached, oval shaped pigmented lesion at 1030 nevus v CHRPE - stable from prior, shallow schisis ST periphery Attached, shallow schisis with inner retinal holes at 0400           Refraction     Wearing Rx       Sphere Cylinder Axis Add   Right -0.50 +0.50 150 +2.25   Left +0.25 Sphere  +2.25    Type: Bifocal           IMAGING AND PROCEDURES  Imaging and Procedures for _0 @  OCT, Retina - OU - Both Eyes       Right Eye Quality was good. Central Foveal Thickness: 279. Progression has been stable. Findings include normal foveal contour, no SRF, no IRF, vitreomacular adhesion (Peripheral schisis ST quad caught on widefield -- not imaged today).   Left Eye Quality was good. Central Foveal Thickness: 281. Progression has been stable. Findings include normal foveal contour, no SRF, no IRF, vitreomacular adhesion (+schisis IT periphery caught on widefield).   Notes *Images captured and stored on drive  Diagnosis / Impression:  NFP, no IRF/SRF OU VMA OU OD: Peripheral schisis ST quad caught on widefield -- not imaged today OS: +schisis IT periphery caught on widefield  Clinical management:  See below  Abbreviations: NFP - Normal foveal profile. CME - cystoid macular edema. PED - pigment epithelial detachment. IRF - intraretinal fluid. SRF - subretinal fluid. EZ - ellipsoid zone. ERM - epiretinal membrane. ORA - outer retinal atrophy. ORT - outer retinal tubulation.  SRHM - subretinal hyper-reflective material      Color Fundus Photography Optos - OU - Both Eyes       Right Eye Progression has been stable. Disc findings include normal observations. Macula : normal observations. Vessels : tortuous vessels, attenuated. Periphery : RPE abnormality (CHRPE at 1030).   Left Eye Progression has been stable. Disc findings include normal observations. Macula : normal observations. Vessels : tortuous vessels, attenuated. Periphery : (Retinoschisis w/ inner retinal holes at 0400).   Notes **Images stored on drive**  Impression: OD:  Oval CHRPE at 1030 OS:  Retinoschisis w/ inner retinal holes at 0400            ASSESSMENT/PLAN:    ICD-10-CM   1. Congenital hypertrophy of retinal pigment epithelium  Q14.1 Color Fundus Photography Optos - OU - Both Eyes    2. Bilateral retinoschisis  H33.103 OCT, Retina - OU - Both Eyes    3. Essential hypertension  I10     4. Hypertensive retinopathy of both eyes  H35.033 OCT, Retina - OU - Both Eyes    CANCELED: Fluorescein Angiography Optos (Transit OD)    5. Combined forms of age-related cataract of both eyes  H25.813      1. CHRPE OD  - flat, well-demarcated, pigmented lesion located at 1030 -- stable  - no SRF  - repeat optos images obtained today (05.04.22) -- stable from prior  - monitor for now  - f/u 1 year, DFE, OCT, optos colors/FAF  2. Retinoschisis OU  - OD: shallow schisis ST periphery  -  OS: shallow schisis located at 0400 with inner retinal holes  - no RD and no progression or change from prior   - monitor  - f/u in 1 year, sooner prn  3,4. Hypertensive retinopathy OU  - discussed importance of tight BP control  - monitor   5. Mixed form age related cataracts  - The symptoms of cataract, surgical options, and treatments and risks were discussed with patient.   - discussed diagnosis and progression  - under the expert management of Dr. Zenia Resides  - clear from a retina standpoint  to proceed with cataract surgery when pt and surgeon are ready    Ophthalmic Meds Ordered this visit:  No orders of the defined types were placed in this encounter.    Return in about 1 year (around 02/22/2023) for f/u CHRPE OD / schisis OU, DFE, OCT.  There are no Patient Instructions on file for this visit.   Explained the diagnoses, plan, and follow up with the patient and they expressed understanding.  Patient expressed understanding of the importance of proper follow up care.   This document serves as a record of services personally performed by Gardiner Sleeper, MD, PhD. It was created on their behalf by Leonie Douglas, an ophthalmic technician. The creation of this record is the provider's dictation and/or activities during the visit.    Electronically signed by: Leonie Douglas COA, 02/25/22  11:15 PM  This document serves as a record of services personally performed by Gardiner Sleeper, MD, PhD. It was created on their behalf by San Jetty. Owens Shark, OA an ophthalmic technician. The creation of this record is the provider's dictation and/or activities during the visit.    Electronically signed by: San Jetty. Owens Shark, [place] 05.19.2023 11:15 PM   Gardiner Sleeper, M.D., Ph.D. Diseases & Surgery of the Retina and Vitreous [facility]  I have reviewed the above documentation for accuracy and completeness, and I agree with the above. Gardiner Sleeper, M.D., Ph.D. 02/25/22 11:15 PM   Abbreviations: M myopia (nearsighted); A astigmatism; H hyperopia (farsighted); P presbyopia; Mrx spectacle prescription;  CTL contact lenses; OD right eye; OS left eye; OU both eyes  XT exotropia; ET esotropia; PEK punctate epithelial keratitis; PEE punctate epithelial erosions; DES dry eye syndrome; MGD meibomian gland dysfunction; ATs artificial tears; PFAT's preservative free artificial tears; [place] nuclear sclerotic cataract; PSC posterior subcapsular cataract; ERM epi-retinal membrane; PVD  posterior vitreous detachment; RD retinal detachment; DM diabetes mellitus; DR diabetic retinopathy; NPDR non-proliferative diabetic retinopathy; PDR proliferative diabetic retinopathy; CSME clinically significant macular edema; DME diabetic macular edema; dbh dot blot hemorrhages; CWS cotton wool spot; POAG primary open angle glaucoma; C/D cup-to-disc ratio; HVF humphrey visual field; GVF goldmann visual field; OCT optical coherence tomography; IOP intraocular pressure; BRVO Branch retinal vein occlusion; CRVO central retinal vein occlusion; CRAO central retinal artery occlusion; BRAO branch retinal artery occlusion; RT retinal tear; SB scleral buckle; PPV pars plana vitrectomy; VH Vitreous hemorrhage; PRP panretinal laser photocoagulation; IVK intravitreal kenalog; VMT vitreomacular traction; MH Macular hole;  NVD neovascularization of the disc; NVE neovascularization elsewhere; AREDS age related eye disease study; ARMD age related macular degeneration; POAG primary open angle glaucoma; EBMD epithelial/anterior basement membrane dystrophy; ACIOL anterior chamber intraocular lens; IOL intraocular lens; PCIOL posterior chamber intraocular lens; Phaco/IOL phacoemulsification with intraocular lens placement; [place] photorefractive keratectomy; LASIK laser assisted in situ keratomileusis; HTN hypertension; DM diabetes mellitus; COPD chronic obstructive pulmonary disease

## 2022-02-18 ENCOUNTER — Encounter (INDEPENDENT_AMBULATORY_CARE_PROVIDER_SITE_OTHER): Payer: Medicare Other | Admitting: Ophthalmology

## 2022-02-21 ENCOUNTER — Encounter (INDEPENDENT_AMBULATORY_CARE_PROVIDER_SITE_OTHER): Payer: Self-pay | Admitting: Ophthalmology

## 2022-02-21 ENCOUNTER — Ambulatory Visit (INDEPENDENT_AMBULATORY_CARE_PROVIDER_SITE_OTHER): Payer: Medicare Other | Admitting: Ophthalmology

## 2022-02-21 DIAGNOSIS — Q141 Congenital malformation of retina: Secondary | ICD-10-CM

## 2022-02-21 DIAGNOSIS — H33103 Unspecified retinoschisis, bilateral: Secondary | ICD-10-CM

## 2022-02-21 DIAGNOSIS — H35033 Hypertensive retinopathy, bilateral: Secondary | ICD-10-CM | POA: Diagnosis not present

## 2022-02-21 DIAGNOSIS — I10 Essential (primary) hypertension: Secondary | ICD-10-CM

## 2022-02-21 DIAGNOSIS — H33102 Unspecified retinoschisis, left eye: Secondary | ICD-10-CM

## 2022-02-21 DIAGNOSIS — H25813 Combined forms of age-related cataract, bilateral: Secondary | ICD-10-CM

## 2022-02-25 ENCOUNTER — Encounter (INDEPENDENT_AMBULATORY_CARE_PROVIDER_SITE_OTHER): Payer: Self-pay | Admitting: Ophthalmology

## 2022-02-26 ENCOUNTER — Ambulatory Visit: Payer: Medicare Other

## 2022-02-26 DIAGNOSIS — M2141 Flat foot [pes planus] (acquired), right foot: Secondary | ICD-10-CM

## 2022-02-26 DIAGNOSIS — E119 Type 2 diabetes mellitus without complications: Secondary | ICD-10-CM

## 2022-02-26 NOTE — Progress Notes (Signed)
SITUATION Reason for Visit: Fitting of Diabetic Shoe exchange Patient / Caregiver Report:  Patient is satisfied with fit and function of shoes and insoles.  OBJECTIVE DATA: Patient History / Diagnosis:     ICD-10-CM   1. Type 2 diabetes mellitus without complication, without long-term current use of insulin (HCC)  E11.9     2. Pes planus of both feet  M21.41    M21.42       Change in Status:   None  ACTIONS PERFORMED: In-Person Delivery, patient was fit with: - 1x pair A5500 PDAC approved prefabricated Diabetic Shoes: [place] 295 9.57M   Shoes and insoles were verified for structural integrity and safety. Patient wore shoes and insoles in office. Skin was inspected and free of areas of concern after wearing shoes and inserts. Shoes and inserts fit properly. Patient / Caregiver provided with ferbal instruction and demonstration regarding donning, doffing, wear, care, proper fit, function, purpose, cleaning, and use of shoes and insoles ' and in all related precautions and risks and benefits regarding shoes and insoles. Patient / Caregiver was instructed to wear properly fitting socks with shoes at all times. Patient was also provided with verbal instruction regarding how to report any failures or malfunctions of shoes or inserts, and necessary follow up care. Patient / Caregiver was also instructed to contact physician regarding change in status that may affect function of shoes and inserts.   Patient / Caregiver verbalized undersatnding of instruction provided. Patient / Caregiver demonstrated independence with proper donning and doffing of shoes and inserts.  PLAN Patient to follow with treating physician as recommended. Plan of care was discussed with and agreed upon by patient and/or caregiver. All questions were answered and concerns addressed.

## 2022-03-06 DIAGNOSIS — H25811 Combined forms of age-related cataract, right eye: Secondary | ICD-10-CM | POA: Diagnosis not present

## 2022-03-06 DIAGNOSIS — D3131 Benign neoplasm of right choroid: Secondary | ICD-10-CM | POA: Diagnosis not present

## 2022-03-11 ENCOUNTER — Encounter: Payer: Self-pay | Admitting: *Deleted

## 2022-03-21 ENCOUNTER — Encounter: Payer: Self-pay | Admitting: Podiatry

## 2022-03-21 ENCOUNTER — Ambulatory Visit (INDEPENDENT_AMBULATORY_CARE_PROVIDER_SITE_OTHER): Payer: Medicare Other | Admitting: Podiatry

## 2022-03-21 DIAGNOSIS — E119 Type 2 diabetes mellitus without complications: Secondary | ICD-10-CM | POA: Diagnosis not present

## 2022-03-21 DIAGNOSIS — L84 Corns and callosities: Secondary | ICD-10-CM

## 2022-03-26 ENCOUNTER — Other Ambulatory Visit: Payer: Self-pay | Admitting: *Deleted

## 2022-03-27 MED ORDER — TRAMADOL HCL 50 MG PO TABS: ORAL_TABLET | ORAL | 5 refills | Status: DC

## 2022-03-31 ENCOUNTER — Encounter: Payer: Self-pay | Admitting: Podiatry

## 2022-05-02 DIAGNOSIS — H25811 Combined forms of age-related cataract, right eye: Secondary | ICD-10-CM | POA: Diagnosis not present

## 2022-05-13 ENCOUNTER — Other Ambulatory Visit: Payer: Self-pay | Admitting: Family Medicine

## 2022-05-13 DIAGNOSIS — Z1231 Encounter for screening mammogram for malignant neoplasm of breast: Secondary | ICD-10-CM

## 2022-06-03 ENCOUNTER — Ambulatory Visit
Admission: RE | Admit: 2022-06-03 | Discharge: 2022-06-03 | Disposition: A | Discharge status: Home / Self Care | Payer: Medicare Other | Source: Ambulatory Visit | Attending: Family Medicine | Admitting: Family Medicine

## 2022-06-03 DIAGNOSIS — Z1231 Encounter for screening mammogram for malignant neoplasm of breast: Secondary | ICD-10-CM | POA: Diagnosis not present

## 2022-06-23 ENCOUNTER — Other Ambulatory Visit: Payer: Self-pay

## 2022-06-23 MED ORDER — ESOMEPRAZOLE MAGNESIUM 40 MG PO CPDR: 40.0000 mg | DELAYED_RELEASE_CAPSULE | Freq: Every day | ORAL | 3 refills | Status: DC | PRN

## 2022-06-27 ENCOUNTER — Ambulatory Visit: Payer: Medicare Other | Admitting: Podiatry

## 2022-07-13 ENCOUNTER — Other Ambulatory Visit: Payer: Self-pay | Admitting: Family Medicine

## 2022-08-18 ENCOUNTER — Other Ambulatory Visit: Payer: Self-pay | Admitting: Family Medicine

## 2022-08-26 ENCOUNTER — Other Ambulatory Visit: Payer: Self-pay

## 2022-08-27 MED ORDER — AMITRIPTYLINE HCL 10 MG PO TABS: ORAL_TABLET | ORAL | 3 refills | Status: DC

## 2022-08-27 MED ORDER — TRAMADOL HCL 50 MG PO TABS: ORAL_TABLET | ORAL | 5 refills | Status: DC

## 2022-08-27 MED ORDER — INDAPAMIDE 1.25 MG PO TABS: ORAL_TABLET | ORAL | 3 refills | Status: DC

## 2022-10-25 ENCOUNTER — Ambulatory Visit (INDEPENDENT_AMBULATORY_CARE_PROVIDER_SITE_OTHER): Payer: 59 | Admitting: Podiatry

## 2022-10-25 DIAGNOSIS — Z91199 Patient's noncompliance with other medical treatment and regimen due to unspecified reason: Secondary | ICD-10-CM

## 2022-10-27 ENCOUNTER — Ambulatory Visit (INDEPENDENT_AMBULATORY_CARE_PROVIDER_SITE_OTHER): Payer: 59 | Admitting: Podiatry

## 2022-10-27 ENCOUNTER — Encounter: Payer: Self-pay | Admitting: Podiatry

## 2022-10-27 DIAGNOSIS — L84 Corns and callosities: Secondary | ICD-10-CM

## 2022-10-27 DIAGNOSIS — E119 Type 2 diabetes mellitus without complications: Secondary | ICD-10-CM

## 2022-10-27 NOTE — Progress Notes (Signed)
1. No-show for appointment

## 2022-10-27 NOTE — Progress Notes (Signed)
  Subjective:  Patient ID: Annette Rios, female    DOB: December 14, 1955,  MRN: [id number]  GILBERTA PEETERS presents to clinic today for preventative diabetic foot care and callus(es) b/l lower extremities. Aggravating factors include weightbearing with and without shoe gear. Pain is relieved with periodic professional debridement.  Last known HgA1c was unknown. Patient did not check blood glucose today.  New problem(s): None.   PCP is Dickie La, MD , and last visit was September 03, 2021.  Allergies  Allergen Reactions   Penicillins Rash and Other (See Comments)    PATIENT HAS HAD A PCN REACTION WITH IMMEDIATE RASH, FACIAL/TONGUE/THROAT SWELLING, SOB, OR LIGHTHEADEDNESS WITH HYPOTENSION:  #  #  #  YES  #  #  #   Has patient had a PCN reaction causing severe rash involving mucus membranes or skin necrosis: NO Has patient had a PCN reaction that required hospitalization NO Has patient had a PCN reaction occurring within the last 10 years: NO If all of the above answers are "NO", then may proceed with Cephalosporin use.    Review of Systems: Negative except as noted in the HPI.  Objective: No changes noted in today's physical examination.  Constitutional Annette Rios is a pleasant 67 y.o. African American female, WD, WN in NAD. AAO x 3.   Vascular Capillary refill time to digits immediate b/l. Palpable pedal pulses b/l LE. Pedal hair present. Lower extremity skin temperature gradient within normal limits. No pain with calf compression b/l. No edema noted b/l lower extremities. No cyanosis or clubbing noted.  Neurologic Normal speech. Oriented to person, place, and time. Protective sensation intact 5/5 intact bilaterally with 10g monofilament b/l. Vibratory sensation intact b/l.  Dermatologic Pedal skin with normal turgor, texture and tone b/l lower extremities. No open wounds b/l lower extremities. Toenails 1-5 bilaterally well maintained with adequate length. No erythema, no edema, no  drainage, no fluctuance. Preulcerative lesion noted submet head 1 left foot, submet head 1 right foot, and sub 5th met base left foot. There is visible subdermal hemorrhage. There is no surrounding erythema, no edema, no drainage, no odor, no fluctuance.  Orthopedic: Normal muscle strength 5/5 to all lower extremity muscle groups bilaterally. Pes planus deformity noted b/l lower extremities.   Radiographs: None    Latest Ref Rng & Units 02/12/2022   10:28 AM  Hemoglobin A1C  Hemoglobin-A1c 0.0 - 7.0 % 6.4    Assessment/Plan: 1. Pre-ulcerative calluses   2. Callus   3. Type 2 diabetes mellitus without complication, without long-term current use of insulin (HCC)     -Discussed and educated patient on diabetic foot care, especially with  regards to the vascular, neurological and musculoskeletal systems.  -Stressed the importance of good glycemic control and the detriment of not  controlling glucose levels in relation to the foot. -Discussed supportive shoes at all times and checking feet regularly.  -Mechanically debrided  hyperkeratotic tissue without incident.  -Answered all patient questions -Patient to return  in 6 months for rfc.  -Patient advised to call the office if any problems or questions arise in the meantime.   No follow-ups on file.  Lorenda Peck, DPM

## 2022-10-29 ENCOUNTER — Encounter: Payer: Self-pay | Admitting: Family Medicine

## 2022-10-29 ENCOUNTER — Ambulatory Visit (INDEPENDENT_AMBULATORY_CARE_PROVIDER_SITE_OTHER): Payer: 59 | Admitting: Family Medicine

## 2022-10-29 VITALS — BP 130/80 | HR 56 | Ht 68.0 in | Wt 175.0 lb

## 2022-10-29 DIAGNOSIS — E7800 Pure hypercholesterolemia, unspecified: Secondary | ICD-10-CM

## 2022-10-29 DIAGNOSIS — I10 Essential (primary) hypertension: Secondary | ICD-10-CM | POA: Diagnosis not present

## 2022-10-29 DIAGNOSIS — E119 Type 2 diabetes mellitus without complications: Secondary | ICD-10-CM

## 2022-10-29 DIAGNOSIS — K219 Gastro-esophageal reflux disease without esophagitis: Secondary | ICD-10-CM

## 2022-10-29 LAB — POCT GLYCOSYLATED HEMOGLOBIN (HGB A1C): HbA1c, POC (controlled diabetic range): 6.5 % (ref 0.0–7.0)

## 2022-10-29 MED ORDER — FLUTICASONE PROPIONATE 50 MCG/ACT NA SUSP: 2.0000 | Freq: Every day | NASAL | 12 refills | Status: DC

## 2022-10-29 MED ORDER — TRIAMCINOLONE ACETONIDE 0.1 % EX CREA: TOPICAL_CREAM | CUTANEOUS | 3 refills | Status: DC

## 2022-10-29 NOTE — Patient Instructions (Signed)
I have sent in some cream for the rash. If it does not go away on 2-3 weeks, please come back and see me.  I will send you a note about your blood work. Great to see you!

## 2022-10-30 LAB — COMPREHENSIVE METABOLIC PANEL
ALT: 31 IU/L (ref 0–32)
AST: 23 IU/L (ref 0–40)
Albumin/Globulin Ratio: 1.6 (ref 1.2–2.2)
Albumin: 4.7 g/dL (ref 3.9–4.9)
Alkaline Phosphatase: 40 IU/L — ABNORMAL LOW (ref 44–121)
BUN/Creatinine Ratio: 28 (ref 12–28)
BUN: 21 mg/dL (ref 8–27)
Bilirubin Total: 0.4 mg/dL (ref 0.0–1.2)
CO2: 21 mmol/L (ref 20–29)
Calcium: 10.3 mg/dL (ref 8.7–10.3)
Chloride: 105 mmol/L (ref 96–106)
Creatinine, Ser: 0.76 mg/dL (ref 0.57–1.00)
Globulin, Total: 3 g/dL (ref 1.5–4.5)
Glucose: 96 mg/dL (ref 70–99)
Potassium: 4.5 mmol/L (ref 3.5–5.2)
Sodium: 143 mmol/L (ref 134–144)
Total Protein: 7.7 g/dL (ref 6.0–8.5)
eGFR: 86 mL/min/{1.73_m2} (ref >59)

## 2022-10-30 LAB — LIPID PANEL
Chol/HDL Ratio: 2.4 ratio (ref 0.0–4.4)
Cholesterol, Total: 132 mg/dL (ref 100–199)
HDL: 56 mg/dL (ref >39)
LDL Chol Calc (NIH): 64 mg/dL (ref 0–99)
Triglycerides: 56 mg/dL (ref 0–149)
VLDL Cholesterol Cal: 12 mg/dL (ref 5–40)

## 2022-10-30 LAB — MICROALBUMIN / CREATININE URINE RATIO
Creatinine, Urine: 163.3 mg/dL
Microalb/Creat Ratio: 7 mg/g creat (ref 0–29)
Microalbumin, Urine: 11.6 ug/mL

## 2022-10-31 ENCOUNTER — Encounter: Payer: Self-pay | Admitting: Family Medicine

## 2022-10-31 NOTE — Progress Notes (Signed)
    CHIEF COMPLAINT / HPI:  Recheck on DM  2. Rash on chest---started about 4 weeks ago. Improving but lingering. Slightly itchy. #3.  Follow-up hypertension: Feeling well.  No chest pain or shortness of breath with exertion.  No lower extremity swelling.  Taking medicines regularly.   PERTINENT  PMH / PSH: I have reviewed the patient's medications, allergies, past medical and surgical history, smoking status and updated in the EMR as appropriate.   OBJECTIVE:  BP 130/80   Pulse (!) 56   Ht '5\' 8"'$  (1.727 m)   Wt 175 lb (79.4 kg)   SpO2 98%   BMI 26.61 kg/m   Vital signs reviewed. GENERAL: Well-developed, well-nourished, no acute distress. CARDIOVASCULAR: Regular rate and rhythm no murmur gallop or rub LUNGS: Clear to auscultation bilaterally, no rales or wheeze. ABDOMEN: Soft positive bowel sounds NEURO: No gross focal neurological deficits. MSK: Movement of extremity x 4. SKIN: Between the breasts on her chest area and some underneath the right breast are some red papules.  They appear to be healing, possibly previous pustules or vesicles.  None are open.  No excoriation.  No surrounding induration. ASSESSMENT / PLAN:  Skin rash: This could be previous episode of shingles.  She states she had 1 single lesion on her right thigh but I did not appreciate that.  If it was shingles, she had little itching or pain with it.  She is mostly worried now that the rash is not going away.  Will give her some triamcinolone for the next couple of weeks.  If not resolving, she needs to come back. Type 2 diabetes mellitus without complications (HCC) E1D shows excellent control.  Will continue single tablet of metformin XR 500 daily.  She is trying to follow a pretty good diet.  Other labs today to check creatinine and urine albumin to creatinine.  HYPERTENSION, BENIGN SYSTEMIC Doing extremely well on amlodipine.  Continue current medication regimen check labs.  Follow-up 6  months.  HYPERCHOLESTEROLEMIA Check labs today.  No problems with her medication and will continue  GERD (gastroesophageal reflux disease) Doing well on current therapy so will not change..  Follow-up 6 months.   Dorcas Mcmurray MD

## 2022-10-31 NOTE — Assessment & Plan Note (Signed)
Check labs today.  No problems with her medication and will continue

## 2022-10-31 NOTE — Assessment & Plan Note (Signed)
A1c shows excellent control.  Will continue single tablet of metformin XR 500 daily.  She is trying to follow a pretty good diet.  Other labs today to check creatinine and urine albumin to creatinine.

## 2022-10-31 NOTE — Assessment & Plan Note (Signed)
Doing extremely well on amlodipine.  Continue current medication regimen check labs.  Follow-up 6 months.

## 2022-10-31 NOTE — Assessment & Plan Note (Addendum)
Doing well on current therapy so will not change..  Follow-up 6 months.

## 2022-11-12 ENCOUNTER — Encounter: Payer: Self-pay | Admitting: Family Medicine

## 2022-11-12 ENCOUNTER — Ambulatory Visit (INDEPENDENT_AMBULATORY_CARE_PROVIDER_SITE_OTHER): Payer: 59 | Admitting: Family Medicine

## 2022-11-12 VITALS — BP 128/72 | HR 101 | Wt 178.0 lb

## 2022-11-12 DIAGNOSIS — R21 Rash and other nonspecific skin eruption: Secondary | ICD-10-CM

## 2022-11-14 NOTE — Progress Notes (Signed)
    CHIEF COMPLAINT / HPI: #1.  Follow-up on the rash.  Had given her some cream to use which she used little bit of that.  Rash seems to be much better.  She still wants to know what this was.   PERTINENT  PMH / PSH: I have reviewed the patient's medications, allergies, past medical and surgical history, smoking status and updated in the EMR as appropriate.   OBJECTIVE:  BP 128/72   Pulse (!) 101   Wt 178 lb (80.7 kg)   SpO2 96%   BMI 27.06 kg/m  GENERAL: Well-developed no acute distress SKIN: Area in between the breasts on the anterior chest and upper stomach reveal resolved rash.  There are still some slight areas of hypopigmentation.  ASSESSMENT / PLAN: Rash resolved essentially.  I do think this is probably shingles.  We discussed that.  We discussed shingles vaccine.  She would be eligible for that although I would probably wait 2 months before I got it given her recent infection.  She says she does not really want to think about that right now but we can discuss it in the future.  No problem-specific Assessment & Plan notes found for this encounter.   Dorcas Mcmurray MD

## 2022-11-27 ENCOUNTER — Telehealth: Payer: Self-pay

## 2022-11-27 NOTE — Telephone Encounter (Signed)
Patient calls nurse line requesting handicap placard. Printed off [employer].   Placed in provider box for completion. Please return to RN team once completed.   Talbot Grumbling, RN

## 2022-12-02 NOTE — Telephone Encounter (Signed)
Attempted to call patient to inform DMV placard is ready.   Form placed up front.   Copy made for batch scanning.

## 2022-12-18 DIAGNOSIS — H25812 Combined forms of age-related cataract, left eye: Secondary | ICD-10-CM | POA: Diagnosis not present

## 2022-12-18 DIAGNOSIS — D3131 Benign neoplasm of right choroid: Secondary | ICD-10-CM | POA: Diagnosis not present

## 2022-12-18 DIAGNOSIS — Z961 Presence of intraocular lens: Secondary | ICD-10-CM | POA: Diagnosis not present

## 2022-12-29 DIAGNOSIS — L608 Other nail disorders: Secondary | ICD-10-CM | POA: Diagnosis not present

## 2023-01-09 ENCOUNTER — Other Ambulatory Visit: Payer: Self-pay | Admitting: Family Medicine

## 2023-02-17 ENCOUNTER — Telehealth: Payer: Self-pay | Admitting: Family Medicine

## 2023-02-17 DIAGNOSIS — L989 Disorder of the skin and subcutaneous tissue, unspecified: Secondary | ICD-10-CM

## 2023-02-17 NOTE — Telephone Encounter (Signed)
Patient called requesting a referral for a Skin doctor, due to the cream not working. She said the rash has came back but is now different looking. She would like the referral to be placed close by, she did mention she has been seen at the dermatology office in [place] [facility].   Please Advise.   Thanks!

## 2023-02-17 NOTE — Telephone Encounter (Signed)
Dear [name] Team Please let her know I have sent in referral and she shoud hear directly from the derm office in next 10-14 days. If she does not hear from them by then, please let me know THANKS! Annette Rios

## 2023-02-17 NOTE — Telephone Encounter (Signed)
Called pt. Verified using name and DOB. Gave pt the information below from Dr Jennette Kettle. Pt understands and will call us if needed. Sunday Spillers, CMA

## 2023-02-17 NOTE — Addendum Note (Signed)
Addended byDenny Levy L on: 02/17/2023 04:00 PM   Modules accepted: Orders

## 2023-02-24 ENCOUNTER — Encounter (INDEPENDENT_AMBULATORY_CARE_PROVIDER_SITE_OTHER): Payer: 59 | Admitting: Ophthalmology

## 2023-02-24 DIAGNOSIS — H33102 Unspecified retinoschisis, left eye: Secondary | ICD-10-CM

## 2023-02-24 DIAGNOSIS — I10 Essential (primary) hypertension: Secondary | ICD-10-CM

## 2023-02-24 DIAGNOSIS — Q141 Congenital malformation of retina: Secondary | ICD-10-CM

## 2023-02-24 DIAGNOSIS — H33103 Unspecified retinoschisis, bilateral: Secondary | ICD-10-CM

## 2023-02-24 DIAGNOSIS — H35033 Hypertensive retinopathy, bilateral: Secondary | ICD-10-CM

## 2023-02-24 DIAGNOSIS — H25813 Combined forms of age-related cataract, bilateral: Secondary | ICD-10-CM

## 2023-03-13 NOTE — Progress Notes (Deleted)
Subjective:   Annette Rios is a 67 y.o. female who presents for Medicare Annual (Subsequent) preventive examination.  I connected with  Miguel Rota on 03/13/23 by a {Video Enabled:26378::"video and audio"} enabled telemedicine application and verified that I am speaking with the correct person using two identifiers.  {Patient Location:[phone]::"Home"}  {Provider Location:[phone]::"Home Office"}  I discussed the limitations of evaluation and management by telemedicine. The patient expressed understanding and agreed to proceed.  Review of Systems    ***       Objective:    There were no vitals filed for this visit. There is no height or weight on file to calculate BMI.     11/12/2022   10:31 AM 10/29/2022   10:08 AM 02/12/2022   10:29 AM 02/20/2021    9:44 AM 01/30/2021   11:22 AM 10/10/2020   10:12 AM 08/22/2020    9:19 AM  Advanced Directives  Does Patient Have a Medical Advance Directive? No No No No No  No  Would patient like information on creating a medical advance directive? No - Patient declined No - Patient declined  No - Patient declined No - Patient declined No - Patient declined Yes (MAU/Ambulatory/Procedural Areas - Information given)    Current Medications (verified) Outpatient Encounter Medications as of 03/13/2023  Medication Sig   ACCU-CHEK GUIDE test strip PLEASE USE TO CHECK BLOOD SUGAR THREE TIMES DAILY   amitriptyline (ELAVIL) 10 MG tablet TAKE 1 TABLET BY MOUTH AT BEDTIME FOR MUSCLE SPASM. MAY. MAKE YOU SLEEPY   amLODipine (NORVASC) 5 MG tablet TAKE 1 TABLET(5 MG) BY MOUTH DAILY   Blood Glucose Monitoring Suppl (ONETOUCH VERIO) w/Device KIT Use to check blood sugars 3x per day. E11.9   esomeprazole (NEXIUM) 40 MG capsule Take 1 capsule (40 mg total) by mouth daily as needed (for acid reflux).   fluticasone (FLONASE) 50 MCG/ACT nasal spray Place 2 sprays into both nostrils daily.   metFORMIN (GLUCOPHAGE-XR) 500 MG 24 hr tablet Take one by mouth  daily   OneTouch Delica Lancets 33G MISC Please use to check blood sugar three times per day.   PREVIDENT 0.2 % SOLN Take by mouth.   rosuvastatin (CRESTOR) 20 MG tablet TAKE 1 TABLET(20 MG) BY MOUTH DAILY   traMADol (ULTRAM) 50 MG tablet Take 1 or 2 tabs by mouth once or twice a day as needed for pain   triamcinolone cream (KENALOG) 0.1 % Apply to affected area once or twice a day for 2 weeks   No facility-administered encounter medications on file as of 03/13/2023.    Allergies (verified) Penicillins   History: Past Medical History:  Diagnosis Date   Arthritis    "in my back" (05/28/2016)   Borderline diabetes    Cataract    RIGHT EYE   Chronic lower back pain    Claustrophobia    GERD (gastroesophageal reflux disease)    Headache    "monthly" (05/28/2016)   Heart murmur    High cholesterol    Hypertension    Hypertensive retinopathy    OU   Pre-diabetes    BORDERLINE....WATCHES WHAT SHE EATS   Past Surgical History:  Procedure Laterality Date   BREAST BIOPSY Left    BREAST CYST EXCISION Left    BREAST CYST EXCISION Right    BREAST EXCISIONAL BIOPSY Left    benign   CARPAL TUNNEL RELEASE Bilateral 02/2009 - 2016   right-left   COLON SURGERY  05/2007   Hattie Perch 02/05/2011   COLONOSCOPY  W/ BIOPSIES AND POLYPECTOMY     I & D EXTREMITY Right 05/28/2016   "hand"   I & D EXTREMITY Right 05/28/2016   Procedure: IRRIGATION AND DEBRIDEMENT EXTREMITY;  Surgeon: Dairl Ponder, MD;  Location: [facility] OR;  Service: Orthopedics;  Laterality: Right;   LUMBAR FUSION  11/20/2017   VAGINAL HYSTERECTOMY     "had fibroids"   Family History  Problem Relation Age of Onset   Hypertension Mother    Hypertension Father    Drug abuse Father    Hypertension Sister    Hypertension Brother    Social History   Socioeconomic History   Marital status: Divorced    Spouse name: Not on file   Number of children: 2   Years of education: 12   Highest education level: High school graduate   Occupational History   Not on file  Tobacco Use   Smoking status: Never   Smokeless tobacco: Never  Vaping Use   Vaping Use: Never used  Substance and Sexual Activity   Alcohol use: No   Drug use: No   Sexual activity: Not Currently  Other Topics Concern   Not on file  Social History Narrative   Patient lives with her niece in [place].    Patient has two sons who live in [place].   Patient goes to the [facility] and works out multiple days a week.    Patient likes to play horse shoes and bowl.   Patient enjoys watching scary movies, soap operas, and westerns.             Social Determinants of Health   Financial Resource Strain: Low Risk  (01/30/2021)   Overall Financial Resource Strain (CARDIA)    Difficulty of Paying Living Expenses: Not very hard  Food Insecurity: No Food Insecurity (01/30/2021)   Hunger Vital Sign    Worried About Running Out of Food in the Last Year: Never true    Ran Out of Food in the Last Year: Never true  Transportation Needs: No Transportation Needs (01/30/2021)   PRAPARE - Administrator, Civil Service (Medical): No    Lack of Transportation (Non-Medical): No  Physical Activity: Sufficiently Active (01/30/2021)   Exercise Vital Sign    Days of Exercise per Week: 3 days    Minutes of Exercise per Session: 140 min  Stress: No Stress Concern Present (01/30/2021)   [employer] of Occupational Health - Occupational Stress Questionnaire    Feeling of Stress : Only a little  Social Connections: Moderately Isolated (01/30/2021)   Social Connection and Isolation Panel [NHANES]    Frequency of Communication with Friends and Family: More than three times a week    Frequency of Social Gatherings with Friends and Family: More than three times a week    Attends Religious Services: 1 to 4 times per year    Active Member of [employer] or Organizations: No    Attends Banker Meetings: Never    Marital Status: Divorced    Tobacco  Counseling Counseling given: Not Answered   Clinical Intake:                 Diabetic? Yes   Nutrition Risk Assessment:  Has the patient had any N/V/D within the last 2 months?  {YES/NO:21197} Does the patient have any non-healing wounds?  {YES/NO:21197} Has the patient had any unintentional weight loss or weight gain?  {YES/NO:21197}  Diabetes:  Is the patient diabetic?  {YES/NO:21197} If diabetic, was  a CBG obtained today?  {YES/NO:21197} Did the patient bring in their glucometer from home?  {YES/NO:21197} How often do you monitor your CBG's? ***.   Financial Strains and Diabetes Management:  Are you having any financial strains with the device, your supplies or your medication? {YES/NO:21197}.  Does the patient want to be seen by Chronic Care Management for management of their diabetes?  {YES/NO:21197} Would the patient like to be referred to a Nutritionist or for Diabetic Management?  {YES/NO:21197}  Diabetic Exams:  Diabetic Eye Exam: Completed records requested Diabetic Foot Exam: Overdue, Pt has been advised about the importance in completing this exam. Pt is scheduled for diabetic foot exam on at next office visit .            Activities of Daily Living     No data to display          Patient Care Team: Nestor Ramp, MD as PCP - General (Family Medicine) Rennis Chris, MD as Consulting Physician (Ophthalmology)  Indicate any recent Medical Services you may have received from other than [facility] providers in the past year (date may be approximate).     Assessment:   This is a routine wellness examination for Elenah.  Hearing/Vision screen No results found.  Dietary issues and exercise activities discussed:     Goals Addressed   None    Depression Screen    11/12/2022   10:34 AM 10/29/2022   10:08 AM 02/12/2022   10:29 AM 08/14/2021    9:42 AM 02/20/2021    9:54 AM 01/30/2021   11:23 AM 01/30/2021   11:19 AM  PHQ 2/9 Scores  PHQ - 2  Score 0 0 0 1 4 0 0  PHQ- 9 Score 2 0  2 9      Fall Risk    11/12/2022   10:32 AM 10/29/2022   10:08 AM 01/30/2021   11:23 AM 10/10/2020   10:13 AM 09/05/2020   10:11 AM  Fall Risk   Falls in the past year? 0 0 0 0 0  Number falls in past yr:    0 0  Injury with Fall?    0   Follow up     Falls evaluation completed    FALL RISK PREVENTION PERTAINING TO THE HOME:  Any stairs in or around the home? {YES/NO:21197} If so, are there any without handrails? {YES/NO:21197} Home free of loose throw rugs in walkways, pet beds, electrical cords, etc? {YES/NO:21197} Adequate lighting in your home to reduce risk of falls? {YES/NO:21197}  ASSISTIVE DEVICES UTILIZED TO PREVENT FALLS:  Life alert? {YES/NO:21197} Use of a cane, walker or w/c? {YES/NO:21197} Grab bars in the bathroom? {YES/NO:21197} Shower chair or bench in shower? {YES/NO:21197} Elevated toilet seat or a handicapped toilet? {YES/NO:21197}  TIMED UP AND GO:  Was the test performed? No . Telephonic visit   Cognitive Function:        01/30/2021   11:24 AM  6CIT Screen  What Year? 0 points  What month? 0 points  What time? 0 points  Count back from 20 0 points  Months in reverse 0 points  Repeat phrase 0 points  Total Score 0 points    Immunizations Immunization History  Administered Date(s) Administered   PFIZER Comirnaty(Gray Top)Covid-19 Tri-Sucrose Vaccine 02/12/2021, 03/05/2021   Td 06/06/1997   Tdap 10/26/2015    TDAP status: Up to date  {Pneumococcal vaccine status:2101807}  Covid-19 vaccine status: Information provided on how to obtain vaccines.   Qualifies  for Shingles Vaccine? Yes   Zostavax completed No   Shingrix Completed?: No.    Education has been provided regarding the importance of this vaccine. Patient has been advised to call insurance company to determine out of pocket expense if they have not yet received this vaccine. Advised may also receive vaccine at local pharmacy or Health Dept.  Verbalized acceptance and understanding.  Screening Tests Health Maintenance  Topic Date Due   OPHTHALMOLOGY EXAM  Never done   Zoster Vaccines- Shingrix (1 of 2) Never done   Pneumonia Vaccine 60+ Years old (1 of 1 - PCV) Never done   DEXA SCAN  Never done   Medicare Annual Wellness (AWV)  01/30/2022   COVID-19 Vaccine (3 - 2023-24 season) 06/06/2022   FOOT EXAM  09/03/2022   HEMOGLOBIN A1C  04/29/2023   INFLUENZA VACCINE  05/07/2023   Diabetic kidney evaluation - eGFR measurement  10/30/2023   Diabetic kidney evaluation - Urine ACR  10/30/2023   MAMMOGRAM  06/03/2024   DTaP/Tdap/Td (3 - Td or Tdap) 10/25/2025   Colonoscopy  06/18/2027   Hepatitis C Screening  Completed   HPV VACCINES  Aged Out    Health Maintenance  Health Maintenance Due  Topic Date Due   OPHTHALMOLOGY EXAM  Never done   Zoster Vaccines- Shingrix (1 of 2) Never done   Pneumonia Vaccine 75+ Years old (1 of 1 - PCV) Never done   DEXA SCAN  Never done   Medicare Annual Wellness (AWV)  01/30/2022   COVID-19 Vaccine (3 - 2023-24 season) 06/06/2022   FOOT EXAM  09/03/2022    Colorectal cancer screening: Type of screening: Colonoscopy. Completed 06/11/17. Repeat every 10 years  Mammogram status: Completed 06/03/22. Repeat every year  {Bone Density status:21018021}  Lung Cancer Screening: (Low Dose CT Chest recommended if Age 90-80 years, 30 pack-year currently smoking OR have quit w/in 15years.) does not qualify.   Lung Cancer Screening Referral: n/a  Additional Screening:  Hepatitis C Screening: does qualify; Completed 11/19/16  Vision Screening: Recommended annual ophthalmology exams for early detection of glaucoma and other disorders of the eye. Is the patient up to date with their annual eye exam?  Yes  Who is the provider or what is the name of the office in which the patient attends annual eye exams? Dr. Dione Booze If pt is not established with a provider, would they like to be referred to a provider to  establish care? No .   Dental Screening: Recommended annual dental exams for proper oral hygiene  Community Resource Referral / Chronic Care Management: CRR required this visit?  {YES/NO:21197}  CCM required this visit?  {YES/NO:21197}     Plan:     I have personally reviewed and noted the following in the patient's chart:   Medical and social history Use of alcohol, tobacco or illicit drugs  Current medications and supplements including opioid prescriptions. {Opioid Prescriptions:7082894587} Functional ability and status Nutritional status Physical activity Advanced directives List of other physicians Hospitalizations, surgeries, and ER visits in previous 12 months Vitals Screenings to include cognitive, depression, and falls Referrals and appointments  In addition, I have reviewed and discussed with patient certain preventive protocols, quality metrics, and best practice recommendations. A written personalized care plan for preventive services as well as general preventive health recommendations were provided to patient.     Durwin Nora, [place]   [birth year]   Due to this being a virtual visit, the after visit summary with patients personalized plan was  offered to patient via mail or my-chart. ***Patient declined at this time./ Patient would like to access on my-chart/ per request, patient was mailed a copy of AVS./ Patient preferred to pick up at office at next visit  Nurse Notes: ***

## 2023-04-07 ENCOUNTER — Encounter (INDEPENDENT_AMBULATORY_CARE_PROVIDER_SITE_OTHER): Payer: Self-pay | Admitting: Ophthalmology

## 2023-04-07 ENCOUNTER — Ambulatory Visit (INDEPENDENT_AMBULATORY_CARE_PROVIDER_SITE_OTHER): Payer: 59 | Admitting: Ophthalmology

## 2023-04-07 DIAGNOSIS — Z961 Presence of intraocular lens: Secondary | ICD-10-CM | POA: Diagnosis not present

## 2023-04-07 DIAGNOSIS — H35033 Hypertensive retinopathy, bilateral: Secondary | ICD-10-CM

## 2023-04-07 DIAGNOSIS — I10 Essential (primary) hypertension: Secondary | ICD-10-CM

## 2023-04-07 DIAGNOSIS — H33103 Unspecified retinoschisis, bilateral: Secondary | ICD-10-CM

## 2023-04-07 DIAGNOSIS — H25812 Combined forms of age-related cataract, left eye: Secondary | ICD-10-CM | POA: Diagnosis not present

## 2023-04-07 DIAGNOSIS — H43811 Vitreous degeneration, right eye: Secondary | ICD-10-CM

## 2023-04-07 DIAGNOSIS — Q141 Congenital malformation of retina: Secondary | ICD-10-CM

## 2023-04-07 DIAGNOSIS — H33102 Unspecified retinoschisis, left eye: Secondary | ICD-10-CM

## 2023-04-07 NOTE — Progress Notes (Signed)
[facility] - Clinic Note  04/07/2023     CHIEF COMPLAINT Patient presents for Retina Follow Up   HISTORY OF PRESENT ILLNESS: Annette Rios is a 67 y.o. female who presents to the clinic today for:   HPI     Retina Follow Up   In right eye.  This started 1 year ago.  Duration of 1 year.  Since onset it is stable.  I, the attending physician,  performed the HPI with the patient and updated documentation appropriately.        Comments   1 year Retina follow CHRPE OD pt is reporting no vision changes noticed pt denies any flashes or floaters       Last edited by Rennis Chris, MD on 04/08/2023  5:32 PM.     pt states her vision looks blurry and she is seeing floaters, no new health concerns   Referring physician: Nestor Ramp, MD [address] [place],  [place] [address]  HISTORICAL INFORMATION:   Selected notes from the MEDICAL RECORD NUMBER Referred by Dr. Fabian Sharp for concern of a nevus OD LEE: (S. Groat) [BCVA: OD: OS:] Ocular Hx-cataract OD PMH-arthritis, borderline diabetic, headaches, heart murmur, HLD, HTN    CURRENT MEDICATIONS: No current outpatient medications on file. (Ophthalmic Drugs)   No current facility-administered medications for this visit. (Ophthalmic Drugs)   Current Outpatient Medications (Other)  Medication Sig   ACCU-CHEK GUIDE test strip PLEASE USE TO CHECK BLOOD SUGAR THREE TIMES DAILY   amitriptyline (ELAVIL) 10 MG tablet TAKE 1 TABLET BY MOUTH AT BEDTIME FOR MUSCLE SPASM. MAY. MAKE YOU SLEEPY   amLODipine (NORVASC) 5 MG tablet TAKE 1 TABLET(5 MG) BY MOUTH DAILY   Blood Glucose Monitoring Suppl (ONETOUCH VERIO) w/Device KIT Use to check blood sugars 3x per day. E11.9   esomeprazole (NEXIUM) 40 MG capsule Take 1 capsule (40 mg total) by mouth daily as needed (for acid reflux).   fluticasone (FLONASE) 50 MCG/ACT nasal spray Place 2 sprays into both nostrils daily.   metFORMIN (GLUCOPHAGE-XR) 500 MG 24 hr  tablet Take one by mouth daily   OneTouch Delica Lancets 33G MISC Please use to check blood sugar three times per day.   PREVIDENT 0.2 % SOLN Take by mouth.   rosuvastatin (CRESTOR) 20 MG tablet TAKE 1 TABLET(20 MG) BY MOUTH DAILY   traMADol (ULTRAM) 50 MG tablet Take 1 or 2 tabs by mouth once or twice a day as needed for pain   triamcinolone cream (KENALOG) 0.1 % Apply to affected area once or twice a day for 2 weeks   No current facility-administered medications for this visit. (Other)   REVIEW OF SYSTEMS: ROS   Positive for: Gastrointestinal, Musculoskeletal, Endocrine, Eyes Negative for: Constitutional, Neurological, Skin, Genitourinary, HENT, Cardiovascular, Respiratory, Psychiatric, Allergic/Imm, Heme/Lymph Last edited by Etheleen Mayhew, COT on 04/07/2023  1:11 PM.     ALLERGIES Allergies  Allergen Reactions   Penicillins Rash and Other (See Comments)    PATIENT HAS HAD A PCN REACTION WITH IMMEDIATE RASH, FACIAL/TONGUE/THROAT SWELLING, SOB, OR LIGHTHEADEDNESS WITH HYPOTENSION:  #  #  #  YES  #  #  #   Has patient had a PCN reaction causing severe rash involving mucus membranes or skin necrosis: NO Has patient had a PCN reaction that required hospitalization NO Has patient had a PCN reaction occurring within the last 10 years: NO If all of the above answers are "NO", then may proceed with Cephalosporin  use.   PAST MEDICAL HISTORY Past Medical History:  Diagnosis Date   Arthritis    "in my back" (05/28/2016)   Borderline diabetes    Cataract    RIGHT EYE   Chronic lower back pain    Claustrophobia    GERD (gastroesophageal reflux disease)    Headache    "monthly" (05/28/2016)   Heart murmur    High cholesterol    Hypertension    Hypertensive retinopathy    OU   Pre-diabetes    BORDERLINE....WATCHES WHAT SHE EATS   Past Surgical History:  Procedure Laterality Date   BREAST BIOPSY Left    BREAST CYST EXCISION Left    BREAST CYST EXCISION Right    BREAST  EXCISIONAL BIOPSY Left    benign   CARPAL TUNNEL RELEASE Bilateral 02/2009 - 2016   right-left   COLON SURGERY  05/2007   Hattie Perch 02/05/2011   COLONOSCOPY W/ BIOPSIES AND POLYPECTOMY     I & D EXTREMITY Right 05/28/2016   "hand"   I & D EXTREMITY Right 05/28/2016   Procedure: IRRIGATION AND DEBRIDEMENT EXTREMITY;  Surgeon: Dairl Ponder, MD;  Location: [facility] OR;  Service: Orthopedics;  Laterality: Right;   LUMBAR FUSION  11/20/2017   VAGINAL HYSTERECTOMY     "had fibroids"   FAMILY HISTORY Family History  Problem Relation Age of Onset   Hypertension Mother    Hypertension Father    Drug abuse Father    Hypertension Sister    Hypertension Brother    SOCIAL HISTORY Social History   Tobacco Use   Smoking status: Never   Smokeless tobacco: Never  Vaping Use   Vaping Use: Never used  Substance Use Topics   Alcohol use: No   Drug use: No       OPHTHALMIC EXAM:  Base Eye Exam     Visual Acuity (Snellen - Linear)       Right Left   Dist [place] 20/25 -2 20/20 -2   Dist ph cc NI          Tonometry (Tonopen, 1:18 PM)       Right Left   Pressure 18 18         Pupils       Pupils Dark Light Shape React APD   Right PERRL 4 3 Round Brisk None   Left PERRL 4 3 Round Brisk None         Extraocular Movement       Right Left    Full, Ortho Full, Ortho         Neuro/Psych     Oriented x3: Yes   Mood/Affect: Normal         Dilation     Both eyes: 2.5% Phenylephrine @ 1:18 PM           Slit Lamp and Fundus Exam     Slit Lamp Exam       Right Left   Lids/Lashes Dermatochalasis - upper lid Dermatochalasis - upper lid, mild Meibomian gland dysfunction   Conjunctiva/Sclera mild Melanosis mild Melanosis   Cornea Arcus, well healed cataract wound Arcus   Anterior Chamber deep and clear deep and clear   Iris Round and dilated Round and dilated   Lens PC IOL in good position 2-3+ Nuclear sclerosis, 2-3+ Cortical cataract   Anterior Vitreous mild  syneresis, Posterior vitreous detachment mild syneresis         Fundus Exam       Right Left  Disc Pink and Sharp, mild PPP, Compact, mild tilt Pink and Sharp, milt tilt   C/D Ratio 0.4 0.3   Macula Flat, Good foveal reflex, mild ERM, Retinal pigment epithelial mottling, No heme or edema Flat, Good foveal reflex, Retinal pigment epithelial mottling, No heme or edema   Vessels mild attenuation, mild tortuosity attenuated, mild tortuosity   Periphery Attached, oval shaped pigmented lesion at 1030 (CHRPE) - stable from prior, shallow schisis ST periphery Attached, shallow schisis with inner retinal holes at 0400           Refraction     Wearing Rx       Sphere Cylinder Axis Add   Right -0.50 +0.50 150 +2.25   Left +0.25 Sphere  +2.25    Type: Bifocal           IMAGING AND PROCEDURES  Imaging and Procedures for @TODAY @  OCT, Retina - OU - Both Eyes       Right Eye Quality was good. Central Foveal Thickness: 275. Progression has been stable. Findings include normal foveal contour, no IRF, no SRF (Interval release of VMA to full PVD, Peripheral schisis ST quad caught on widefield -- not imaged today).   Left Eye Quality was good. Central Foveal Thickness: 278. Progression has been stable. Findings include normal foveal contour, no IRF, no SRF, vitreomacular adhesion (+schisis IT periphery caught on widefield -- not imaged today).   Notes *Images captured and stored on drive  Diagnosis / Impression:  NFP, no IRF/SRF OU OD: Interval release of VMA to full PVD, Peripheral schisis ST quad caught on widefield -- not imaged today OS: +schisis IT periphery caught on widefield -- not imaged today; +VMA  Clinical management:  See below  Abbreviations: NFP - Normal foveal profile. CME - cystoid macular edema. PED - pigment epithelial detachment. IRF - intraretinal fluid. SRF - subretinal fluid. EZ - ellipsoid zone. ERM - epiretinal membrane. ORA - outer retinal atrophy. ORT  - outer retinal tubulation. SRHM - subretinal hyper-reflective material            ASSESSMENT/PLAN:    ICD-10-CM   1. Congenital hypertrophy of retinal pigment epithelium  Q14.1 OCT, Retina - OU - Both Eyes    2. Bilateral retinoschisis  H33.103     3. Posterior vitreous detachment of right eye  H43.811 OCT, Retina - OU - Both Eyes    4. Essential hypertension  I10     5. Hypertensive retinopathy of both eyes  H35.033     6. Combined forms of age-related cataract of left eye  H25.812     7. Pseudophakia  Z96.1      1. CHRPE OD  - flat, well-demarcated, pigmented lesion located at 1030 -- stable  - no SRF  - optos images obtained (05.04.22) -- stable from prior  - monitor for now  - f/u 1 year, DFE, OCT, optos colors/FAF  2. Retinoschisis OU  - OD: shallow schisis ST periphery  - OS: shallow schisis located at 0400 with inner retinal holes  - no RD and no progression or change from prior   - monitor  - f/u in 1 year, sooner prn  3. PVD / vitreous syneresis OD  - Discussed findings and prognosis  - No RT or RD on 360 scleral depressed exam  - Reviewed s/s of RT/RD  - Strict return precautions for any such RT/RD signs/symptoms  - monitor  4,5. Hypertensive retinopathy OU  - discussed importance of tight BP control  -  monitor   6. Mixed form age related cataracts OS  - The symptoms of cataract, surgical options, and treatments and risks were discussed with patient.   - discussed diagnosis and progression  - under the expert management of Dr. Laruth Bouchard  - clear from a retina standpoint to proceed with cataract surgery when pt and surgeon are ready   7. Pseudophakia OD  - s/p CE/IOL (Dr. Dione Booze)  - IOL in good position, doing well  - monitor  Ophthalmic Meds Ordered this visit:  No orders of the defined types were placed in this encounter.    Return in about 1 year (around 04/06/2024) for f/u CHRPE OD / retinoschisis OU, DFE, OCT.  There are no Patient  Instructions on file for this visit.   Explained the diagnoses, plan, and follow up with the patient and they expressed understanding.  Patient expressed understanding of the importance of proper follow up care.   This document serves as a record of services personally performed by Karie Chimera, MD, PhD. It was created on their behalf by Gerilyn Nestle, COT an ophthalmic technician. The creation of this record is the provider's dictation and/or activities during the visit.    Electronically signed by:  Charlette Caffey, COT  04/08/23 5:41 PM  This document serves as a record of services personally performed by Karie Chimera, MD, PhD. It was created on their behalf by Glee Arvin. Manson Passey, OA an ophthalmic technician. The creation of this record is the provider's dictation and/or activities during the visit.    Electronically signed by: Glee Arvin. Manson Passey, OA 04/08/23 5:41 PM   Karie Chimera, M.D., Ph.D. Diseases & Surgery of the Retina and Vitreous [facility]  I have reviewed the above documentation for accuracy and completeness, and I agree with the above. Karie Chimera, M.D., Ph.D. 04/08/23 5:41 PM  Abbreviations: M myopia (nearsighted); A astigmatism; H hyperopia (farsighted); P presbyopia; Mrx spectacle prescription;  CTL contact lenses; OD right eye; OS left eye; OU both eyes  XT exotropia; ET esotropia; PEK punctate epithelial keratitis; PEE punctate epithelial erosions; DES dry eye syndrome; MGD meibomian gland dysfunction; ATs artificial tears; PFAT's preservative free artificial tears; NSC nuclear sclerotic cataract; PSC posterior subcapsular cataract; ERM epi-retinal membrane; PVD posterior vitreous detachment; RD retinal detachment; DM diabetes mellitus; DR diabetic retinopathy; NPDR non-proliferative diabetic retinopathy; PDR proliferative diabetic retinopathy; CSME clinically significant macular edema; DME diabetic macular edema; dbh dot blot  hemorrhages; CWS cotton wool spot; POAG primary open angle glaucoma; C/D cup-to-disc ratio; HVF humphrey visual field; GVF goldmann visual field; OCT optical coherence tomography; IOP intraocular pressure; BRVO Branch retinal vein occlusion; CRVO central retinal vein occlusion; CRAO central retinal artery occlusion; BRAO branch retinal artery occlusion; RT retinal tear; SB scleral buckle; PPV pars plana vitrectomy; VH Vitreous hemorrhage; PRP panretinal laser photocoagulation; IVK intravitreal kenalog; VMT vitreomacular traction; MH Macular hole;  NVD neovascularization of the disc; NVE neovascularization elsewhere; AREDS age related eye disease study; ARMD age related macular degeneration; POAG primary open angle glaucoma; EBMD epithelial/anterior basement membrane dystrophy; ACIOL anterior chamber intraocular lens; IOL intraocular lens; PCIOL posterior chamber intraocular lens; Phaco/IOL phacoemulsification with intraocular lens placement; PRK photorefractive keratectomy; LASIK laser assisted in situ keratomileusis; HTN hypertension; DM diabetes mellitus; COPD chronic obstructive pulmonary disease

## 2023-04-08 ENCOUNTER — Encounter (INDEPENDENT_AMBULATORY_CARE_PROVIDER_SITE_OTHER): Payer: Self-pay | Admitting: Ophthalmology

## 2023-04-09 NOTE — Progress Notes (Signed)
Subjective:   Annette Rios is a 67 y.o. female who presents for Medicare Annual (Subsequent) preventive examination.  Visit Complete: Virtual  I connected with  Miguel Rota on 04/10/23 by a audio enabled telemedicine application and verified that I am speaking with the correct person using two identifiers.  Patient Location: Home  Provider Location: Home Office  I discussed the limitations of evaluation and management by telemedicine. The patient expressed understanding and agreed to proceed.  Review of Systems     Cardiac Risk Factors include: advanced age (>62men, >26 women);diabetes mellitus;dyslipidemia;hypertension     Objective:    Today's Vitals   04/10/23 1549  Weight: 178 lb (80.7 kg)  Height: 5\' 8"  (1.727 m)   Body mass index is 27.06 kg/m.     04/10/2023    3:52 PM 11/12/2022   10:31 AM 10/29/2022   10:08 AM 02/12/2022   10:29 AM 02/20/2021    9:44 AM 01/30/2021   11:22 AM 10/10/2020   10:12 AM  Advanced Directives  Does Patient Have a Medical Advance Directive? No No No No No No   Would patient like information on creating a medical advance directive? Yes (MAU/Ambulatory/Procedural Areas - Information given) No - Patient declined No - Patient declined  No - Patient declined No - Patient declined No - Patient declined    Current Medications (verified) Outpatient Encounter Medications as of 04/10/2023  Medication Sig   ACCU-CHEK GUIDE test strip PLEASE USE TO CHECK BLOOD SUGAR THREE TIMES DAILY   amitriptyline (ELAVIL) 10 MG tablet TAKE 1 TABLET BY MOUTH AT BEDTIME FOR MUSCLE SPASM. MAY. MAKE YOU SLEEPY   amLODipine (NORVASC) 5 MG tablet TAKE 1 TABLET(5 MG) BY MOUTH DAILY   Blood Glucose Monitoring Suppl (ONETOUCH VERIO) w/Device KIT Use to check blood sugars 3x per day. E11.9   esomeprazole (NEXIUM) 40 MG capsule Take 1 capsule (40 mg total) by mouth daily as needed (for acid reflux).   fluticasone (FLONASE) 50 MCG/ACT nasal spray Place 2 sprays into both  nostrils daily.   metFORMIN (GLUCOPHAGE-XR) 500 MG 24 hr tablet Take one by mouth daily   OneTouch Delica Lancets 33G MISC Please use to check blood sugar three times per day.   PREVIDENT 0.2 % SOLN Take by mouth.   rosuvastatin (CRESTOR) 20 MG tablet TAKE 1 TABLET(20 MG) BY MOUTH DAILY   traMADol (ULTRAM) 50 MG tablet Take 1 or 2 tabs by mouth once or twice a day as needed for pain   triamcinolone cream (KENALOG) 0.1 % Apply to affected area once or twice a day for 2 weeks   No facility-administered encounter medications on file as of 04/10/2023.    Allergies (verified) Penicillins   History: Past Medical History:  Diagnosis Date   Arthritis    "in my back" (05/28/2016)   Borderline diabetes    Cataract    RIGHT EYE   Chronic lower back pain    Claustrophobia    GERD (gastroesophageal reflux disease)    Headache    "monthly" (05/28/2016)   Heart murmur    High cholesterol    Hypertension    Hypertensive retinopathy    OU   Pre-diabetes    BORDERLINE....WATCHES WHAT SHE EATS   Past Surgical History:  Procedure Laterality Date   BREAST BIOPSY Left    BREAST CYST EXCISION Left    BREAST CYST EXCISION Right    BREAST EXCISIONAL BIOPSY Left    benign   CARPAL TUNNEL RELEASE Bilateral 02/2009 -  2016   right-left   COLON SURGERY  05/2007   Hattie Perch 02/05/2011   COLONOSCOPY W/ BIOPSIES AND POLYPECTOMY     I & D EXTREMITY Right 05/28/2016   "hand"   I & D EXTREMITY Right 05/28/2016   Procedure: IRRIGATION AND DEBRIDEMENT EXTREMITY;  Surgeon: Dairl Ponder, MD;  Location: [facility] OR;  Service: Orthopedics;  Laterality: Right;   LUMBAR FUSION  11/20/2017   VAGINAL HYSTERECTOMY     "had fibroids"   Family History  Problem Relation Age of Onset   Hypertension Mother    Hypertension Father    Drug abuse Father    Hypertension Sister    Hypertension Brother    Social History   Socioeconomic History   Marital status: Divorced    Spouse name: Not on file   Number of children:  2   Years of education: 12   Highest education level: High school graduate  Occupational History   Not on file  Tobacco Use   Smoking status: Never   Smokeless tobacco: Never  Vaping Use   Vaping Use: Never used  Substance and Sexual Activity   Alcohol use: No   Drug use: No   Sexual activity: Not Currently  Other Topics Concern   Not on file  Social History Narrative   Patient lives with her niece in [place].    Patient has two sons who live in [place].   Patient goes to the [employer] and works out multiple days a week.    Patient likes to play horse shoes and bowl.   Patient enjoys watching scary movies, soap operas, and westerns.             Social Determinants of Health   Financial Resource Strain: Low Risk  (04/10/2023)   Overall Financial Resource Strain (CARDIA)    Difficulty of Paying Living Expenses: Not hard at all  Food Insecurity: No Food Insecurity (04/10/2023)   Hunger Vital Sign    Worried About Running Out of Food in the Last Year: Never true    Ran Out of Food in the Last Year: Never true  Transportation Needs: No Transportation Needs (04/10/2023)   PRAPARE - Administrator, Civil Service (Medical): No    Lack of Transportation (Non-Medical): No  Physical Activity: Insufficiently Active (04/10/2023)   Exercise Vital Sign    Days of Exercise per Week: 3 days    Minutes of Exercise per Session: 30 min  Stress: No Stress Concern Present (04/10/2023)   [employer] of Occupational Health - Occupational Stress Questionnaire    Feeling of Stress : Not at all  Social Connections: Moderately Isolated (04/10/2023)   Social Connection and Isolation Panel [NHANES]    Frequency of Communication with Friends and Family: More than three times a week    Frequency of Social Gatherings with Friends and Family: Three times a week    Attends Religious Services: 1 to 4 times per year    Active Member of Clubs or Organizations: No    Attends Banker  Meetings: Never    Marital Status: Divorced    Tobacco Counseling Counseling given: Not Answered   Clinical Intake:  Pre-visit preparation completed: Yes  Pain : No/denies pain     Diabetes: Yes CBG done?: No Did pt. bring in CBG monitor from home?: No  How often do you need to have someone help you when you read instructions, pamphlets, or other written materials from your doctor or pharmacy?: 1 -  Never  Interpreter Needed?: No  Information entered by :: Kandis Fantasia LPN   Activities of Daily Living    04/10/2023    3:51 PM  In your present state of health, do you have any difficulty performing the following activities:  Hearing? 0  Vision? 0  Difficulty concentrating or making decisions? 0  Walking or climbing stairs? 0  Dressing or bathing? 0  Doing errands, shopping? 0  Preparing Food and eating ? N  Using the Toilet? N  In the past six months, have you accidently leaked urine? N  Do you have problems with loss of bowel control? N  Managing your Medications? N  Managing your Finances? N  Housekeeping or managing your Housekeeping? N    Patient Care Team: Nestor Ramp, MD as PCP - General (Family Medicine) Rennis Chris, MD as Consulting Physician (Ophthalmology) [facility] Louann Sjogren, DPM as Consulting Physician (Podiatry)  Indicate any recent Medical Services you may have received from other than [facility] providers in the past year (date may be approximate).     Assessment:   This is a routine wellness examination for Khole.  Hearing/Vision screen Hearing Screening - Comments:: Denies hearing difficulties   Vision Screening - Comments:: Wears rx glasses - up to date with routine eye exams with [facility]    Dietary issues and exercise activities discussed:     Goals Addressed             This Visit's Progress    Remain active and independent         Depression Screen    04/10/2023    3:51 PM 11/12/2022    10:34 AM 10/29/2022   10:08 AM 02/12/2022   10:29 AM 08/14/2021    9:42 AM 02/20/2021    9:54 AM 01/30/2021   11:23 AM  PHQ 2/9 Scores  PHQ - 2 Score 0 0 0 0 1 4 0  PHQ- 9 Score  2 0  2 9     Fall Risk    04/10/2023    3:51 PM 11/12/2022   10:32 AM 10/29/2022   10:08 AM 01/30/2021   11:23 AM 10/10/2020   10:13 AM  Fall Risk   Falls in the past year? 0 0 0 0 0  Number falls in past yr: 0    0  Injury with Fall? 0    0  Risk for fall due to : No Fall Risks      Follow up Falls prevention discussed;Education provided;Falls evaluation completed        MEDICARE RISK AT HOME:  Medicare Risk at Home - 04/10/23 1551     Any stairs in or around the home? No    If so, are there any without handrails? No    Home free of loose throw rugs in walkways, pet beds, electrical cords, etc? Yes    Adequate lighting in your home to reduce risk of falls? Yes    Life alert? No    Use of a cane, walker or w/c? No    Grab bars in the bathroom? Yes    Shower chair or bench in shower? No    Elevated toilet seat or a handicapped toilet? Yes             TIMED UP AND GO:  Was the test performed?  No    Cognitive Function:        04/10/2023    3:52 PM 01/30/2021  11:24 AM  6CIT Screen  What Year? 0 points 0 points  What month? 0 points 0 points  What time? 0 points 0 points  Count back from 20 0 points 0 points  Months in reverse 0 points 0 points  Repeat phrase 0 points 0 points  Total Score 0 points 0 points    Immunizations Immunization History  Administered Date(s) Administered   PFIZER Comirnaty(Gray Top)Covid-19 Tri-Sucrose Vaccine 02/12/2021, 03/05/2021   Td 06/06/1997   Tdap 10/26/2015    TDAP status: Up to date  Pneumococcal vaccine status: Due, Education has been provided regarding the importance of this vaccine. Advised may receive this vaccine at local pharmacy or Health Dept. Aware to provide a copy of the vaccination record if obtained from local pharmacy or Health Dept.  Verbalized acceptance and understanding.  Covid-19 vaccine status: Information provided on how to obtain vaccines.   Qualifies for Shingles Vaccine? Yes   Zostavax completed No   Shingrix Completed?: No.    Education has been provided regarding the importance of this vaccine. Patient has been advised to call insurance company to determine out of pocket expense if they have not yet received this vaccine. Advised may also receive vaccine at local pharmacy or Health Dept. Verbalized acceptance and understanding.  Screening Tests Health Maintenance  Topic Date Due   OPHTHALMOLOGY EXAM  Never done   Zoster Vaccines- Shingrix (1 of 2) Never done   Pneumonia Vaccine 35+ Years old (1 of 1 - PCV) Never done   DEXA SCAN  Never done   COVID-19 Vaccine (3 - 2023-24 season) 06/06/2022   FOOT EXAM  09/03/2022   HEMOGLOBIN A1C  04/29/2023   INFLUENZA VACCINE  05/07/2023   Diabetic kidney evaluation - eGFR measurement  10/30/2023   Diabetic kidney evaluation - Urine ACR  10/30/2023   Medicare Annual Wellness (AWV)  04/09/2024   MAMMOGRAM  06/03/2024   DTaP/Tdap/Td (3 - Td or Tdap) 10/25/2025   Colonoscopy  06/18/2027   Hepatitis C Screening  Completed   HPV VACCINES  Aged Out    Health Maintenance  Health Maintenance Due  Topic Date Due   OPHTHALMOLOGY EXAM  Never done   Zoster Vaccines- Shingrix (1 of 2) Never done   Pneumonia Vaccine 72+ Years old (1 of 1 - PCV) Never done   DEXA SCAN  Never done   COVID-19 Vaccine (3 - 2023-24 season) 06/06/2022   FOOT EXAM  09/03/2022    Colorectal cancer screening: Type of screening: Colonoscopy. Completed 06/11/17. Repeat every 10 years  Mammogram status: Completed 06/03/22. Repeat every year  Bone Density status: Ordered today. Pt provided with contact info and advised to call to schedule appt.  Lung Cancer Screening: (Low Dose CT Chest recommended if Age 9-80 years, 20 pack-year currently smoking OR have quit w/in 15years.) does not qualify.    Lung Cancer Screening Referral: n/a  Additional Screening:  Hepatitis C Screening: does qualify; Completed 11/19/16  Vision Screening: Recommended annual ophthalmology exams for early detection of glaucoma and other disorders of the eye. Is the patient up to date with their annual eye exam?  Yes  Who is the provider or what is the name of the office in which the patient attends annual eye exams? [facility] If pt is not established with a provider, would they like to be referred to a provider to establish care? No .   Dental Screening: Recommended annual dental exams for proper oral hygiene  Diabetic Foot Exam: Diabetic Foot  Exam: Completed 10/27/22  Community Resource Referral / Chronic Care Management: CRR required this visit?  No   CCM required this visit?  No     Plan:     I have personally reviewed and noted the following in the patient's chart:   Medical and social history Use of alcohol, tobacco or illicit drugs  Current medications and supplements including opioid prescriptions. Patient is not currently taking opioid prescriptions. Functional ability and status Nutritional status Physical activity Advanced directives List of other physicians Hospitalizations, surgeries, and ER visits in previous 12 months Vitals Screenings to include cognitive, depression, and falls Referrals and appointments  In addition, I have reviewed and discussed with patient certain preventive protocols, quality metrics, and best practice recommendations. A written personalized care plan for preventive services as well as general preventive health recommendations were provided to patient.     Kandis Fantasia [place], [place]   10/11/1094   After Visit Summary: (Mail) Due to this being a telephonic visit, the after visit summary with patients personalized plan was offered to patient via mail   Nurse Notes: No concerns

## 2023-04-09 NOTE — Patient Instructions (Addendum)
Annette Rios , Thank you for taking time to come for your Medicare Wellness Visit. I appreciate your ongoing commitment to your health goals. Please review the following plan we discussed and let me know if I can assist you in the future.   These are the goals we discussed:  Goals      HEMOGLOBIN A1C < 7     11.08 August 2020 Counseled on diet and exercise     Remain active and independent        This is a list of the screening recommended for you and due dates:  Health Maintenance  Topic Date Due   Eye exam for diabetics  Never done   DEXA scan (bone density measurement)  Never done   COVID-19 Vaccine (3 - 2023-24 season) 04/26/2023*   Zoster (Shingles) Vaccine (1 of 2) 07/11/2023*   Pneumonia Vaccine (1 of 1 - PCV) 04/09/2024*   Hemoglobin A1C  04/29/2023   Flu Shot  05/07/2023   Complete foot exam   10/28/2023   Yearly kidney function blood test for diabetes  10/30/2023   Yearly kidney health urinalysis for diabetes  10/30/2023   Medicare Annual Wellness Visit  04/09/2024   Mammogram  06/03/2024   DTaP/Tdap/Td vaccine (3 - Td or Tdap) 10/25/2025   Colon Cancer Screening  06/18/2027   Hepatitis C Screening  Completed   HPV Vaccine  Aged Out  *Topic was postponed. The date shown is not the original due date.    Advanced directives: Information on Advanced Care Planning can be found at [facility] Advance Health Care Directives Advance Health Care Directives ([email or web address] Please bring a copy of your health care power of attorney and living will to the office to be added to your chart at your convenience.  Conditions/risks identified: Aim for 30 minutes of exercise or brisk walking, 6-8 glasses of water, and 5 servings of fruits and vegetables each day.  Next appointment: Follow up in one year for your annual wellness visit   You have an order for:  []   2D Mammogram  [x]   3D Mammogram  [x]   Bone Density     Please call for appointment:   [facility] [phone]   Make sure to wear two-piece clothing.  No lotions, powders, or deodorants the day of the appointment. Make sure to bring picture ID and insurance card.  Bring list of medications you are currently taking including any supplements.   Schedule your [place] screening mammogram through MyChart!   Log into your MyChart account.  Go to 'Visit' (or 'Appointments' if on mobile App) --> Schedule an Appointment  Under 'Select a Reason for Visit' choose the Mammogram Screening option.  Complete the pre-visit questions and select the time and place that best fits your schedule.     Preventive Care 69 Years and Older, Female Preventive care refers to lifestyle choices and visits with your health care provider that can promote health and wellness. What does preventive care include? A yearly physical exam. This is also called an annual well check. Dental exams once or twice a year. Routine eye exams. Ask your health care provider how often you should have your eyes checked. Personal lifestyle choices, including: Daily care of your teeth and gums. Regular physical activity. Eating a healthy diet. Avoiding tobacco and drug use. Limiting alcohol use. Practicing safe sex. Taking low-dose aspirin every day. Taking vitamin and mineral supplements as recommended by your health care provider. What  happens during an annual well check? The services and screenings done by your health care provider during your annual well check will depend on your age, overall health, lifestyle risk factors, and family history of disease. Counseling  Your health care provider may ask you questions about your: Alcohol use. Tobacco use. Drug use. Emotional well-being. Home and relationship well-being. Sexual activity. Eating habits. History of falls. Memory and ability to understand (cognition). Work and work Astronomer. Reproductive health. Screening  You may have the following  tests or measurements: Height, weight, and BMI. Blood pressure. Lipid and cholesterol levels. These may be checked every 5 years, or more frequently if you are over 15 years old. Skin check. Lung cancer screening. You may have this screening every year starting at age 83 if you have a 30-pack-year history of smoking and currently smoke or have quit within the past 15 years. Fecal occult blood test (FOBT) of the stool. You may have this test every year starting at age 28. Flexible sigmoidoscopy or colonoscopy. You may have a sigmoidoscopy every 5 years or a colonoscopy every 10 years starting at age 108. Hepatitis C blood test. Hepatitis B blood test. Sexually transmitted disease (STD) testing. Diabetes screening. This is done by checking your blood sugar (glucose) after you have not eaten for a while (fasting). You may have this done every 1-3 years. Bone density scan. This is done to screen for osteoporosis. You may have this done starting at age 52. Mammogram. This may be done every 1-2 years. Talk to your health care provider about how often you should have regular mammograms. Talk with your health care provider about your test results, treatment options, and if necessary, the need for more tests. Vaccines  Your health care provider may recommend certain vaccines, such as: Influenza vaccine. This is recommended every year. Tetanus, diphtheria, and acellular pertussis (Tdap, Td) vaccine. You may need a Td booster every 10 years. Zoster vaccine. You may need this after age 74. Pneumococcal 13-valent conjugate (PCV13) vaccine. One dose is recommended after age 87. Pneumococcal polysaccharide (PPSV23) vaccine. One dose is recommended after age 60. Talk to your health care provider about which screenings and vaccines you need and how often you need them. This information is not intended to replace advice given to you by your health care provider. Make sure you discuss any questions you have with  your health care provider. Document Released: 10/19/2015 Document Revised: 06/11/2016 Document Reviewed: 07/24/2015 Elsevier Interactive Patient Education  2017 [employer].  Fall Prevention in the Home Falls can cause injuries. They can happen to people of all ages. There are many things you can do to make your home safe and to help prevent falls. What can I do on the outside of my home? Regularly fix the edges of walkways and driveways and fix any cracks. Remove anything that might make you trip as you walk through a door, such as a raised step or threshold. Trim any bushes or trees on the path to your home. Use bright outdoor lighting. Clear any walking paths of anything that might make someone trip, such as rocks or tools. Regularly check to see if handrails are loose or broken. Make sure that both sides of any steps have handrails. Any raised decks and porches should have guardrails on the edges. Have any leaves, snow, or ice cleared regularly. Use sand or salt on walking paths during winter. Clean up any spills in your garage right away. This includes oil or grease spills.  What can I do in the bathroom? Use night lights. Install grab bars by the toilet and in the tub and shower. Do not use towel bars as grab bars. Use non-skid mats or decals in the tub or shower. If you need to sit down in the shower, use a plastic, non-slip stool. Keep the floor dry. Clean up any water that spills on the floor as soon as it happens. Remove soap buildup in the tub or shower regularly. Attach bath mats securely with double-sided non-slip rug tape. Do not have throw rugs and other things on the floor that can make you trip. What can I do in the bedroom? Use night lights. Make sure that you have a light by your bed that is easy to reach. Do not use any sheets or blankets that are too big for your bed. They should not hang down onto the floor. Have a firm chair that has side arms. You can use this  for support while you get dressed. Do not have throw rugs and other things on the floor that can make you trip. What can I do in the kitchen? Clean up any spills right away. Avoid walking on wet floors. Keep items that you use a lot in easy-to-reach places. If you need to reach something above you, use a strong step stool that has a grab bar. Keep electrical cords out of the way. Do not use floor polish or wax that makes floors slippery. If you must use wax, use non-skid floor wax. Do not have throw rugs and other things on the floor that can make you trip. What can I do with my stairs? Do not leave any items on the stairs. Make sure that there are handrails on both sides of the stairs and use them. Fix handrails that are broken or loose. Make sure that handrails are as long as the stairways. Check any carpeting to make sure that it is firmly attached to the stairs. Fix any carpet that is loose or worn. Avoid having throw rugs at the top or bottom of the stairs. If you do have throw rugs, attach them to the floor with carpet tape. Make sure that you have a light switch at the top of the stairs and the bottom of the stairs. If you do not have them, ask someone to add them for you. What else can I do to help prevent falls? Wear shoes that: Do not have high heels. Have rubber bottoms. Are comfortable and fit you well. Are closed at the toe. Do not wear sandals. If you use a stepladder: Make sure that it is fully opened. Do not climb a closed stepladder. Make sure that both sides of the stepladder are locked into place. Ask someone to hold it for you, if possible. Clearly mark and make sure that you can see: Any grab bars or handrails. First and last steps. Where the edge of each step is. Use tools that help you move around (mobility aids) if they are needed. These include: Canes. Walkers. Scooters. Crutches. Turn on the lights when you go into a dark area. Replace any light bulbs as  soon as they burn out. Set up your furniture so you have a clear path. Avoid moving your furniture around. If any of your floors are uneven, fix them. If there are any pets around you, be aware of where they are. Review your medicines with your doctor. Some medicines can make you feel dizzy. This can increase your chance  of falling. Ask your doctor what other things that you can do to help prevent falls. This information is not intended to replace advice given to you by your health care provider. Make sure you discuss any questions you have with your health care provider. Document Released: 07/19/2009 Document Revised: 02/28/2016 Document Reviewed: 10/27/2014 Elsevier Interactive Patient Education  2017 [employer].

## 2023-04-10 ENCOUNTER — Ambulatory Visit (INDEPENDENT_AMBULATORY_CARE_PROVIDER_SITE_OTHER): Payer: 59

## 2023-04-10 VITALS — Ht 68.0 in | Wt 178.0 lb

## 2023-04-10 DIAGNOSIS — Z780 Asymptomatic menopausal state: Secondary | ICD-10-CM

## 2023-04-10 DIAGNOSIS — Z0000 Encounter for general adult medical examination without abnormal findings: Secondary | ICD-10-CM

## 2023-04-10 DIAGNOSIS — Z1231 Encounter for screening mammogram for malignant neoplasm of breast: Secondary | ICD-10-CM

## 2023-04-22 ENCOUNTER — Other Ambulatory Visit: Payer: Self-pay | Admitting: Family Medicine

## 2023-04-27 ENCOUNTER — Ambulatory Visit (INDEPENDENT_AMBULATORY_CARE_PROVIDER_SITE_OTHER): Payer: 59 | Admitting: Podiatry

## 2023-04-27 DIAGNOSIS — Z91199 Patient's noncompliance with other medical treatment and regimen due to unspecified reason: Secondary | ICD-10-CM

## 2023-04-27 NOTE — Progress Notes (Signed)
No show

## 2023-05-13 ENCOUNTER — Encounter: Payer: Self-pay | Admitting: Family Medicine

## 2023-05-13 ENCOUNTER — Ambulatory Visit (INDEPENDENT_AMBULATORY_CARE_PROVIDER_SITE_OTHER): Payer: 59 | Admitting: Family Medicine

## 2023-05-13 VITALS — BP 138/74 | HR 71 | Wt 175.0 lb

## 2023-05-13 DIAGNOSIS — I10 Essential (primary) hypertension: Secondary | ICD-10-CM

## 2023-05-13 DIAGNOSIS — E7800 Pure hypercholesterolemia, unspecified: Secondary | ICD-10-CM | POA: Diagnosis not present

## 2023-05-13 DIAGNOSIS — E119 Type 2 diabetes mellitus without complications: Secondary | ICD-10-CM

## 2023-05-13 LAB — POCT GLYCOSYLATED HEMOGLOBIN (HGB A1C): HbA1c, POC (controlled diabetic range): 6.1 % (ref 0.0–7.0)

## 2023-05-13 MED ORDER — METFORMIN HCL ER 500 MG PO TB24: ORAL_TABLET | ORAL | 3 refills | Status: DC

## 2023-05-13 MED ORDER — ROSUVASTATIN CALCIUM 20 MG PO TABS: 20.0000 mg | ORAL_TABLET | Freq: Every day | ORAL | 3 refills | Status: DC

## 2023-05-13 MED ORDER — AMLODIPINE BESYLATE 5 MG PO TABS: 5.0000 mg | ORAL_TABLET | Freq: Every day | ORAL | 3 refills | Status: DC

## 2023-05-13 NOTE — Patient Instructions (Addendum)
Tay on ONE pill a day of the metformin. Continue your other medicines for blood pressure and cholesterol. Let me see you after the Christmas holidays, sooner if you need something.

## 2023-05-14 NOTE — Assessment & Plan Note (Signed)
We had checked her cholesterol 6 months ago so I reviewed those results with her.  No change in medication and will continue current.  Follow-up 6 months.

## 2023-05-14 NOTE — Progress Notes (Signed)
    CHIEF COMPLAINT / HPI: Follow-up chronic problems 1.  Prediabetes mellitus: She has been a little confused about the metformin and was taking it twice a day for a while and then she backed down to once a day.  We had wanted her to back down to once a day so we discussed. 2.  She wonders how her cholesterol is doing.  Taking her statin without medication issues. #3.  Hypertension: No medication issues.  She is taking it daily.  Does need some refills.   PERTINENT  PMH / PSH: I have reviewed the patient's medications, allergies, past medical and surgical history, smoking status and updated in the EMR as appropriate.   OBJECTIVE:  BP 138/74   Pulse 71   Wt 175 lb (79.4 kg)   SpO2 98%   BMI 26.61 kg/m  Vital signs reviewed. GENERAL: Well-developed, well-nourished, no acute distress. CV: Regular rate and rhythm RESPIRATORY: No increased work of breathing NEURO: No gross focal neurological deficits. MSK: Movement of extremity x 4.   ASSESSMENT / PLAN:   HYPERTENSION, BENIGN SYSTEMIC Continue current medications.  We reviewed.  Refills.  HYPERCHOLESTEROLEMIA We had checked her cholesterol 6 months ago so I reviewed those results with her.  No change in medication and will continue current.  Follow-up 6 months.  Type 2 diabetes mellitus without complications (HCC) A1c in excellent shape.  I reiterated that 1 metformin a day is where I want her to be.  Follow-up 6 months   Denny Levy MD

## 2023-05-14 NOTE — Assessment & Plan Note (Signed)
Continue current medications.  We reviewed.  Refills.

## 2023-05-14 NOTE — Assessment & Plan Note (Signed)
A1c in excellent shape.  I reiterated that 1 metformin a day is where I want her to be.  Follow-up 6 months

## 2023-05-23 ENCOUNTER — Other Ambulatory Visit: Payer: Self-pay | Admitting: Family Medicine

## 2023-05-28 ENCOUNTER — Encounter: Payer: Self-pay | Admitting: Family Medicine

## 2023-07-22 ENCOUNTER — Other Ambulatory Visit: Payer: Self-pay | Admitting: Family Medicine

## 2023-09-21 ENCOUNTER — Other Ambulatory Visit: Payer: Self-pay | Admitting: Family Medicine

## 2023-10-08 ENCOUNTER — Other Ambulatory Visit: Payer: Self-pay | Admitting: Family Medicine

## 2023-10-08 ENCOUNTER — Telehealth: Payer: Self-pay

## 2023-10-08 NOTE — Telephone Encounter (Signed)
 Patient LVM on nurse line in regards to Indapamide.   She rpeorts this has not been refilled by PCP yet.   It appears this medication was discontinued.   Will forward to PCP for clarification.

## 2023-10-12 NOTE — Telephone Encounter (Signed)
 Discussed with patient.   Patient voiced understanding.

## 2023-11-03 ENCOUNTER — Ambulatory Visit: Payer: 59

## 2023-11-03 ENCOUNTER — Inpatient Hospital Stay: Admission: RE | Admit: 2023-11-03 | Payer: Medicaid Other | Source: Ambulatory Visit

## 2023-11-04 ENCOUNTER — Other Ambulatory Visit: Payer: Self-pay | Admitting: Family Medicine

## 2023-11-04 DIAGNOSIS — Z1231 Encounter for screening mammogram for malignant neoplasm of breast: Secondary | ICD-10-CM

## 2023-11-04 DIAGNOSIS — Z780 Asymptomatic menopausal state: Secondary | ICD-10-CM

## 2023-11-11 ENCOUNTER — Encounter: Payer: Self-pay | Admitting: Family Medicine

## 2023-11-11 ENCOUNTER — Ambulatory Visit: Payer: 59 | Admitting: Family Medicine

## 2023-11-11 VITALS — BP 135/70 | HR 70 | Ht 68.0 in | Wt 177.0 lb

## 2023-11-11 DIAGNOSIS — K219 Gastro-esophageal reflux disease without esophagitis: Secondary | ICD-10-CM | POA: Diagnosis not present

## 2023-11-11 DIAGNOSIS — Z23 Encounter for immunization: Secondary | ICD-10-CM

## 2023-11-11 DIAGNOSIS — E7800 Pure hypercholesterolemia, unspecified: Secondary | ICD-10-CM | POA: Diagnosis not present

## 2023-11-11 DIAGNOSIS — E119 Type 2 diabetes mellitus without complications: Secondary | ICD-10-CM | POA: Diagnosis not present

## 2023-11-11 DIAGNOSIS — Z0000 Encounter for general adult medical examination without abnormal findings: Secondary | ICD-10-CM | POA: Diagnosis not present

## 2023-11-11 DIAGNOSIS — I10 Essential (primary) hypertension: Secondary | ICD-10-CM

## 2023-11-11 LAB — POCT GLYCOSYLATED HEMOGLOBIN (HGB A1C): HbA1c, POC (controlled diabetic range): 6.2 % (ref 0.0–7.0)

## 2023-11-11 MED ORDER — ESOMEPRAZOLE MAGNESIUM 40 MG PO CPDR: 40.0000 mg | DELAYED_RELEASE_CAPSULE | Freq: Every day | ORAL | 3 refills | Status: AC

## 2023-11-11 MED ORDER — TRAMADOL HCL 50 MG PO TABS: ORAL_TABLET | ORAL | 4 refills | Status: DC

## 2023-11-11 MED ORDER — AMLODIPINE BESYLATE 5 MG PO TABS: 5.0000 mg | ORAL_TABLET | Freq: Every day | ORAL | 3 refills | Status: AC

## 2023-11-11 MED ORDER — METFORMIN HCL ER 500 MG PO TB24: ORAL_TABLET | ORAL | 3 refills | Status: DC

## 2023-11-11 MED ORDER — ROSUVASTATIN CALCIUM 20 MG PO TABS: 20.0000 mg | ORAL_TABLET | Freq: Every day | ORAL | 3 refills | Status: AC

## 2023-11-11 NOTE — Patient Instructions (Signed)
 Let me see you in about 6 months or so. I will send you a note about your labs. I have sent in your refills. Let me know if you need anything, you can send me a MyChart message any time.

## 2023-11-12 LAB — COMPREHENSIVE METABOLIC PANEL
ALT: 20 [IU]/L (ref 0–32)
AST: 20 [IU]/L (ref 0–40)
Albumin: 4.7 g/dL (ref 3.9–4.9)
Alkaline Phosphatase: 42 [IU]/L — ABNORMAL LOW (ref 44–121)
BUN/Creatinine Ratio: 17 (ref 12–28)
BUN: 11 mg/dL (ref 8–27)
Bilirubin Total: 0.6 mg/dL (ref 0.0–1.2)
CO2: 24 mmol/L (ref 20–29)
Calcium: 10 mg/dL (ref 8.7–10.3)
Chloride: 103 mmol/L (ref 96–106)
Creatinine, Ser: 0.65 mg/dL (ref 0.57–1.00)
Globulin, Total: 3 g/dL (ref 1.5–4.5)
Glucose: 91 mg/dL (ref 70–99)
Potassium: 4.6 mmol/L (ref 3.5–5.2)
Sodium: 142 mmol/L (ref 134–144)
Total Protein: 7.7 g/dL (ref 6.0–8.5)
eGFR: 96 mL/min/{1.73_m2} (ref >59)

## 2023-11-12 LAB — LIPID PANEL
Chol/HDL Ratio: 2.1 {ratio} (ref 0.0–4.4)
Cholesterol, Total: 146 mg/dL (ref 100–199)
HDL: 68 mg/dL (ref >39)
LDL Chol Calc (NIH): 66 mg/dL (ref 0–99)
Triglycerides: 55 mg/dL (ref 0–149)
VLDL Cholesterol Cal: 12 mg/dL (ref 5–40)

## 2023-11-12 NOTE — Assessment & Plan Note (Signed)
 Continue statin medication.  Check lipid panel today.

## 2023-11-12 NOTE — Assessment & Plan Note (Addendum)
 Well-controlled.  Will continue amlodipine  5 mg.  Check labs today

## 2023-11-12 NOTE — Assessment & Plan Note (Signed)
 Has done extremely well on Nexium .  In the past we tried to stop it she has almost immediate breakthrough of symptoms so we will refill that today.

## 2023-11-12 NOTE — Progress Notes (Signed)
    CHIEF COMPLAINT / HPI: Checkup on chronic issues: Needs some refills. Doing well.  Taking medicines regularly without any problems.  Not checking blood sugar at home but not having any episodes of hypoglycemia either.   PERTINENT  PMH / PSH: I have reviewed the patient's medications, allergies, past medical and surgical history, smoking status and updated in the EMR as appropriate.   OBJECTIVE:  BP 135/70   Pulse 70   Ht 5' 8 (1.727 m)   Wt 177 lb (80.3 kg)   SpO2 100%   BMI 26.91 kg/m  Vital signs reviewed. GENERAL: Well-developed, well-nourished, no acute distress. CARDIOVASCULAR: Regular rate and rhythm no murmur gallop or rub LUNGS: Clear to auscultation bilaterally, no rales or wheeze. ABDOMEN: Soft positive bowel sounds NEURO: No gross focal neurological deficits. MSK: Movement of extremity x 4.   ASSESSMENT / PLAN:   HYPERTENSION, BENIGN SYSTEMIC Well-controlled.  Will continue amlodipine  5 mg.  Check labs today  Type 2 diabetes mellitus without complications (HCC) A1c today.  Excellent control at 6.2.  Will continue current medications.  GERD (gastroesophageal reflux disease) Has done extremely well on Nexium .  In the past we tried to stop it she has almost immediate breakthrough of symptoms so we will refill that today.  HYPERCHOLESTEROLEMIA Continue statin medication.  Check lipid panel today.  Well adult exam Up todate on colonoscopy and mammogram.  She does not want the flu shot, the COVID shot.  She does agree to the pneumonia shot but reluctantly.  We will give her that today.  Follow-up 6 months for her chronic issues.   Camie Mulch MD

## 2023-11-12 NOTE — Assessment & Plan Note (Addendum)
 Up todate on colonoscopy and mammogram.  She does not want the flu shot, the COVID shot.  She does agree to the pneumonia shot but reluctantly.  We will give her that today.  Follow-up 6 months for her chronic issues.

## 2023-11-12 NOTE — Assessment & Plan Note (Signed)
 A1c today.  Excellent control at 6.2.  Will continue current medications.

## 2023-11-13 ENCOUNTER — Encounter: Payer: Self-pay | Admitting: Family Medicine

## 2023-11-17 ENCOUNTER — Ambulatory Visit (INDEPENDENT_AMBULATORY_CARE_PROVIDER_SITE_OTHER): Payer: 59 | Admitting: Podiatry

## 2023-11-17 ENCOUNTER — Encounter: Payer: Self-pay | Admitting: Podiatry

## 2023-11-17 VITALS — Ht 68.0 in | Wt 177.0 lb

## 2023-11-17 DIAGNOSIS — E119 Type 2 diabetes mellitus without complications: Secondary | ICD-10-CM | POA: Diagnosis not present

## 2023-11-17 DIAGNOSIS — M2141 Flat foot [pes planus] (acquired), right foot: Secondary | ICD-10-CM | POA: Diagnosis not present

## 2023-11-17 DIAGNOSIS — L84 Corns and callosities: Secondary | ICD-10-CM

## 2023-11-17 DIAGNOSIS — M2142 Flat foot [pes planus] (acquired), left foot: Secondary | ICD-10-CM

## 2023-11-17 NOTE — Progress Notes (Signed)
ANNUAL DIABETIC FOOT EXAM  Subjective: Annette Rios presents today for annual diabetic foot exam.  Chief Complaint  Patient presents with   [facility]    She is here for a nail trim, PCP is Dr. Jennette Kettle and "my A1C is fine" and seen last month,.     Patient confirms h/o diabetes.  Patient denies any h/o foot wounds.  Nestor Ramp, MD is patient's PCP.  Past Medical History:  Diagnosis Date   Arthritis    "in my back" (05/28/2016)   Borderline diabetes    Cataract    RIGHT EYE   Chronic lower back pain    Claustrophobia    GERD (gastroesophageal reflux disease)    Headache    "monthly" (05/28/2016)   Heart murmur    High cholesterol    Hypertension    Hypertensive retinopathy    OU   Pre-diabetes    BORDERLINE....WATCHES WHAT SHE EATS   Patient Active Problem List   Diagnosis Date Noted   History of partial colectomy 12/29/2017   Lumbar spine scoliosis 11/20/2017   Well adult exam 10/01/2017   H/O: hysterectomy 11/19/2016   Type 2 diabetes mellitus without complications (HCC) 11/01/2014   GERD (gastroesophageal reflux disease) 11/01/2014   History of colonic polyps 11/01/2014   HYPERCHOLESTEROLEMIA 12/03/2006   DEPRESSION, MAJOR, RECURRENT 12/03/2006   HYPERTENSION, BENIGN SYSTEMIC 12/03/2006   MENOPAUSAL SYNDROME 12/03/2006   BACK PAIN s/p PLIF 2019 12/03/2006   Past Surgical History:  Procedure Laterality Date   BREAST BIOPSY Left    BREAST CYST EXCISION Left    BREAST CYST EXCISION Right    BREAST EXCISIONAL BIOPSY Left    benign   CARPAL TUNNEL RELEASE Bilateral 02/2009 - 2016   right-left   COLON SURGERY  05/2007   Hattie Perch 02/05/2011   COLONOSCOPY W/ BIOPSIES AND POLYPECTOMY     I & D EXTREMITY Right 05/28/2016   "hand"   I & D EXTREMITY Right 05/28/2016   Procedure: IRRIGATION AND DEBRIDEMENT EXTREMITY;  Surgeon: Dairl Ponder, MD;  Location: [facility] OR;  Service: Orthopedics;  Laterality: Right;   LUMBAR FUSION  11/20/2017   VAGINAL HYSTERECTOMY      "had fibroids"   Current Outpatient Medications on File Prior to Visit  Medication Sig Dispense Refill   ACCU-CHEK GUIDE test strip PLEASE USE TO CHECK BLOOD SUGAR THREE TIMES DAILY 100 strip 11   amitriptyline (ELAVIL) 10 MG tablet TAKE 1 TABLET BY MOUTH AT BEDTIME FOR MUSCLE SPASM. MAY. MAKE YOU SLEEPY 30 tablet 3   amLODipine (NORVASC) 5 MG tablet Take 1 tablet (5 mg total) by mouth daily. 90 tablet 3   Blood Glucose Monitoring Suppl (ONETOUCH VERIO) w/Device KIT Use to check blood sugars 3x per day. E11.9 1 kit 0   esomeprazole (NEXIUM) 40 MG capsule Take 1 capsule (40 mg total) by mouth daily. 90 capsule 3   fluticasone (FLONASE) 50 MCG/ACT nasal spray Place 2 sprays into both nostrils daily. 16 g 12   indapamide (LOZOL) 1.25 MG tablet Take 1.25 mg by mouth daily.     metFORMIN (GLUCOPHAGE-XR) 500 MG 24 hr tablet Take one by mouth daily 90 tablet 3   Omega-3 Fatty Acids (FISH OIL) 1000 MG CAPS Take 1 capsule by mouth.     OneTouch Delica Lancets 33G MISC Please use to check blood sugar three times per day. 100 each 12   PREVIDENT 0.2 % SOLN Take by mouth.     rosuvastatin (CRESTOR) 20 MG tablet Take  1 tablet (20 mg total) by mouth daily. 90 tablet 3   traMADol (ULTRAM) 50 MG tablet TAKE 1 TO 2 TABLETS BY MOUTH ONCE OR TWICE DAILY AS NEEDED FOR PAIN 120 tablet 4   triamcinolone cream (KENALOG) 0.1 % Apply to affected area once or twice a day for 2 weeks 30 g 3   No current facility-administered medications on file prior to visit.    Allergies  Allergen Reactions   Penicillins Rash and Other (See Comments)    PATIENT HAS HAD A PCN REACTION WITH IMMEDIATE RASH, FACIAL/TONGUE/THROAT SWELLING, SOB, OR LIGHTHEADEDNESS WITH HYPOTENSION:  #  #  #  YES  #  #  #   Has patient had a PCN reaction causing severe rash involving mucus membranes or skin necrosis: NO Has patient had a PCN reaction that required hospitalization NO Has patient had a PCN reaction occurring within the last 10 years:  NO If all of the above answers are "NO", then may proceed with Cephalosporin use.   Social History   Occupational History   Not on file  Tobacco Use   Smoking status: Never    Passive exposure: Never   Smokeless tobacco: Never  Vaping Use   Vaping status: Never Used  Substance and Sexual Activity   Alcohol use: No   Drug use: No   Sexual activity: Not Currently   Family History  Problem Relation Age of Onset   Hypertension Mother    Hypertension Father    Drug abuse Father    Hypertension Sister    Hypertension Brother    Immunization History  Administered Date(s) Administered   PFIZER Comirnaty(Gray Top)Covid-19 Tri-Sucrose Vaccine 02/12/2021, 03/05/2021   PNEUMOCOCCAL CONJUGATE-20 11/11/2023   Td 06/06/1997   Tdap 10/26/2015     Review of Systems: Negative except as noted in the HPI.   Objective: There were no vitals filed for this visit.  Annette Rios is a pleasant 68 y.o. female in NAD. AAO X 3.  Title   Diabetic Foot Exam - detailed Date & Time: 11/17/2023 11:00 AM Diabetic Foot exam was performed with the following findings: Yes  Visual Foot Exam completed.: Yes  Is there a history of foot ulcer?: No Is there a foot ulcer now?: No Is there swelling?: No Is there elevated skin temperature?: No Is there abnormal foot shape?: Yes (Comment: Pes planus foot deformity b/l.) Is there a claw toe deformity?: No Are the toenails long?: No Are the toenails thick?: No Are the toenails ingrown?: No Is the skin thin, fragile, shiny and hairless?": No Normal Range of Motion?: Yes Is there foot or ankle muscle weakness?: No Do you have pain in calf while walking?: No Are the shoes appropriate in style and fit?: Yes Can the patient see the bottom of their feet?: Yes Pulse Foot Exam completed.: Yes   Right Posterior Tibialis: Present Left posterior Tibialis: Present   Right Dorsalis Pedis: Present Left Dorsalis Pedis: Present     Sensory Foot Exam Completed.:  Yes Semmes-Weinstein Monofilament Test "+" means "has sensation" and "-" means "no sensation"  R Foot Test Control: Pos L Foot Test Control: Pos   R Site 1-Great Toe: Pos L Site 1-Great Toe: Pos   R Site 4: Pos L Site 4: Pos   R site 5: Pos L Site 5: Pos  R Site 6: Pos L Site 6: Pos     Image components are not supported.   Image components are not supported. Image components are  not supported.  [place] Right vibratory: present Left vibratory: present  Comments Preulcerative lesions submet head 1 b/l, sub 5th met base b/l; submet head 5 right foot. No erythema, no edema, no drainage, no fluctuance.     Lab Results  Component Value Date   HGBA1C 6.2 11/11/2023   ADA Risk Categorization: Low Risk :  Patient has all of the following: Intact protective sensation No prior foot ulcer  No severe deformity Pedal pulses present  Assessment: 1. Callus   2. Pes planus of both feet   3. Type 2 diabetes mellitus without complication, without long-term current use of insulin (HCC)   4. Encounter for diabetic foot exam [facility])     Plan: -Patient was evaluated today. All questions/concerns addressed on today's visit. -Diabetic foot examination performed today. -Continue diabetic foot care principles: inspect feet daily, monitor glucose as recommended by PCP and/or Endocrinologist, and follow prescribed diet per PCP, Endocrinologist and/or dietician. -Patient to continue soft, supportive shoe gear daily. -Callus(es) submet head 1 b/l, submet head 5 right foot, and sub 5th met base right foot pared utilizing sterile scalpel blade without complication or incident. Total number debrided =5. -Patient/POA to call should there be question/concern in the interim. Return in about 6 months (around 05/16/2024).  Freddie Breech, DPM      [place] LOCATION: [address] [place] [address]                   Office [phone]    [facility] LOCATION: [address] [place], [place] [address] Office [phone]

## 2024-01-08 ENCOUNTER — Telehealth: Payer: Self-pay

## 2024-01-08 NOTE — Telephone Encounter (Signed)
 Patient calls nurse line requesting a prescription for Vitamin D.   Will forward to PCP.

## 2024-01-11 MED ORDER — CHOLECALCIFEROL 1.25 MG (50000 UT) PO CAPS: 50000.0000 [IU] | ORAL_CAPSULE | Freq: Every day | ORAL | 3 refills | Status: AC

## 2024-04-11 NOTE — Progress Notes (Signed)
 [facility] - Clinic Note  04/12/2024     CHIEF COMPLAINT Patient presents for Retina Follow Up   HISTORY OF PRESENT ILLNESS: Annette Rios is a 68 y.o. female who presents to the clinic today for:   HPI     Retina Follow Up   Diagnosis: Retinoschisis.  In both eyes.  This started 5.  Duration of 1 year.  Since onset it is stable.  I, the attending physician,  performed the HPI with the patient and updated documentation appropriately.        Comments   Pt states no changes in vision, 20/20. Pt denies FOL/floaters/pain. Pt is using Refresh at bedtime OU.   Pt does not recall last A1c but states it was good. Pt monitor BS off and on and it has not been bad.      Last edited by Valdemar Rogue, MD on 04/12/2024  9:08 PM.    pt states she is not having any problems with her vision, pt still follows with Dr. Octavia   Referring physician: Octavia Charlie Hamilton, MD [address] [place],  [place] [address]  HISTORICAL INFORMATION:   Selected notes from the MEDICAL RECORD NUMBER Referred by Dr. Hamilton Octavia for concern of a nevus OD LEE: (S. Groat) [BCVA: OD: OS:] Ocular Hx-cataract OD PMH-arthritis, borderline diabetic, headaches, heart murmur, HLD, HTN    CURRENT MEDICATIONS: No current outpatient medications on file. (Ophthalmic Drugs)   No current facility-administered medications for this visit. (Ophthalmic Drugs)   Current Outpatient Medications (Other)  Medication Sig   ACCU-CHEK GUIDE test strip PLEASE USE TO CHECK BLOOD SUGAR THREE TIMES DAILY   amitriptyline  (ELAVIL ) 10 MG tablet TAKE 1 TABLET BY MOUTH AT BEDTIME FOR MUSCLE SPASM. MAY. MAKE YOU SLEEPY   amLODipine  (NORVASC ) 5 MG tablet Take 1 tablet (5 mg total) by mouth daily.   Blood Glucose Monitoring Suppl (ONETOUCH VERIO) w/Device KIT Use to check blood sugars 3x per day. E11.9   Cholecalciferol  1.25 MG (50000 UT) capsule Take 1 capsule (50,000 Units total) by mouth daily.   esomeprazole   (NEXIUM ) 40 MG capsule Take 1 capsule (40 mg total) by mouth daily.   fluticasone  (FLONASE ) 50 MCG/ACT nasal spray Place 2 sprays into both nostrils daily.   indapamide  (LOZOL ) 1.25 MG tablet Take 1.25 mg by mouth daily.   metFORMIN  (GLUCOPHAGE -XR) 500 MG 24 hr tablet Take one by mouth daily   Omega-3 Fatty Acids (FISH OIL) 1000 MG CAPS Take 1 capsule by mouth.   OneTouch Delica Lancets 33G MISC Please use to check blood sugar three times per day.   PREVIDENT 0.2 % SOLN Take by mouth.   rosuvastatin  (CRESTOR ) 20 MG tablet Take 1 tablet (20 mg total) by mouth daily.   traMADol  (ULTRAM ) 50 MG tablet TAKE 1 TO 2 TABLETS BY MOUTH ONCE OR TWICE DAILY AS NEEDED FOR PAIN   triamcinolone  cream (KENALOG ) 0.1 % Apply to affected area once or twice a day for 2 weeks   No current facility-administered medications for this visit. (Other)   REVIEW OF SYSTEMS: ROS   Positive for: Gastrointestinal, Musculoskeletal, Endocrine, Eyes Negative for: Constitutional, Neurological, Skin, Genitourinary, HENT, Cardiovascular, Respiratory, Psychiatric, Allergic/Imm, Heme/Lymph Last edited by Elnor Avelina RAMAN, COT on 04/12/2024 12:41 PM.     ALLERGIES Allergies  Allergen Reactions   Penicillins Rash and Other (See Comments)    PATIENT HAS HAD A PCN REACTION WITH IMMEDIATE RASH, FACIAL/TONGUE/THROAT SWELLING, SOB, OR LIGHTHEADEDNESS WITH HYPOTENSION:  #  #  #  YES  #  #  #   Has patient had a PCN reaction causing severe rash involving mucus membranes or skin necrosis: NO Has patient had a PCN reaction that required hospitalization NO Has patient had a PCN reaction occurring within the last 10 years: NO If all of the above answers are NO, then may proceed with Cephalosporin use.   PAST MEDICAL HISTORY Past Medical History:  Diagnosis Date   Arthritis    in my back (05/28/2016)   Borderline diabetes    Cataract    RIGHT EYE   Chronic lower back pain    Claustrophobia    GERD (gastroesophageal reflux  disease)    Headache    monthly (05/28/2016)   Heart murmur    High cholesterol    Hypertension    Hypertensive retinopathy    OU   Pre-diabetes    BORDERLINE....WATCHES WHAT SHE EATS   Past Surgical History:  Procedure Laterality Date   BREAST BIOPSY Left    BREAST CYST EXCISION Left    BREAST CYST EXCISION Right    BREAST EXCISIONAL BIOPSY Left    benign   CARPAL TUNNEL RELEASE Bilateral 02/2009 - 2016   right-left   CATARACT EXTRACTION Right    Dr. Octavia   COLON SURGERY  05/2007   thelbert 02/05/2011   COLONOSCOPY W/ BIOPSIES AND POLYPECTOMY     I & D EXTREMITY Right 05/28/2016   hand   I & D EXTREMITY Right 05/28/2016   Procedure: IRRIGATION AND DEBRIDEMENT EXTREMITY;  Surgeon: Donnice Robinsons, MD;  Location: [facility] OR;  Service: Orthopedics;  Laterality: Right;   LUMBAR FUSION  11/20/2017   VAGINAL HYSTERECTOMY     had fibroids   FAMILY HISTORY Family History  Problem Relation Age of Onset   Hypertension Mother    Hypertension Father    Drug abuse Father    Hypertension Sister    Hypertension Brother    SOCIAL HISTORY Social History   Tobacco Use   Smoking status: Never    Passive exposure: Never   Smokeless tobacco: Never  Vaping Use   Vaping status: Never Used  Substance Use Topics   Alcohol use: No   Drug use: No       OPHTHALMIC EXAM:  Base Eye Exam     Visual Acuity (Snellen - Linear)       Right Left   Dist [place] 20/20 -1 20/20 -1         Tonometry (Tonopen, 12:51 PM)       Right Left   Pressure 13 17         Pupils       Pupils Dark Light Shape React APD   Right PERRL 4 2 Round Brisk None   Left PERRL 4 2 Round Brisk None         Visual Fields       Left Right    Full Full         Extraocular Movement       Right Left    Full, Ortho Full, Ortho         Neuro/Psych     Oriented x3: Yes   Mood/Affect: Normal         Dilation     Both eyes: 1.0% Mydriacyl, 2.5% Phenylephrine  @ 12:51 PM            Slit Lamp and Fundus Exam     Slit Lamp Exam       Right  Left   Lids/Lashes Dermatochalasis - upper lid Dermatochalasis - upper lid, mild Meibomian gland dysfunction   Conjunctiva/Sclera mild Melanosis mild Melanosis   Cornea Arcus, well healed cataract wound Arcus   Anterior Chamber deep and clear deep and clear   Iris Round and dilated Round and dilated   Lens PC IOL in good position 2-3+ Nuclear sclerosis, 2-3+ Cortical cataract   Anterior Vitreous mild syneresis, Posterior vitreous detachment mild syneresis         Fundus Exam       Right Left   Disc Pink and Sharp, mild PPP, Compact, mild tilt Pink and Sharp, milt tilt   C/D Ratio 0.4 0.3   Macula Flat, Good foveal reflex, mild ERM, Retinal pigment epithelial mottling, No heme or edema Flat, Good foveal reflex, Retinal pigment epithelial mottling, No heme or edema   Vessels attenuated, Tortuous attenuated, mild tortuosity   Periphery Attached, oval shaped pigmented lesion at 1030 (CHRPE) - stable from prior, shallow schisis ST periphery at 1100 Attached, shallow schisis with inner retinal holes at 0400 -- stable           IMAGING AND PROCEDURES  Imaging and Procedures for @TODAY @  OCT, Retina - OU - Both Eyes       Right Eye Quality was good. Central Foveal Thickness: 277. Progression has been stable. Findings include normal foveal contour, no IRF, no SRF (Peripheral schisis ST quad previously caught on widefield -- not imaged today).   Left Eye Quality was good. Central Foveal Thickness: 276. Progression has been stable. Findings include normal foveal contour, no IRF, no SRF, vitreomacular adhesion (+schisis IT periphery previously caught on widefield -- not imaged today).   Notes *Images captured and stored on drive  Diagnosis / Impression:  NFP, no IRF/SRF OU OD: Peripheral schisis ST quad previously caught on widefield -- not imaged today OS: +schisis IT periphery previously caught on widefield -- not  imaged today; +VMA  Clinical management:  See below  Abbreviations: NFP - Normal foveal profile. CME - cystoid macular edema. PED - pigment epithelial detachment. IRF - intraretinal fluid. SRF - subretinal fluid. EZ - ellipsoid zone. ERM - epiretinal membrane. ORA - outer retinal atrophy. ORT - outer retinal tubulation. SRHM - subretinal hyper-reflective material             ASSESSMENT/PLAN:    ICD-10-CM   1. Congenital hypertrophy of retinal pigment epithelium  Q14.1 OCT, Retina - OU - Both Eyes    2. Bilateral retinoschisis  H33.103 OCT, Retina - OU - Both Eyes    3. Posterior vitreous detachment of right eye  H43.811     4. Essential hypertension  I10     5. Hypertensive retinopathy of both eyes  H35.033     6. Combined forms of age-related cataract of left eye  H25.812     7. Pseudophakia  Z96.1      1. CHRPE OD  - flat, well-demarcated, pigmented lesion located at 1030 -- stable  - no SRF  - optos images obtained (05.04.22) -- stable from prior  - monitor for now  - f/u 1 year, DFE, OCT, OPTOS COLORS/FAF  2. Retinoschisis OU  - OD: shallow schisis ST periphery ~1100  - OS: shallow schisis located at 0400 with inner retinal holes  - no RD and no progression or change from prior   - monitor  - f/u in 1 year, sooner prn  3. PVD / vitreous syneresis OD  - Discussed findings and  prognosis  - No RT or RD on 360 scleral depressed exam  - Reviewed s/s of RT/RD  - Strict return precautions for any such RT/RD signs/symptoms  - monitor  4,5. Hypertensive retinopathy OU  - discussed importance of tight BP control  - monitor  6. Mixed form age related cataracts OS  - The symptoms of cataract, surgical options, and treatments and risks were discussed with patient.   - discussed diagnosis and progression  - under the expert management of Dr. CANDIE Gaudy  - clear from a retina standpoint to proceed with cataract surgery when both patient and surgeon are ready  7.  Pseudophakia OD  - s/p CE/IOL (Dr. Gaudy)  - IOL in good position, doing well  - monitor  Ophthalmic Meds Ordered this visit:  No orders of the defined types were placed in this encounter.    Return in about 1 year (around 04/12/2025) for f/u retinoschisis OU, DFE, OCT.  There are no Patient Instructions on file for this visit.   Explained the diagnoses, plan, and follow up with the patient and they expressed understanding.  Patient expressed understanding of the importance of proper follow up care.   This document serves as a record of services personally performed by Redell JUDITHANN Hans, MD, PhD. It was created on their behalf by Auston Muzzy, COMT. The creation of this record is the provider's dictation and/or activities during the visit.  Electronically signed by: Auston Muzzy, COMT 04/12/24 9:13 PM  This document serves as a record of services personally performed by Redell JUDITHANN Hans, MD, PhD. It was created on their behalf by Alan PARAS. Delores, OA an ophthalmic technician. The creation of this record is the provider's dictation and/or activities during the visit.    Electronically signed by: Alan PARAS. Delores, OA 04/12/24 9:13 PM  Redell JUDITHANN Hans, M.D., Ph.D. Diseases & Surgery of the Retina and Vitreous [facility]  I have reviewed the above documentation for accuracy and completeness, and I agree with the above. Redell JUDITHANN Hans, M.D., Ph.D. 04/12/24 9:13 PM   Abbreviations: M myopia (nearsighted); A astigmatism; H hyperopia (farsighted); P presbyopia; Mrx spectacle prescription;  CTL contact lenses; OD right eye; OS left eye; OU both eyes  XT exotropia; ET esotropia; PEK punctate epithelial keratitis; PEE punctate epithelial erosions; DES dry eye syndrome; MGD meibomian gland dysfunction; ATs artificial tears; PFAT's preservative free artificial tears; NSC nuclear sclerotic cataract; PSC posterior subcapsular cataract; ERM epi-retinal membrane; PVD posterior  vitreous detachment; RD retinal detachment; DM diabetes mellitus; DR diabetic retinopathy; NPDR non-proliferative diabetic retinopathy; PDR proliferative diabetic retinopathy; CSME clinically significant macular edema; DME diabetic macular edema; dbh dot blot hemorrhages; CWS cotton wool spot; POAG primary open angle glaucoma; C/D cup-to-disc ratio; HVF humphrey visual field; GVF goldmann visual field; OCT optical coherence tomography; IOP intraocular pressure; BRVO Branch retinal vein occlusion; CRVO central retinal vein occlusion; CRAO central retinal artery occlusion; BRAO branch retinal artery occlusion; RT retinal tear; SB scleral buckle; PPV pars plana vitrectomy; VH Vitreous hemorrhage; PRP panretinal laser photocoagulation; IVK intravitreal kenalog ; VMT vitreomacular traction; MH Macular hole;  NVD neovascularization of the disc; NVE neovascularization elsewhere; AREDS age related eye disease study; ARMD age related macular degeneration; POAG primary open angle glaucoma; EBMD epithelial/anterior basement membrane dystrophy; ACIOL anterior chamber intraocular lens; IOL intraocular lens; PCIOL posterior chamber intraocular lens; Phaco/IOL phacoemulsification with intraocular lens placement; PRK photorefractive keratectomy; LASIK laser assisted in situ keratomileusis; HTN hypertension; DM diabetes mellitus; COPD chronic obstructive  pulmonary disease

## 2024-04-12 ENCOUNTER — Encounter (INDEPENDENT_AMBULATORY_CARE_PROVIDER_SITE_OTHER): Payer: Self-pay | Admitting: Ophthalmology

## 2024-04-12 ENCOUNTER — Ambulatory Visit (INDEPENDENT_AMBULATORY_CARE_PROVIDER_SITE_OTHER): Payer: 59 | Admitting: Ophthalmology

## 2024-04-12 DIAGNOSIS — H43811 Vitreous degeneration, right eye: Secondary | ICD-10-CM | POA: Diagnosis not present

## 2024-04-12 DIAGNOSIS — H35033 Hypertensive retinopathy, bilateral: Secondary | ICD-10-CM | POA: Diagnosis not present

## 2024-04-12 DIAGNOSIS — Q141 Congenital malformation of retina: Secondary | ICD-10-CM

## 2024-04-12 DIAGNOSIS — Z961 Presence of intraocular lens: Secondary | ICD-10-CM | POA: Diagnosis not present

## 2024-04-12 DIAGNOSIS — H25812 Combined forms of age-related cataract, left eye: Secondary | ICD-10-CM | POA: Diagnosis not present

## 2024-04-12 DIAGNOSIS — I10 Essential (primary) hypertension: Secondary | ICD-10-CM

## 2024-04-12 DIAGNOSIS — H33103 Unspecified retinoschisis, bilateral: Secondary | ICD-10-CM

## 2024-04-14 ENCOUNTER — Ambulatory Visit: Payer: 59

## 2024-04-14 VITALS — Ht 68.0 in | Wt 167.0 lb

## 2024-04-14 DIAGNOSIS — Z0000 Encounter for general adult medical examination without abnormal findings: Secondary | ICD-10-CM | POA: Diagnosis not present

## 2024-04-14 NOTE — Progress Notes (Signed)
 Because this visit was a virtual/telehealth visit,  certain criteria was not obtained, such a blood pressure, CBG if applicable, and timed get up and go. Any medications not marked as taking were not mentioned during the medication reconciliation part of the visit. Any vitals not documented were not able to be obtained due to this being a telehealth visit or patient was unable to self-report a recent blood pressure reading due to a lack of equipment at home via telehealth. Vitals that have been documented are verbally provided by the patient.   Subjective:   Annette Rios is a 68 y.o. who presents for a Medicare Wellness preventive visit.  As a reminder, Annual Wellness Visits don't include a physical exam, and some assessments may be limited, especially if this visit is performed virtually. We may recommend an in-person follow-up visit with your provider if needed.  Visit Complete: Virtual I connected with  Annette Rios on 04/14/24 by a audio enabled telemedicine application and verified that I am speaking with the correct person using two identifiers.  Patient Location: Home  Provider Location: Home Office  I discussed the limitations of evaluation and management by telemedicine. The patient expressed understanding and agreed to proceed.  Vital Signs: Because this visit was a virtual/telehealth visit, some criteria may be missing or patient reported. Any vitals not documented were not able to be obtained and vitals that have been documented are patient reported.  VideoDeclined- This patient declined Librarian, academic. Therefore the visit was completed with audio only.  Persons Participating in Visit: Patient.  AWV Questionnaire: No: Patient Medicare AWV questionnaire was not completed prior to this visit.  Cardiac Risk Factors include: advanced age (>4men, >76 women);diabetes mellitus;dyslipidemia;hypertension;family history of premature  cardiovascular disease     Objective:    Today's Vitals   04/14/24 1154  Weight: 167 lb (75.8 kg)  Height: 5' 8 (1.727 m)  PainSc: 0-No pain   Body mass index is 25.39 kg/m.     04/14/2024   11:55 AM 11/11/2023   10:17 AM 05/13/2023   10:22 AM 04/10/2023    3:52 PM 11/12/2022   10:31 AM 10/29/2022   10:08 AM 02/12/2022   10:29 AM  Advanced Directives  Does Patient Have a Medical Advance Directive? No No No No No No No  Would patient like information on creating a medical advance directive? No - Patient declined No - Patient declined No - Patient declined Yes (MAU/Ambulatory/Procedural Areas - Information given) No - Patient declined No - Patient declined     Current Medications (verified) Outpatient Encounter Medications as of 04/14/2024  Medication Sig   ACCU-CHEK GUIDE test strip PLEASE USE TO CHECK BLOOD SUGAR THREE TIMES DAILY   amitriptyline  (ELAVIL ) 10 MG tablet TAKE 1 TABLET BY MOUTH AT BEDTIME FOR MUSCLE SPASM. MAY. MAKE YOU SLEEPY   amLODipine  (NORVASC ) 5 MG tablet Take 1 tablet (5 mg total) by mouth daily.   Blood Glucose Monitoring Suppl (ONETOUCH VERIO) w/Device KIT Use to check blood sugars 3x per day. E11.9   Cholecalciferol  1.25 MG (50000 UT) capsule Take 1 capsule (50,000 Units total) by mouth daily.   esomeprazole  (NEXIUM ) 40 MG capsule Take 1 capsule (40 mg total) by mouth daily.   fluticasone  (FLONASE ) 50 MCG/ACT nasal spray Place 2 sprays into both nostrils daily.   indapamide  (LOZOL ) 1.25 MG tablet Take 1.25 mg by mouth daily.   metFORMIN  (GLUCOPHAGE -XR) 500 MG 24 hr tablet Take one by mouth daily  Omega-3 Fatty Acids (FISH OIL) 1000 MG CAPS Take 1 capsule by mouth.   OneTouch Delica Lancets 33G MISC Please use to check blood sugar three times per day.   PREVIDENT 0.2 % SOLN Take by mouth.   rosuvastatin  (CRESTOR ) 20 MG tablet Take 1 tablet (20 mg total) by mouth daily.   traMADol  (ULTRAM ) 50 MG tablet TAKE 1 TO 2 TABLETS BY MOUTH ONCE OR TWICE DAILY AS NEEDED  FOR PAIN   triamcinolone  cream (KENALOG ) 0.1 % Apply to affected area once or twice a day for 2 weeks   No facility-administered encounter medications on file as of 04/14/2024.    Allergies (verified) Penicillins   History: Past Medical History:  Diagnosis Date   Arthritis    in my back (05/28/2016)   Borderline diabetes    Cataract    RIGHT EYE   Chronic lower back pain    Claustrophobia    GERD (gastroesophageal reflux disease)    Headache    monthly (05/28/2016)   Heart murmur    High cholesterol    Hypertension    Hypertensive retinopathy    OU   Pre-diabetes    BORDERLINE....WATCHES WHAT SHE EATS   Past Surgical History:  Procedure Laterality Date   BREAST BIOPSY Left    BREAST CYST EXCISION Left    BREAST CYST EXCISION Right    BREAST EXCISIONAL BIOPSY Left    benign   CARPAL TUNNEL RELEASE Bilateral 02/2009 - 2016   right-left   CATARACT EXTRACTION Right    Dr. Octavia   COLON SURGERY  05/2007   thelbert 02/05/2011   COLONOSCOPY W/ BIOPSIES AND POLYPECTOMY     I & D EXTREMITY Right 05/28/2016   hand   I & D EXTREMITY Right 05/28/2016   Procedure: IRRIGATION AND DEBRIDEMENT EXTREMITY;  Surgeon: Donnice Robinsons, MD;  Location: [facility] OR;  Service: Orthopedics;  Laterality: Right;   LUMBAR FUSION  11/20/2017   VAGINAL HYSTERECTOMY     had fibroids   Family History  Problem Relation Age of Onset   Hypertension Mother    Hypertension Father    Drug abuse Father    Hypertension Sister    Hypertension Brother    Social History   Socioeconomic History   Marital status: Divorced    Spouse name: Not on file   Number of children: 2   Years of education: 12   Highest education level: High school graduate  Occupational History   Not on file  Tobacco Use   Smoking status: Never    Passive exposure: Never   Smokeless tobacco: Never  Vaping Use   Vaping status: Never Used  Substance and Sexual Activity   Alcohol use: No   Drug use: No   Sexual  activity: Not Currently  Other Topics Concern   Not on file  Social History Narrative   Patient lives with her niece in [place].    Patient has two sons who live in [place].   Patient goes to the [facility] and works out multiple days a week.    Patient likes to play horse shoes and bowl.   Patient enjoys watching scary movies, soap operas, and westerns.             Social Drivers of Corporate investment banker Strain: Low Risk  (04/14/2024)   Overall Financial Resource Strain (CARDIA)    Difficulty of Paying Living Expenses: Not hard at all  Food Insecurity: No Food Insecurity (04/14/2024)   Hunger Vital  Sign    Worried About Programme researcher, broadcasting/film/video in the Last Year: Never true    Ran Out of Food in the Last Year: Never true  Transportation Needs: No Transportation Needs (04/14/2024)   PRAPARE - Administrator, Civil Service (Medical): No    Lack of Transportation (Non-Medical): No  Physical Activity: Sufficiently Active (04/14/2024)   Exercise Vital Sign    Days of Exercise per Week: 5 days    Minutes of Exercise per Session: 30 min  Stress: No Stress Concern Present (04/14/2024)   [employer] of Occupational Health - Occupational Stress Questionnaire    Feeling of Stress: Not at all  Social Connections: Moderately Isolated (04/14/2024)   Social Connection and Isolation Panel    Frequency of Communication with Friends and Family: More than three times a week    Frequency of Social Gatherings with Friends and Family: Three times a week    Attends Religious Services: 1 to 4 times per year    Active Member of Clubs or Organizations: No    Attends Banker Meetings: Never    Marital Status: Divorced    Tobacco Counseling Counseling given: Not Answered    Clinical Intake:  Pre-visit preparation completed: Yes  Pain : No/denies pain Pain Score: 0-No pain     BMI - recorded: 25.39 Nutritional Status: BMI 25 -29 Overweight Nutritional Risks:  None Diabetes: Yes CBG done?: No Did pt. bring in CBG monitor from home?: No  Lab Results  Component Value Date   HGBA1C 6.2 11/11/2023   HGBA1C 6.1 05/13/2023   HGBA1C 6.5 10/29/2022     How often do you need to have someone help you when you read instructions, pamphlets, or other written materials from your doctor or pharmacy?: 1 - Never  Interpreter Needed?: No  Information entered by :: Laneah Luft N. Aahana Elza, LPN.   Activities of Daily Living     04/14/2024   11:58 AM  In your present state of health, do you have any difficulty performing the following activities:  Hearing? 0  Vision? 0  Difficulty concentrating or making decisions? 0  Comment BSE: games on phone, crossword books  Walking or climbing stairs? 0  Dressing or bathing? 0  Doing errands, shopping? 0  Preparing Food and eating ? N  Using the Toilet? N  In the past six months, have you accidently leaked urine? N  Do you have problems with loss of bowel control? N  Managing your Medications? N  Managing your Finances? N  Housekeeping or managing your Housekeeping? N    Patient Care Team: Rosalynn Camie CROME, MD as PCP - General (Family Medicine) Valdemar Rogue, MD as Consulting Physician (Ophthalmology) Joya Stabs, DPM as Consulting Physician (Podiatry) Octavia, Charlie Hamilton, MD as Consulting Physician (Ophthalmology)  I have updated your Care Teams any recent Medical Services you may have received from other providers in the past year.     Assessment:   This is a routine wellness examination for Jovanna.  Hearing/Vision screen Hearing Screening - Comments:: Denies hearing difficulties.  Vision Screening - Comments:: Wears rx glasses - up to date with routine eye exams with Charlie Hamilton Octavia, MD and Rogue Valdemar, MD.     Goals Addressed             This Visit's Progress    04/14/2024: To maintain my health by staying independent, active and keeping up with my screenings.         Depression  Screen     04/14/2024   12:00 PM 05/13/2023   10:23 AM 04/10/2023    3:51 PM 11/12/2022   10:34 AM 10/29/2022   10:08 AM 02/12/2022   10:29 AM 08/14/2021    9:42 AM  PHQ 2/9 Scores  PHQ - 2 Score 0 0 0 0 0 0 1  PHQ- 9 Score 0 2  2 0  2    Fall Risk     04/14/2024   11:57 AM 05/13/2023   10:23 AM 04/10/2023    3:51 PM 11/12/2022   10:32 AM 10/29/2022   10:08 AM  Fall Risk   Falls in the past year? 0 0 0 0 0  Number falls in past yr: 0 0 0    Injury with Fall? 0 0 0    Risk for fall due to : No Fall Risks No Fall Risks No Fall Risks    Follow up Falls evaluation completed Falls prevention discussed Falls prevention discussed;Education provided;Falls evaluation completed      MEDICARE RISK AT HOME:  Medicare Risk at Home Any stairs in or around the home?: Yes (Front and Back Entrance 4-5 staeps) If so, are there any without handrails?: No Home free of loose throw rugs in walkways, pet beds, electrical cords, etc?: Yes Adequate lighting in your home to reduce risk of falls?: Yes Life alert?: No Use of a cane, walker or w/c?: No Grab bars in the bathroom?: No Shower chair or bench in shower?: No Elevated toilet seat or a handicapped toilet?: Yes  TIMED UP AND GO:  Was the test performed?  No  Cognitive Function: Declined/Normal: No cognitive concerns noted by patient or family. Patient alert, oriented, able to answer questions appropriately and recall recent events. No signs of memory loss or confusion.    04/14/2024   12:00 PM  MMSE - Mini Mental State Exam  Not completed: Unable to complete        04/14/2024   11:57 AM 04/10/2023    3:52 PM 01/30/2021   11:24 AM  6CIT Screen  What Year? 0 points 0 points 0 points  What month? 0 points 0 points 0 points  What time? 0 points 0 points 0 points  Count back from 20 0 points 0 points 0 points  Months in reverse 0 points 0 points 0 points  Repeat phrase 0 points 0 points 0 points  Total Score 0 points 0 points 0 points     Immunizations Immunization History  Administered Date(s) Administered   PFIZER Comirnaty(Gray Top)Covid-19 Tri-Sucrose Vaccine 02/12/2021, 03/05/2021   PNEUMOCOCCAL CONJUGATE-20 11/11/2023   Td 06/06/1997   Tdap 10/26/2015    Screening Tests Health Maintenance  Topic Date Due   Zoster Vaccines- Shingrix (1 of 2) Never done   DEXA SCAN  Never done   COVID-19 Vaccine (3 - 2024-25 season) 06/07/2023   Diabetic kidney evaluation - Urine ACR  10/30/2023   INFLUENZA VACCINE  05/06/2024   HEMOGLOBIN A1C  05/10/2024   OPHTHALMOLOGY EXAM  05/27/2024   MAMMOGRAM  06/03/2024   Diabetic kidney evaluation - eGFR measurement  11/10/2024   FOOT EXAM  11/16/2024   Medicare Annual Wellness (AWV)  04/14/2025   DTaP/Tdap/Td (3 - Td or Tdap) 10/25/2025   Colonoscopy  06/18/2027   Pneumococcal Vaccine: 50+ Years  Completed   Hepatitis C Screening  Completed   Hepatitis B Vaccines  Aged Out   HPV VACCINES  Aged Out   Meningococcal B Vaccine  Aged Out  Health Maintenance  Health Maintenance Due  Topic Date Due   Zoster Vaccines- Shingrix (1 of 2) Never done   DEXA SCAN  Never done   COVID-19 Vaccine (3 - 2024-25 season) 06/07/2023   Diabetic kidney evaluation - Urine ACR  10/30/2023   Health Maintenance Items Addressed: Yes Patient aware of current care gaps.  Immunization record was verified by [employer].  Patient is due for Shingrix, Covid vaccine and Diabetic Kidney Evaluation-Urine ACR.  Additional Screening:  Vision Screening: Recommended annual ophthalmology exams for early detection of glaucoma and other disorders of the eye. Would you like a referral to an eye doctor? No    Dental Screening: Recommended annual dental exams for proper oral hygiene  Community Resource Referral / Chronic Care Management: CRR required this visit?  No   CCM required this visit?  No   Plan:    I have personally reviewed and noted the following in the patient's chart:   Medical and social  history Use of alcohol, tobacco or illicit drugs  Current medications and supplements including opioid prescriptions. Patient is not currently taking opioid prescriptions. Functional ability and status Nutritional status Physical activity Advanced directives List of other physicians Hospitalizations, surgeries, and ER visits in previous 12 months Vitals Screenings to include cognitive, depression, and falls Referrals and appointments  In addition, I have reviewed and discussed with patient certain preventive protocols, quality metrics, and best practice recommendations. A written personalized care plan for preventive services as well as general preventive health recommendations were provided to patient.   Roz LOISE Fuller, LPN   2/89/7974   After Visit Summary: (MyChart) Due to this being a telephonic visit, the after visit summary with patients personalized plan was offered to patient via MyChart   Notes: Patient aware of current care gaps.  Immunization record was verified by [employer].  Patient is due for Shingrix, Covid vaccine and Diabetic Kidney Evaluation-Urine ACR.

## 2024-04-14 NOTE — Patient Instructions (Signed)
 Annette Rios , Thank you for taking time out of your busy schedule to complete your Annual Wellness Visit with me. I enjoyed our conversation and look forward to speaking with you again next year. I, as well as your care team,  appreciate your ongoing commitment to your health goals. Please review the following plan we discussed and let me know if I can assist you in the future. Your Game plan/ To Do List    Referrals: If you haven't heard from the office you've been referred to, please reach out to them at the phone provided.   Follow up Visits: Next Medicare AWV with our clinical staff: 04/20/2025 at 11:50 a.m phone visit with Nurse Health Advisor   Have you seen your provider in the last 6 months (3 months if uncontrolled diabetes)? No Next Office Visit with your provider: 05/11/2024 at 10:15 a.m. office visit with Dr. Rosalynn  Clinician Recommendations:  Aim for 30 minutes of exercise or brisk walking, 6-8 glasses of water, and 5 servings of fruits and vegetables each day.       This is a list of the screening recommended for you and due dates:  Health Maintenance  Topic Date Due   Zoster (Shingles) Vaccine (1 of 2) Never done   DEXA scan (bone density measurement)  Never done   COVID-19 Vaccine (3 - 2024-25 season) 06/07/2023   Yearly kidney health urinalysis for diabetes  10/30/2023   Flu Shot  05/06/2024   Hemoglobin A1C  05/10/2024   Eye exam for diabetics  05/27/2024   Mammogram  06/03/2024   Yearly kidney function blood test for diabetes  11/10/2024   Complete foot exam   11/16/2024   Medicare Annual Wellness Visit  04/14/2025   DTaP/Tdap/Td vaccine (3 - Td or Tdap) 10/25/2025   Colon Cancer Screening  06/18/2027   Pneumococcal Vaccine for age over 17  Completed   Hepatitis C Screening  Completed   Hepatitis B Vaccine  Aged Out   HPV Vaccine  Aged Out   Meningitis B Vaccine  Aged Out    Advanced directives: (Declined) Advance directive discussed with you today. Even though you  declined this today, please call our office should you change your mind, and we can give you the proper paperwork for you to fill out. Advance Care Planning is important because it:  [x]  Makes sure you receive the medical care that is consistent with your values, goals, and preferences  [x]  It provides guidance to your family and loved ones and reduces their decisional burden about whether or not they are making the right decisions based on your wishes.  Follow the link provided in your after visit summary or read over the paperwork we have mailed to you to help you started getting your Advance Directives in place. If you need assistance in completing these, please reach out to us  so that we can help you!  See attachments for Preventive Care and Fall Prevention Tips.

## 2024-04-19 NOTE — Progress Notes (Signed)
I have reviewed this visit and agree with the documentation.   

## 2024-04-25 ENCOUNTER — Other Ambulatory Visit: Payer: Self-pay | Admitting: Family Medicine

## 2024-05-11 ENCOUNTER — Ambulatory Visit (INDEPENDENT_AMBULATORY_CARE_PROVIDER_SITE_OTHER): Admitting: Family Medicine

## 2024-05-11 VITALS — BP 130/70 | HR 77 | Ht 68.0 in | Wt 180.4 lb

## 2024-05-11 DIAGNOSIS — E7800 Pure hypercholesterolemia, unspecified: Secondary | ICD-10-CM

## 2024-05-11 DIAGNOSIS — I10 Essential (primary) hypertension: Secondary | ICD-10-CM | POA: Diagnosis not present

## 2024-05-11 DIAGNOSIS — E119 Type 2 diabetes mellitus without complications: Secondary | ICD-10-CM | POA: Diagnosis not present

## 2024-05-11 LAB — POCT GLYCOSYLATED HEMOGLOBIN (HGB A1C): HbA1c, POC (controlled diabetic range): 6.2 % (ref 0.0–7.0)

## 2024-05-11 NOTE — Patient Instructions (Signed)
 I will send you a note about your labs. You look great!  Vitamin D3, and take about 100 mg daily.

## 2024-05-12 NOTE — Assessment & Plan Note (Signed)
 Excellent control will continue current medications.  Check some labs today.  Follow-up hypertension 4 to 6 months.  Sooner with problems.

## 2024-05-12 NOTE — Assessment & Plan Note (Signed)
 Try to follow a really good diet and has not been checking of her blood sugars but she has had no symptomatic low blood sugars.

## 2024-05-12 NOTE — Progress Notes (Signed)
    CHIEF COMPLAINT / HPI: Here for checkup of chronic issues and update on health maintenance.  No particular complaints.  Taking medicines regularly, needs a couple of refills.  No problems.  Remains very active.  Wants to look at the weight we have registered for her over the last year or so see if she is stable.   PERTINENT  PMH / PSH: I have reviewed the patient's medications, allergies, past medical and surgical history, smoking status and updated in the EMR as appropriate.   OBJECTIVE:  BP 130/70   Pulse 77   Ht 5' 8 (1.727 m)   Wt 180 lb 6.4 oz (81.8 kg)   SpO2 98%   BMI 27.43 kg/m  Vital signs reviewed. GENERAL: Well-developed, well-nourished, no acute distress. CARDIOVASCULAR: Regular rate and rhythm no murmur gallop or rub LUNGS: Clear to auscultation bilaterally, no rales or wheeze. ABDOMEN: Soft positive bowel sounds NEURO: No gross focal neurological deficits. MSK: Movement of extremity x 4.   ASSESSMENT / PLAN:   HYPERTENSION, BENIGN SYSTEMIC Excellent control will continue current medications.  Check some labs today.  Follow-up hypertension 4 to 6 months.  Sooner with problems.  HYPERCHOLESTEROLEMIA Continue current cholesterol medicines  We just checked her cholesterol about 6 months ago so I will not check that today we will check LFTs.  Type 2 diabetes mellitus without complications (HCC) Try to follow a really good diet and has not been checking of her blood sugars but she has had no symptomatic low blood sugars.   Camie Mulch MD

## 2024-05-12 NOTE — Assessment & Plan Note (Addendum)
 Continue current cholesterol medicines  We just checked her cholesterol about 6 months ago so I will not check that today we will check LFTs.

## 2024-05-13 ENCOUNTER — Ambulatory Visit: Payer: Self-pay | Admitting: Family Medicine

## 2024-05-13 LAB — COMPREHENSIVE METABOLIC PANEL WITH GFR
ALT: 25 IU/L (ref 0–32)
AST: 21 IU/L (ref 0–40)
Albumin: 4.5 g/dL (ref 3.9–4.9)
Alkaline Phosphatase: 43 IU/L — ABNORMAL LOW (ref 44–121)
BUN/Creatinine Ratio: 15 (ref 12–28)
BUN: 10 mg/dL (ref 8–27)
Bilirubin Total: 0.4 mg/dL (ref 0.0–1.2)
CO2: 26 mmol/L (ref 20–29)
Calcium: 9.7 mg/dL (ref 8.7–10.3)
Chloride: 102 mmol/L (ref 96–106)
Creatinine, Ser: 0.68 mg/dL (ref 0.57–1.00)
Globulin, Total: 2.8 g/dL (ref 1.5–4.5)
Glucose: 94 mg/dL (ref 70–99)
Potassium: 4.5 mmol/L (ref 3.5–5.2)
Sodium: 142 mmol/L (ref 134–144)
Total Protein: 7.3 g/dL (ref 6.0–8.5)
eGFR: 94 mL/min/1.73 (ref >59)

## 2024-05-13 LAB — PROTEIN / CREATININE RATIO, URINE
Creatinine, Urine: 110.3 mg/dL
Protein, Ur: 19.6 mg/dL
Protein/Creat Ratio: 178 mg/g{creat} (ref 0–200)

## 2024-05-17 ENCOUNTER — Ambulatory Visit (INDEPENDENT_AMBULATORY_CARE_PROVIDER_SITE_OTHER): Payer: 59 | Admitting: Podiatry

## 2024-05-17 ENCOUNTER — Encounter: Payer: Self-pay | Admitting: Podiatry

## 2024-05-17 DIAGNOSIS — L84 Corns and callosities: Secondary | ICD-10-CM | POA: Diagnosis not present

## 2024-05-17 DIAGNOSIS — E119 Type 2 diabetes mellitus without complications: Secondary | ICD-10-CM | POA: Diagnosis not present

## 2024-05-17 NOTE — Progress Notes (Signed)
 Subjective:  Patient ID: Annette Rios, female    DOB: Aug 29, 1956,  MRN: [id number]  Annette Rios presents to clinic today for preventative diabetic foot care and callus(es) of both feet. Aggravating factors include weightbearing with and without shoe gear. Pain is relieved with periodic professional debridement.  Chief Complaint  Patient presents with   [facility]    Rm16 Diabetic foot care Dr. Rosalynn last visit August 2025   New problem(s): None.   PCP is Rosalynn Camie CROME, MD.  Allergies  Allergen Reactions   Penicillins Rash and Other (See Comments)    PATIENT HAS HAD A PCN REACTION WITH IMMEDIATE RASH, FACIAL/TONGUE/THROAT SWELLING, SOB, OR LIGHTHEADEDNESS WITH HYPOTENSION:  #  #  #  YES  #  #  #   Has patient had a PCN reaction causing severe rash involving mucus membranes or skin necrosis: NO Has patient had a PCN reaction that required hospitalization NO Has patient had a PCN reaction occurring within the last 10 years: NO If all of the above answers are NO, then may proceed with Cephalosporin use.    Review of Systems: Negative except as noted in the HPI.  Objective: No changes noted in today's physical examination. There were no vitals filed for this visit. Annette Rios is a pleasant 68 y.o. female WD, WN in NAD. AAO x 3.  Vascular Examination: Capillary refill time immediate b/l. Vascular status intact b/l with palpable pedal pulses. Pedal hair present b/l. No pain with calf compression b/l. Skin temperature gradient WNL b/l. No cyanosis or clubbing b/l. No ischemia or gangrene noted b/l. No edema noted b/l LE.  Neurological Examination: Sensation grossly intact b/l with 10 gram monofilament. Vibratory sensation intact b/l.   Dermatological Examination: Pedal skin with normal turgor, texture and tone b/l.  No open wounds. No interdigital macerations.   Toenails recently debrided. Hyperkeratotic lesion(s) submet head 1 left foot, submet head 5 b/l, sub 5th met base  left foot, and sub 5th met base right foot.  No erythema, no edema, no drainage, no fluctuance.  Musculoskeletal Examination: Muscle strength 5/5 to all lower extremity muscle groups bilaterally. Pes planus deformity noted bilateral LE.SABRA No pain, crepitus or joint limitation noted with ROM b/l LE.  Patient ambulates independently without assistive aids.  Radiographs: None  Last A1c:      Latest Ref Rng & Units 05/11/2024   11:50 AM 11/11/2023   11:35 AM  Hemoglobin A1C  Hemoglobin-A1c 0.0 - 7.0 % 6.2  6.2    Assessment/Plan: 1. Callus   2. Type 2 diabetes mellitus without complication, without long-term current use of insulin  (HCC)     -Consent given for treatment as described below: -Examined patient. -Patient to continue soft, supportive shoe gear daily. -Callus(es) submet head 1 b/l, submet head 5 b/l, sub 5th met base left foot, and sub 5th met base right foot pared utilizing sterile scalpel blade without complication or incident. Total number debrided =6. -Patient/POA to call should there be question/concern in the interim.   Return in about 3 months (around 08/17/2024).  Delon CROME Merlin, DPM      [facility] LOCATION: [address], [place] [address]  Office [phone]   [facility] LOCATION: [address] [place], [place] [address] Office [phone]

## 2024-06-24 ENCOUNTER — Ambulatory Visit
Admission: RE | Admit: 2024-06-24 | Discharge: 2024-06-24 | Disposition: A | Discharge status: Home / Self Care | Payer: 59 | Source: Ambulatory Visit | Attending: Family Medicine | Admitting: Family Medicine

## 2024-06-24 ENCOUNTER — Other Ambulatory Visit: Payer: 59

## 2024-06-24 DIAGNOSIS — Z1231 Encounter for screening mammogram for malignant neoplasm of breast: Secondary | ICD-10-CM | POA: Diagnosis not present

## 2024-07-04 ENCOUNTER — Other Ambulatory Visit: Payer: Self-pay

## 2024-07-05 MED ORDER — TRIAMCINOLONE ACETONIDE 0.1 % EX CREA: TOPICAL_CREAM | CUTANEOUS | 3 refills | Status: AC

## 2024-07-05 MED ORDER — AMITRIPTYLINE HCL 10 MG PO TABS: ORAL_TABLET | ORAL | 3 refills | Status: AC

## 2024-07-05 MED ORDER — TRAMADOL HCL 50 MG PO TABS: ORAL_TABLET | ORAL | 4 refills | Status: AC

## 2024-07-05 MED ORDER — FLUTICASONE PROPIONATE 50 MCG/ACT NA SUSP: 2.0000 | Freq: Every day | NASAL | 12 refills | Status: AC

## 2024-07-20 ENCOUNTER — Other Ambulatory Visit (HOSPITAL_BASED_OUTPATIENT_CLINIC_OR_DEPARTMENT_OTHER)

## 2024-08-24 NOTE — Progress Notes (Signed)
 Annette Rios                                          MRN: [id number]   08/24/2024   The [facility] Quality Team Specialist reviewed this patient medical record for the purposes of chart review for care gap closure. The following were reviewed: chart review for care gap closure-kidney health evaluation for diabetes:eGFR  and uACR.    [facility]I Quality Team

## 2024-08-24 NOTE — Progress Notes (Signed)
 Annette Rios                                          MRN: [id number]   08/24/2024   The [facility]BCI Quality Team Specialist reviewed this patient medical record for the purposes of chart review for care gap closure. The following were reviewed: abstraction for care gap closure-glycemic status assessment.    [facility]BCI Quality Team

## 2024-08-31 ENCOUNTER — Ambulatory Visit: Admitting: Podiatry

## 2024-09-14 NOTE — Progress Notes (Signed)
 Annette Rios                                          MRN: [id number]   09/14/2024   The [facility]BCI Quality Team Specialist reviewed this patient medical record for the purposes of chart review for care gap closure. The following were reviewed: chart review for care gap closure-kidney health evaluation for diabetes:eGFR  and uACR.    [facility]I Quality Team

## 2024-11-02 ENCOUNTER — Ambulatory Visit (HOSPITAL_BASED_OUTPATIENT_CLINIC_OR_DEPARTMENT_OTHER)
Admission: RE | Admit: 2024-11-02 | Discharge: 2024-11-02 | Disposition: A | Discharge status: Home / Self Care | Source: Ambulatory Visit | Attending: Family Medicine | Admitting: Family Medicine

## 2024-11-02 DIAGNOSIS — Z780 Asymptomatic menopausal state: Secondary | ICD-10-CM | POA: Diagnosis present

## 2024-11-15 ENCOUNTER — Ambulatory Visit: Admitting: Podiatry

## 2025-04-11 ENCOUNTER — Encounter (INDEPENDENT_AMBULATORY_CARE_PROVIDER_SITE_OTHER): Admitting: Ophthalmology

## 2025-04-17 ENCOUNTER — Encounter
